# Patient Record
Sex: Male | Born: 1965 | Race: White | Hispanic: No | Smoking: Current every day smoker
Health system: Southern US, Community
[De-identification: ages and names within clinical notes are randomized; demographics above are authoritative.]

## PROBLEM LIST (undated history)

## (undated) DIAGNOSIS — I214 Non-ST elevation (NSTEMI) myocardial infarction: Secondary | ICD-10-CM

## (undated) DIAGNOSIS — Z9861 Coronary angioplasty status: Secondary | ICD-10-CM

## (undated) DIAGNOSIS — K219 Gastro-esophageal reflux disease without esophagitis: Secondary | ICD-10-CM

## (undated) DIAGNOSIS — F32A Depression, unspecified: Secondary | ICD-10-CM

## (undated) DIAGNOSIS — Z87442 Personal history of urinary calculi: Secondary | ICD-10-CM

## (undated) DIAGNOSIS — I739 Peripheral vascular disease, unspecified: Secondary | ICD-10-CM

## (undated) DIAGNOSIS — I2510 Atherosclerotic heart disease of native coronary artery without angina pectoris: Secondary | ICD-10-CM

## (undated) DIAGNOSIS — K649 Unspecified hemorrhoids: Secondary | ICD-10-CM

## (undated) DIAGNOSIS — E785 Hyperlipidemia, unspecified: Secondary | ICD-10-CM

## (undated) DIAGNOSIS — F329 Major depressive disorder, single episode, unspecified: Secondary | ICD-10-CM

## (undated) DIAGNOSIS — I10 Essential (primary) hypertension: Secondary | ICD-10-CM

## (undated) DIAGNOSIS — F419 Anxiety disorder, unspecified: Secondary | ICD-10-CM

## (undated) HISTORY — DX: Essential (primary) hypertension: I10

## (undated) HISTORY — PX: KIDNEY STONE SURGERY: SHX686

## (undated) HISTORY — DX: Hyperlipidemia, unspecified: E78.5

## (undated) HISTORY — DX: Anxiety disorder, unspecified: F41.9

## (undated) HISTORY — PX: ELBOW SURGERY: SHX618

## (undated) HISTORY — DX: Depression, unspecified: F32.A

## (undated) HISTORY — DX: Major depressive disorder, single episode, unspecified: F32.9

---

## 1998-08-04 ENCOUNTER — Encounter: Payer: Self-pay | Admitting: Emergency Medicine

## 1998-08-04 ENCOUNTER — Emergency Department (HOSPITAL_COMMUNITY): Admission: EM | Admit: 1998-08-04 | Discharge: 1998-08-04 | Payer: Self-pay | Admitting: Emergency Medicine

## 1998-09-30 ENCOUNTER — Ambulatory Visit (HOSPITAL_COMMUNITY): Admission: RE | Admit: 1998-09-30 | Discharge: 1998-09-30 | Payer: Self-pay | Admitting: Urology

## 1998-09-30 ENCOUNTER — Encounter: Payer: Self-pay | Admitting: Urology

## 2002-08-21 ENCOUNTER — Encounter: Payer: Self-pay | Admitting: Emergency Medicine

## 2002-08-21 ENCOUNTER — Encounter: Admission: RE | Admit: 2002-08-21 | Discharge: 2002-08-21 | Payer: Self-pay | Admitting: Emergency Medicine

## 2007-12-11 ENCOUNTER — Encounter: Admission: RE | Admit: 2007-12-11 | Discharge: 2007-12-11 | Payer: Self-pay | Admitting: Sports Medicine

## 2008-06-22 ENCOUNTER — Encounter: Admission: RE | Admit: 2008-06-22 | Discharge: 2008-06-22 | Payer: Self-pay | Admitting: Internal Medicine

## 2008-07-18 ENCOUNTER — Ambulatory Visit: Payer: Self-pay | Admitting: Diagnostic Radiology

## 2008-07-18 ENCOUNTER — Emergency Department (HOSPITAL_BASED_OUTPATIENT_CLINIC_OR_DEPARTMENT_OTHER): Admission: EM | Admit: 2008-07-18 | Discharge: 2008-07-18 | Payer: Self-pay | Admitting: Emergency Medicine

## 2008-07-19 ENCOUNTER — Ambulatory Visit: Payer: Self-pay | Admitting: Surgery

## 2008-07-20 ENCOUNTER — Ambulatory Visit (HOSPITAL_COMMUNITY): Admission: AD | Admit: 2008-07-20 | Discharge: 2008-07-20 | Payer: Self-pay | Admitting: Urology

## 2008-07-27 ENCOUNTER — Ambulatory Visit (HOSPITAL_BASED_OUTPATIENT_CLINIC_OR_DEPARTMENT_OTHER): Admission: RE | Admit: 2008-07-27 | Discharge: 2008-07-27 | Payer: Self-pay | Admitting: Urology

## 2008-09-24 ENCOUNTER — Ambulatory Visit (HOSPITAL_COMMUNITY): Payer: Self-pay | Admitting: Licensed Clinical Social Worker

## 2008-10-07 ENCOUNTER — Ambulatory Visit (HOSPITAL_COMMUNITY): Payer: Self-pay | Admitting: Licensed Clinical Social Worker

## 2008-10-12 ENCOUNTER — Ambulatory Visit (HOSPITAL_COMMUNITY): Payer: Self-pay | Admitting: Psychiatry

## 2008-11-17 ENCOUNTER — Ambulatory Visit (HOSPITAL_COMMUNITY): Payer: Self-pay | Admitting: Psychiatry

## 2009-01-12 ENCOUNTER — Ambulatory Visit (HOSPITAL_COMMUNITY): Payer: Self-pay | Admitting: Psychiatry

## 2009-06-08 ENCOUNTER — Ambulatory Visit (HOSPITAL_COMMUNITY): Payer: Self-pay | Admitting: Psychiatry

## 2009-09-30 ENCOUNTER — Ambulatory Visit (HOSPITAL_COMMUNITY): Payer: Self-pay | Admitting: Psychiatry

## 2009-12-21 ENCOUNTER — Emergency Department (HOSPITAL_COMMUNITY): Admission: EM | Admit: 2009-12-21 | Discharge: 2009-12-21 | Payer: Self-pay | Admitting: Emergency Medicine

## 2010-03-21 ENCOUNTER — Encounter (HOSPITAL_COMMUNITY): Payer: 59 | Admitting: Physician Assistant

## 2010-04-18 ENCOUNTER — Encounter (HOSPITAL_COMMUNITY): Payer: 59 | Admitting: Physician Assistant

## 2010-04-18 DIAGNOSIS — F329 Major depressive disorder, single episode, unspecified: Secondary | ICD-10-CM

## 2010-04-18 DIAGNOSIS — F411 Generalized anxiety disorder: Secondary | ICD-10-CM

## 2010-05-08 LAB — CBC
HCT: 42 % (ref 39.0–52.0)
Hemoglobin: 14.5 g/dL (ref 13.0–17.0)
MCHC: 34.5 g/dL (ref 30.0–36.0)
MCV: 88.3 fL (ref 78.0–100.0)
Platelets: 147 K/uL — ABNORMAL LOW (ref 150–400)
RBC: 4.75 MIL/uL (ref 4.22–5.81)
RDW: 12.9 % (ref 11.5–15.5)
WBC: 8 K/uL (ref 4.0–10.5)

## 2010-05-08 LAB — URINE CULTURE
Colony Count: NO GROWTH
Culture: NO GROWTH

## 2010-05-08 LAB — URINALYSIS, ROUTINE W REFLEX MICROSCOPIC
Bilirubin Urine: NEGATIVE
Glucose, UA: NEGATIVE mg/dL
Ketones, ur: 15 mg/dL — AB
Leukocytes, UA: NEGATIVE
Nitrite: NEGATIVE
Protein, ur: NEGATIVE mg/dL
Specific Gravity, Urine: 1.03 (ref 1.005–1.030)
Urobilinogen, UA: 1 mg/dL (ref 0.0–1.0)
pH: 5.5 (ref 5.0–8.0)

## 2010-05-08 LAB — URINE MICROSCOPIC-ADD ON

## 2010-06-13 NOTE — Op Note (Signed)
NAMEMAKEL, MCMANN                  ACCOUNT NO.:  [id number]   MEDICAL RECORD NO.:  [id number]          PATIENT TYPE:  AMB   LOCATION:  [facility]                         FACILITY:  [facility]   PHYSICIAN:  Excell Seltzer. Annabell Howells, M.D.    DATE OF BIRTH:  February 11, 1965   DATE OF PROCEDURE:  07/27/2008  DATE OF DISCHARGE:                               OPERATIVE REPORT   PROCEDURE:  Cystoscopy, removal of left double-J stent and left  ureteroscopic stone extraction.   PREOPERATIVE DIAGNOSIS:  Left distal ureteral stone.   POSTOPERATIVE DIAGNOSIS:  Left distal ureteral stone.   SURGEON:  Dr. Bjorn Pippin.   ANESTHESIA:  General.   SPECIMEN:  Stone.   COMPLICATIONS:  None.   INDICATIONS:  Tommy Malone is a 45 year old white male with a left distal  ureteral stone.  He underwent attempted ureteroscopy last week.  However, he was found to have a very tight distal stricture below the  stone that was difficult to bypass and eventually only a stent could be  placed.  He returns today for definitive procedure.   FINDINGS AND PROCEDURE:  He was taken to the operating room.  He was  given IV Cipro.  A general anesthetic was induced.  He was placed in  lithotomy position.  His perineum and genitalia were prepped with  Betadine solution.  He was prepped and draped in the usual sterile  fashion.  Cystoscopy was performed using the 22-French scope and 12  degrees lens.  Examination revealed a normal urethra.  The external  sphincter was intact.  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  Examination of bladder revealed stent  at the left ureteral orifice, a normal right ureteral orifice and an  otherwise unremarkable bladder.  The stent was grasped with grasping  forceps and pulled to the urethral meatus.  A Sensor wire was passed  through the stent to the kidney under fluoroscopic guidance and the  stent was removed.   The 6-French short ureteroscope was passed alongside the wire and  negotiated by the narrowed area that had hampered the procedure  previously, the stone was visualized.  It was grasped with a nitinol  basket and removed without difficulty.  It was not felt that replacement  stent was indicated.  The bladder was then drained.  The wire was  removed.  A B and O suppository was placed and he was given Toradol 30  mg IV.  He was taken down from the lithotomy position.  His anesthetic  was reversed.  He was removed to the recovery room in stable condition.  There were no complications.      Excell Seltzer. Annabell Howells, M.D.  Electronically Signed     JJW/MEDQ  D:  07/27/2008  T:  07/28/2008  Job:  [id number]

## 2010-06-13 NOTE — Assessment & Plan Note (Signed)
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   PRIMARY CARE PHYSICIAN:  Deirdre Peer. Polite, M.D.   REASON FOR VISIT:  Subclavian steal.   HISTORY:  This is a 45 year old gentleman I am seeing at the request of  Dr. 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  The patient was  found to have a carotid bruit and was sent for ultrasound in May.  Ultrasound revealed no evidence of carotid stenosis.  However, there was  partial reverse flow during systole in the left vertebral artery, which  could be indicative of a mild subclavian steal syndrome.  The patient  comes in today for further evaluation.  He denies having any symptoms.  Specifically, he denies left arm weakness with activity.  He denies  dizziness with activity of his left arm.  He has not had any TIA or  stroke-like symptoms.  He is fully active.  He does complain of some  pain in his right buttock and thigh at approximately 300 yards.  He does  have a history smoking but quit in July 2009.   PAST MEDICAL HISTORY:  1. Hypercholesterolemia.  2. Reflux disease.   PAST SURGICAL HISTORY:  1. Right elbow surgery.  2. Kidney stone removal.   FAMILY HISTORY:  Positive for cardiovascular disease at an early age in  his grandmother and in his father.   SOCIAL HISTORY:  He is married.  He does not smoke, quit July 2009.  Does not drink alcohol.   REVIEW OF SYSTEMS:  GENERAL:  Negative for fevers, chills, weight gain,  weight loss.  CARDIAC:  Negative.  PULMONARY:  Negative.  GI:  Negative.  GU:  Negative.  VASCULAR:  Positive for pain in his right leg with walking.  NEURO:  Negative.  ORTHO:  Negative.  PSYCH:  Negative.  ENT:  Negative.  HEME:  Negative.   MEDICATIONS:  Simvastatin, omeprazole, multivitamin and fish oil.   ALLERGIES:  None.   PHYSICAL EXAMINATION:   Vital signs:  Blood pressure in the left arm is  104/72, right arm 114/75, pulse is 91.  General:  Well-appearing, no  distress.  HEENT:  Normocephalic, atraumatic.  Pupils equal.  Sclerae  anicteric.  Neck:  Supple.  No JVD.  Bilateral carotid bruits.  Cardiovascular:  Regular rate and rhythm.  Pulmonary:  Lungs are clear  bilaterally.  Extremities:  I cannot feel a left brachial pulse.  He has  bounding right brachial pulse, he has palpable pedal pulses.  Neuro:  Cranial nerves II-XII are grossly intact.  Psych:  He is alert and  oriented x3.  Skin:  Without rash.   DIAGNOSTIC STUDIES:  The patient comes with duplex ultrasound which  reveals no evidence of carotid stenosis but possible reversal flow left  vertebral artery.   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   PLAN:  The patient clinically does not have any evidence of cerebral or  extremity ischemia and for that reason this is a problem that should be  managed expectantly.  Should the patient develop having weakness in his  arms with activity or should he have problems with balance or passing  out with left arm activity, these  would be the indications for  operation.  At this point in time, I would recommend continuing to  monitor this.  I have discussed all this with the patient.  Should he  need to have anything done or should he develop symptoms, he will  contact my office.   Jorge Ny, MD  Electronically Signed   VWB/MEDQ  D:  07/19/2008  T:  07/20/2008  Job:  Idelia Salm   cc:   Deirdre Peer. Polite, M.D.

## 2010-06-13 NOTE — Op Note (Signed)
Tommy Malone, Tommy Malone                  ACCOUNT NO.:  [id number]   MEDICAL RECORD NO.:  [id number]          PATIENT TYPE:  AMB   LOCATION:  DAY                          FACILITY:  [facility]   PHYSICIAN:  Martina Sinner, MD DATE OF BIRTH:  17-Apr-1965   DATE OF PROCEDURE:  07/20/2008  DATE OF DISCHARGE:  07/20/2008                               OPERATIVE REPORT   SURGEON:  Martina Sinner, MD   ASSISTANT:  Maretta Bees. Vonita Moss, M.D.   SURGERY:  1. Cystoscopy.  2. Left retrograde ureterogram.  3. Insertion of left ureteral stent.   DIAGNOSIS:  Left ureteral stone with colic.   Mr. Soulliere felt poorly this morning with a distal left ureteral stone.  His pain settled down, and he was having some chills, and Dr. Annabell Howells  thought it was best to put a stent in and hopefully do ureteroscopy and  leave him stent free.   The patient was prepped and draped in usual fashion.  A 21-French scope  was utilized.  Dr. Annabell Howells went over the risks, and I went over the  treatment plan.   Penile bulbar membranes and prostatic urethra were normal.  He had been  given preoperative antibiotics.  Inspection of the bladder was  completely normal.  There is no erythema.  There is no periureteral  edema.  He did have small ureteral orifices bilaterally.   I initially under fluoroscopic guidance tried to pass a central wire  gently past the distal left ureteral stone which is approximately 3.5  mm, 2 cm proximal to the left ureteral orifice.  The guide wire would  not pass.  I therefore tried to use a 6-French open-ended ureteral  catheter and do a gentle retrograde.  There was a sharp angle from  midline toward the patient's left, making it a little bit more difficult  to insert.  I did not want to cause edema.  I therefore switched to a  central wire.  At this point, Dr. Larey Dresser entered the room  planning potentially to do ureteroscopy.  For approximately 25 minutes,  Dr. Vonita Moss and I both tried to  insert a central wire past obstruction,  approximately 2 cm distal to the left ureteral orifice.  We were able to  engage using a central wire followed by the open-ended catheter, the  open-ended catheter in the distal ureter, and a retrograde was done that  showed basically no hydroureter and very minimal to no dilation of his  renal pelvis or calyces.   We were both very surprised that it was very difficult to pass a wire.  Dr. Vonita Moss then used 1 cc of lidocaine and instilled it into the  ureter.  It went very well.  We used a cone-tip catheter to do this.   Once again, I engaged the open-ended ureteral catheter into the distal  left ureter.  Finally, the central wire went up nicely past the stone.  We could not pass an open-ended ureteral catheter past it.  Fortunately,a 26 cm x 6 French double-J well-lubricated stent did easily  go past  and curled nicely in the left renal pelvis and into the bladder.  There was little to no volcano effect.   Both Dr. Vonita Moss and I thought that ureteroscopy, under the  circumstances with a tight ureter, was not appropriate.   The patient was sent to the recovery room.  He will be discharged home  to follow up with Dr. Annabell Howells in the next few days to have likely  ureteroscopy.  He does not tolerate stents well.  The patient will be  given analgesics, nausea medicine, and anticholinergics.  Will proceed  accordingly.  The bladder was emptied, and the patient was taken to the  recovery room.   RETROGRADE URETEROGRAM:  A retrograde ureterogram was done using  approximately 6 cc of contrast.  It was done with the above technique.  There is little to no hydroureter and dilatation upper pole calyx.  Fluoroscopy was also used to make sure that the stent was in good  position.  One hard copy was taken.           ______________________________  Martina Sinner, MD  Electronically Signed     SAM/MEDQ  D:  07/20/2008  T:  07/21/2008  Job:   [id number]

## 2011-02-08 ENCOUNTER — Other Ambulatory Visit: Payer: Self-pay | Admitting: Orthopedic Surgery

## 2011-02-08 DIAGNOSIS — M25552 Pain in left hip: Secondary | ICD-10-CM

## 2011-02-16 ENCOUNTER — Ambulatory Visit
Admission: RE | Admit: 2011-02-16 | Discharge: 2011-02-16 | Disposition: A | Discharge status: Home / Self Care | Payer: 59 | Source: Ambulatory Visit | Attending: Orthopedic Surgery | Admitting: Orthopedic Surgery

## 2011-02-16 DIAGNOSIS — M25552 Pain in left hip: Secondary | ICD-10-CM

## 2011-02-16 MED ORDER — IOHEXOL 180 MG/ML  SOLN
15.0000 mL | Freq: Once | INTRAMUSCULAR | Status: AC | PRN
  Administered 2011-02-16: 15 mL via INTRA_ARTICULAR

## 2011-04-28 ENCOUNTER — Emergency Department (HOSPITAL_COMMUNITY)
Admission: EM | Admit: 2011-04-28 | Discharge: 2011-04-28 | Disposition: A | Discharge status: Home / Self Care | Payer: 59 | Source: Home / Self Care

## 2011-04-28 ENCOUNTER — Encounter (HOSPITAL_COMMUNITY): Payer: Self-pay | Admitting: *Deleted

## 2011-04-28 DIAGNOSIS — K529 Noninfective gastroenteritis and colitis, unspecified: Secondary | ICD-10-CM

## 2011-04-28 DIAGNOSIS — K5289 Other specified noninfective gastroenteritis and colitis: Secondary | ICD-10-CM

## 2011-04-28 HISTORY — DX: Gastro-esophageal reflux disease without esophagitis: K21.9

## 2011-04-28 MED ORDER — ONDANSETRON HCL 4 MG PO TABS: 4.0000 mg | ORAL_TABLET | Freq: Four times a day (QID) | ORAL | Status: AC

## 2011-04-28 MED ORDER — ONDANSETRON 4 MG PO TBDP
4.0000 mg | ORAL_TABLET | Freq: Once | ORAL | Status: AC
  Administered 2011-04-28: 4 mg via ORAL

## 2011-04-28 MED ORDER — ONDANSETRON 4 MG PO TBDP
ORAL_TABLET | ORAL | Status: AC
  Filled 2011-04-28: qty 1

## 2011-04-28 NOTE — ED Provider Notes (Signed)
History     CSN: [id number]  Arrival date & time 04/28/11  4098   None     Chief Complaint  Patient presents with  . Nausea  . Emesis  . Diarrhea    (Consider location/radiation/quality/duration/timing/severity/associated sxs/prior treatment) HPI Comments: Pt with 2 episodes vomiting and 3 episodes diarrhea every day since 04/25/11.  Has been drinking coke and mountain dew.  This morning ate a biscuit.   Patient is a 46 y.o. male presenting with vomiting and diarrhea. The history is provided by the patient.  Emesis  This is a new problem. The current episode started more than 2 days ago. The problem occurs 2 to 4 times per day. The problem has not changed since onset.The emesis has an appearance of stomach contents. There has been no fever. Associated symptoms include diarrhea. Pertinent negatives include no abdominal pain, no chills and no fever.  Diarrhea The primary symptoms include nausea, vomiting and diarrhea. Primary symptoms do not include fever or abdominal pain. The illness began 3 to 5 days ago. The problem has not changed since onset. The illness does not include chills.    Past Medical History  Diagnosis Date  . Acid reflux   . Kidney stones     Past Surgical History  Procedure Date  . Elbow surgery   . Kidney stone surgery     History reviewed. No pertinent family history.  History  Substance Use Topics  . Smoking status: Current Everyday Smoker  . Smokeless tobacco: Not on file  . Alcohol Use: Yes      Review of Systems  Constitutional: Negative for fever and chills.  Gastrointestinal: Positive for nausea, vomiting and diarrhea. Negative for abdominal pain and blood in stool.    Allergies  Review of patient's allergies indicates no known allergies.  Home Medications   Current Outpatient Rx  Name Route Sig Dispense Refill  . OMEPRAZOLE 20 MG PO CPDR Oral Take 20 mg by mouth daily.    [name] ONDANSETRON HCL 4 MG PO TABS Oral Take 1 tablet (4 mg  total) by mouth every 6 (six) hours. 12 tablet 0    BP 123/81  Pulse 90  Temp(Src) 99.3 F (37.4 C) (Oral)  Resp 18  SpO2 100%  Physical Exam  Constitutional: He appears well-developed and well-nourished. No distress.  Cardiovascular: Normal rate and regular rhythm.   Pulmonary/Chest: Effort normal and breath sounds normal.  Abdominal: Soft. Bowel sounds are increased. There is no tenderness.  Skin: He is not diaphoretic.    ED Course  Procedures (including critical care time)  Labs Reviewed - No data to display No results found.   1. Gastroenteritis       MDM  Tolerated oral fluid trial.  Feels better after zofran.          Cathlyn Parsons, NP 04/28/11 2037

## 2011-04-28 NOTE — ED Provider Notes (Signed)
Medical screening examination/treatment/procedure(s) were performed by non-physician practitioner and as supervising physician I was immediately available for consultation/collaboration.  Alen Bleacher, MD 04/28/11 2052

## 2011-04-28 NOTE — Discharge Instructions (Signed)
Clear liquids only until 24 hours after the last time you vomited.  Start with small sips. After 24 hours, you can add bland foods, gradually adding foods back to your diet.  Use immodium for your diarrhea if needed as directed on the package.   Clear Liquid Diet The clear liquid dietconsists of foods that are liquid or will become liquid at room temperature.You should be able to see through the liquid and beverages. Examples of foods allowed on a clear liquid diet include fruit juice, broth or bouillon, gelatin, or frozen ice pops. The purpose of this diet is to provide necessary fluid, electrolytes such as sodium and potassium, and energy to keep the body functioning during times when you are not able to consume a regular diet.A clear liquid diet should not be continued for long periods of time as it is not nutritionally adequate.  REASONS FOR USING A CLEAR LIQUID DIET  In sudden onset (acute) conditions for a patient before or after surgery.   As the first step in oral feeding.   For fluid and electrolyte replacement in diarrheal diseases.   As a diet before certain medical tests are performed.  ADEQUACY The clear liquid diet is adequate only in ascorbic acid, according to the Recommended Dietary Allowances of the [employer]. CHOOSING FOODS Breads and Starches  Allowed:  None are allowed.   Avoid: All are avoided.  Vegetables  Allowed:  Strained tomato or vegetable juice.   Avoid: Any others.  Fruit  Allowed:  Strained fruit juices and fruit drinks. Include 1 serving of citrus or vitamin C-enriched fruit juice daily.   Avoid: Any others.  Meat and Meat Substitutes  Allowed:  None are allowed.   Avoid: All are avoided.  Milk  Allowed:  None are allowed.   Avoid: All are avoided.  Soups and Combination Foods  Allowed:  Clear bouillon, broth, or strained broth-based soups.   Avoid: Any others.  Desserts and Sweets  Allowed:  Sugar, honey. High  protein gelatin. Flavored gelatin, ices, or frozen ice pops that do not contain milk.   Avoid: Any others.  Fats and Oils  Allowed:  None are allowed.   Avoid: All are avoided.  Beverages  Allowed: Cereal beverages, coffee (regular or decaffeinated), tea, or soda at the discretion of your caregiver.   Avoid: Any others.  Condiments  Allowed:  Iodized salt.   Avoid: Any others, including pepper.  Supplements  Allowed:  Liquid nutrition beverages.   Avoid: Any others that contain lactose or fiber.  SAMPLE MEAL PLAN Breakfast  4 oz (120 mL) strained orange juice.    to 1 cup (125 to 250 mL) gelatin (plain or fortified).   1 cup (250 mL) beverage (coffee or tea).   Sugar, if desired.  Midmorning Snack   cup (125 mL) gelatin (plain or fortified).  Lunch  1 cup (250 mL) broth or consomm.   4 oz (120 mL) strained grapefruit juice.    cup (125 mL) gelatin (plain or fortified).   1 cup (250 mL) beverage (coffee or tea).   Sugar, if desired.  Midafternoon Snack   cup (125 mL) fruit ice.    cup (125 mL) strained fruit juice.  Dinner  1 cup (250 mL) broth or consomm.    cup (125 mL) cranberry juice.    cup (125 mL) flavored gelatin (plain or fortified).   1 cup (250 mL) beverage (coffee or tea).   Sugar, if desired.  Evening Snack  4 oz (120 mL) strained apple juice (vitamin C-fortified).    cup (125 mL) flavored gelatin (plain or fortified).  Document Released: 01/15/2005 Document Revised: 01/04/2011 Document Reviewed: 04/14/2010 [facility] Patient Information 2012 [place], [place].B.R.A.T. Diet Your doctor has recommended the B.R.A.T. diet for you or your child until the condition improves. This is often used to help control diarrhea and vomiting symptoms. If you or your child can tolerate clear liquids, you may have:  Bananas.   Rice.   Applesauce.   Toast (and other simple starches such as crackers, potatoes, noodles).  Be sure to avoid  dairy products, meats, and fatty foods until symptoms are better. Fruit juices such as apple, grape, and prune juice can make diarrhea worse. Avoid these. Continue this diet for 2 days or as instructed by your caregiver. Document Released: 01/15/2005 Document Revised: 01/04/2011 Document Reviewed: 07/04/2006 [facility] Patient Information 2012 [place], [place].Viral Gastroenteritis Viral gastroenteritis is also known as stomach flu. This condition affects the stomach and intestinal tract. It can cause sudden diarrhea and vomiting. The illness typically lasts 3 to 8 days. Most people develop an immune response that eventually gets rid of the virus. While this natural response develops, the virus can make you quite ill. CAUSES  Many different viruses can cause gastroenteritis, such as rotavirus or noroviruses. You can catch one of these viruses by consuming contaminated food or water. You may also catch a virus by sharing utensils or other personal items with an infected person or by touching a contaminated surface. SYMPTOMS  The most common symptoms are diarrhea and vomiting. These problems can cause a severe loss of body fluids (dehydration) and a body salt (electrolyte) imbalance. Other symptoms may include:  Fever.   Headache.   Fatigue.   Abdominal pain.  DIAGNOSIS  Your caregiver can usually diagnose viral gastroenteritis based on your symptoms and a physical exam. A stool sample may also be taken to test for the presence of viruses or other infections. TREATMENT  This illness typically goes away on its own. Treatments are aimed at rehydration. The most serious cases of viral gastroenteritis involve vomiting so severely that you are not able to keep fluids down. In these cases, fluids must be given through an intravenous line (IV). HOME CARE INSTRUCTIONS   Drink enough fluids to keep your urine clear or pale yellow. Drink small amounts of fluids frequently and increase the amounts as  tolerated.   Ask your caregiver for specific rehydration instructions.   Avoid:   Foods high in sugar.   Alcohol.   Carbonated drinks.   Tobacco.   Juice.   Caffeine drinks.   Extremely hot or cold fluids.   Fatty, greasy foods.   Too much intake of anything at one time.   Dairy products until 24 to 48 hours after diarrhea stops.   You may consume probiotics. Probiotics are active cultures of beneficial bacteria. They may lessen the amount and number of diarrheal stools in adults. Probiotics can be found in yogurt with active cultures and in supplements.   Wash your hands well to avoid spreading the virus.   Only take over-the-counter or prescription medicines for pain, discomfort, or fever as directed by your caregiver. Do not give aspirin to children. Antidiarrheal medicines are not recommended.   Ask your caregiver if you should continue to take your regular prescribed and over-the-counter medicines.   Keep all follow-up appointments as directed by your caregiver.  SEEK IMMEDIATE MEDICAL CARE IF:   You are unable to  keep fluids down.   You do not urinate at least once every 6 to 8 hours.   You develop shortness of breath.   You notice blood in your stool or vomit. This may look like coffee grounds.   You have abdominal pain that increases or is concentrated in one small area (localized).   You have persistent vomiting or diarrhea.   You have a fever.   The patient is a child younger than 3 months, and he or she has a fever.   The patient is a child older than 3 months, and he or she has a fever and persistent symptoms.   The patient is a child older than 3 months, and he or she has a fever and symptoms suddenly get worse.   The patient is a baby, and he or she has no tears when crying.  MAKE SURE YOU:   Understand these instructions.   Will watch your condition.   Will get help right away if you are not doing well or get worse.  Document Released:  01/15/2005 Document Revised: 01/04/2011 Document Reviewed: 11/01/2010 [facility] Patient Information 2012 [place], [place].

## 2011-04-28 NOTE — ED Notes (Signed)
Sipping on  Ice water

## 2011-04-28 NOTE — ED Notes (Signed)
Pt with onset of nausea vomiting and diarrhea Wednesday evening - last vomited approx 2 hours ago - diarrhea last episode approx 3 - 4 hours

## 2011-04-30 ENCOUNTER — Ambulatory Visit (INDEPENDENT_AMBULATORY_CARE_PROVIDER_SITE_OTHER): Payer: 59 | Admitting: Internal Medicine

## 2011-04-30 VITALS — BP 122/86 | HR 70 | Temp 97.8°F | Resp 18 | Ht 68.5 in | Wt 160.6 lb

## 2011-04-30 DIAGNOSIS — J40 Bronchitis, not specified as acute or chronic: Secondary | ICD-10-CM

## 2011-04-30 DIAGNOSIS — J329 Chronic sinusitis, unspecified: Secondary | ICD-10-CM

## 2011-04-30 DIAGNOSIS — F17200 Nicotine dependence, unspecified, uncomplicated: Secondary | ICD-10-CM

## 2011-04-30 MED ORDER — IPRATROPIUM BROMIDE 0.06 % NA SOLN: 2.0000 | Freq: Four times a day (QID) | NASAL | Status: DC

## 2011-04-30 MED ORDER — AZITHROMYCIN 250 MG PO TABS: ORAL_TABLET | ORAL | Status: AC

## 2011-04-30 NOTE — Patient Instructions (Signed)

## 2011-04-30 NOTE — Progress Notes (Signed)
  Subjective:    Patient ID: Tommy Malone, male    DOB: 1965-03-19, 46 y.o.   MRN: [id number]  Sinusitis This is a new problem. The current episode started yesterday. The problem has been gradually worsening since onset. There has been no fever. Associated symptoms include coughing, a hoarse voice and sinus pressure. Pertinent negatives include no chills, ear pain, sneezing, sore throat or swollen glands.  Tommy Malone is here with sinus congestion and cough that started 2 days ago.  He was seen last week for a viral stomach illness and given supportive care but no IV hydration or antibiotics.  He feels he has a sinus infection and has pressure in his sinuses with lots of drainage.  He smokes and quit once for 16 months.  Works for [employer].    Review of Systems  Constitutional: Negative for chills.  HENT: Positive for hoarse voice and sinus pressure. Negative for ear pain, sore throat and sneezing.   Respiratory: Positive for cough.   All other systems reviewed and are negative.       Objective:   Physical Exam  Vitals reviewed. Constitutional: He is oriented to person, place, and time. He appears well-developed and well-nourished.  HENT:  Head: Normocephalic.  Right Ear: External ear normal.  Left Ear: External ear normal.  Mouth/Throat: No oropharyngeal exudate.  Eyes: Conjunctivae are normal.  Neck: Neck supple.  Cardiovascular: Normal rate, regular rhythm and normal heart sounds.   Pulmonary/Chest: He has no wheezes. He has no rales. He exhibits no tenderness.       rhonchii in left lung fields.  Lymphadenopathy:    He has no cervical adenopathy.  Neurological: He is alert and oriented to person, place, and time.  Skin: Skin is warm and dry.  Psychiatric: He has a normal mood and affect. His behavior is normal.          Assessment & Plan:  Sinusitis/Bronchitis:   Zpack and Atrovent Nasal Spray.  Quit Smoking!  He promises to think about it.  AVS printed and given pt.

## 2012-04-03 ENCOUNTER — Ambulatory Visit (INDEPENDENT_AMBULATORY_CARE_PROVIDER_SITE_OTHER): Payer: 59 | Admitting: Physician Assistant

## 2012-04-03 VITALS — BP 110/80 | HR 68 | Temp 97.7°F | Resp 16 | Ht 69.0 in | Wt 162.8 lb

## 2012-04-03 DIAGNOSIS — J309 Allergic rhinitis, unspecified: Secondary | ICD-10-CM

## 2012-04-03 DIAGNOSIS — J329 Chronic sinusitis, unspecified: Secondary | ICD-10-CM

## 2012-04-03 MED ORDER — AMOXICILLIN-POT CLAVULANATE 875-125 MG PO TABS: 1.0000 | ORAL_TABLET | Freq: Two times a day (BID) | ORAL | Status: DC

## 2012-04-03 MED ORDER — FLUTICASONE PROPIONATE 50 MCG/ACT NA SUSP: 2.0000 | Freq: Every day | NASAL | Status: DC

## 2012-04-03 MED ORDER — IPRATROPIUM BROMIDE 0.03 % NA SOLN: 2.0000 | Freq: Two times a day (BID) | NASAL | Status: DC

## 2012-04-03 NOTE — Progress Notes (Signed)
  Subjective:    Patient ID: Tommy Malone, male    DOB: Apr 02, 1965, 47 y.o.   MRN: [id number]  HPI   Tommy Malone is a pleasant 47 yr old male here with concern for sinus infection.  States he has had symptoms for about 3-4 weeks.  Started as URI, but now with persistent sinus pressure, nasal congestion, and nasal drainage.  Pain is predominantly over left eye.  Feels lots of pressure there when he moves his head.  Some cough,but pt is a 1ppd smoker.  Denies GI symptoms.  Has fibromyalgia, so has body aches at baseline.  No fever or chills.  Has been using his neti pot consistently.  OTC cold preps are providing some relief, but not completely resolving symptoms.     Review of Systems  Constitutional: Negative for fever and chills.  HENT: Positive for congestion and sinus pressure.   Respiratory: Positive for cough. Negative for shortness of breath and wheezing.   Cardiovascular: Negative.   Gastrointestinal: Negative.   Musculoskeletal: Negative.   Skin: Negative.   Allergic/Immunologic: Positive for environmental allergies.  Neurological: Negative.        Objective:   Physical Exam  Vitals reviewed. Constitutional: He is oriented to person, place, and time. He appears well-developed and well-nourished.  HENT:  Head: Normocephalic and atraumatic.  Right Ear: Tympanic membrane and ear canal normal.  Left Ear: Tympanic membrane and ear canal normal.  Nose: Right sinus exhibits no maxillary sinus tenderness and no frontal sinus tenderness. Left sinus exhibits frontal sinus tenderness. Left sinus exhibits no maxillary sinus tenderness.  Mouth/Throat: Uvula is midline, oropharynx is clear and moist and mucous membranes are normal.  Eyes: Conjunctivae are normal. No scleral icterus.  Neck: Neck supple.  Cardiovascular: Normal rate, regular rhythm and normal heart sounds.  Exam reveals no gallop and no friction rub.   No murmur heard. Pulmonary/Chest: Effort normal and breath sounds normal. He  has no wheezes. He has no rales.  Lymphadenopathy:    He has no cervical adenopathy.  Neurological: He is alert and oriented to person, place, and time.  Skin: Skin is warm and dry.  Psychiatric: He has a normal mood and affect. His behavior is normal.     Filed Vitals:   04/03/12 1138  BP: 110/80  Pulse: 68  Temp: 97.7 F (36.5 C)  Resp: 16        Assessment & Plan:  Sinusitis - Plan: amoxicillin-clavulanate (AUGMENTIN) 875-125 MG per tablet, ipratropium (ATROVENT) 0.03 % nasal spray, fluticasone (FLONASE) 50 MCG/ACT nasal spray  Allergic rhinitis - Plan: fluticasone (FLONASE) 50 MCG/ACT nasal spray   Tommy Malone is a pleasant 47 yr old male with sinusitis.  Symptoms have been present for 3-4 weeks, primarily pain/pressure over left frontal sinus.  TTP over left frontal sinus.  Will treat with amox/clav x 10 days.  Atrovent for nasal congestion.  Will also start Flonase for allergy control.  Discussed RTC precautions with pt who understands.   Briefly discussed smoking cessation.  Pt has successfully quit using Chantix before.  Resume smoking after 16 months, now smoking 1ppd.  No interest in quitting at this time.

## 2012-04-03 NOTE — Patient Instructions (Addendum)
Begin taking the antibiotics as directed.  Be sure to finish the full course.  Take with food to reduce stomach upset.  Continue using the neti pot or nasal saline.  Also begin using the Flonase twice daily - this can be continued long term for allergy control if you are seeing good results.  Atrovent nasal spray twice daily for relief of congestion (can use with Flonase, just separate dosing by about 20 minutes to get the full effect of both).  Plenty of fluids and rest.  Let us know if you are worsening or not improving.   Sinusitis Sinusitis is redness, soreness, and swelling (inflammation) of the paranasal sinuses. Paranasal sinuses are air pockets within the bones of your face (beneath the eyes, the middle of the forehead, or above the eyes). In healthy paranasal sinuses, mucus is able to drain out, and air is able to circulate through them by way of your nose. However, when your paranasal sinuses are inflamed, mucus and air can become trapped. This can allow bacteria and other germs to grow and cause infection. Sinusitis can develop quickly and last only a short time (acute) or continue over a long period (chronic). Sinusitis that lasts for more than 12 weeks is considered chronic.  CAUSES  Causes of sinusitis include:  Allergies.  Structural abnormalities, such as displacement of the cartilage that separates your nostrils (deviated septum), which can decrease the air flow through your nose and sinuses and affect sinus drainage.  Functional abnormalities, such as when the small hairs (cilia) that line your sinuses and help remove mucus do not work properly or are not present. SYMPTOMS  Symptoms of acute and chronic sinusitis are the same. The primary symptoms are pain and pressure around the affected sinuses. Other symptoms include:  Upper toothache.  Earache.  Headache.  Bad breath.  Decreased sense of smell and taste.  A cough, which worsens when you are lying  flat.  Fatigue.  Fever.  Thick drainage from your nose, which often is green and may contain pus (purulent).  Swelling and warmth over the affected sinuses. DIAGNOSIS  Your caregiver will perform a physical exam. During the exam, your caregiver may:  Look in your nose for signs of abnormal growths in your nostrils (nasal polyps).  Tap over the affected sinus to check for signs of infection.  View the inside of your sinuses (endoscopy) with a special imaging device with a light attached (endoscope), which is inserted into your sinuses. If your caregiver suspects that you have chronic sinusitis, one or more of the following tests may be recommended:  Allergy tests.  Nasal culture A sample of mucus is taken from your nose and sent to a lab and screened for bacteria.  Nasal cytology A sample of mucus is taken from your nose and examined by your caregiver to determine if your sinusitis is related to an allergy. TREATMENT  Most cases of acute sinusitis are related to a viral infection and will resolve on their own within 10 days. Sometimes medicines are prescribed to help relieve symptoms (pain medicine, decongestants, nasal steroid sprays, or saline sprays).  However, for sinusitis related to a bacterial infection, your caregiver will prescribe antibiotic medicines. These are medicines that will help kill the bacteria causing the infection.  Rarely, sinusitis is caused by a fungal infection. In theses cases, your caregiver will prescribe antifungal medicine. For some cases of chronic sinusitis, surgery is needed. Generally, these are cases in which sinusitis recurs more than 3 times  per year, despite other treatments. HOME CARE INSTRUCTIONS   Drink plenty of water. Water helps thin the mucus so your sinuses can drain more easily.  Use a humidifier.  Inhale steam 3 to 4 times a day (for example, sit in the bathroom with the shower running).  Apply a warm, moist washcloth to your face 3  to 4 times a day, or as directed by your caregiver.  Use saline nasal sprays to help moisten and clean your sinuses.  Take over-the-counter or prescription medicines for pain, discomfort, or fever only as directed by your caregiver. SEEK IMMEDIATE MEDICAL CARE IF:  You have increasing pain or severe headaches.  You have nausea, vomiting, or drowsiness.  You have swelling around your face.  You have vision problems.  You have a stiff neck.  You have difficulty breathing. MAKE SURE YOU:   Understand these instructions.  Will watch your condition.  Will get help right away if you are not doing well or get worse. Document Released: 01/15/2005 Document Revised: 04/09/2011 Document Reviewed: 01/30/2011 [facility] Patient Information 2013 [place], [place].

## 2012-04-09 ENCOUNTER — Other Ambulatory Visit: Payer: Self-pay | Admitting: Pain Medicine

## 2012-04-09 DIAGNOSIS — M25559 Pain in unspecified hip: Secondary | ICD-10-CM

## 2012-04-09 DIAGNOSIS — M79605 Pain in left leg: Secondary | ICD-10-CM

## 2012-04-09 DIAGNOSIS — M545 Low back pain: Secondary | ICD-10-CM

## 2012-04-19 ENCOUNTER — Other Ambulatory Visit: Payer: 59

## 2012-04-20 ENCOUNTER — Telehealth: Payer: Self-pay

## 2012-04-20 NOTE — Telephone Encounter (Signed)
Is this ok?

## 2012-04-20 NOTE — Telephone Encounter (Signed)
Pt states that his job is requiring him to have a note to return back to work after he left work early this past Friday due to having a severe sinus headache. Pt states that he was seen two weeks ago but unfortunatly he could not afford the rx written for antibiotics so he had to buy it a week after his visit.    Note must: -state that he is eligible to come back to work tommorow   Best# [phone]

## 2012-04-20 NOTE — Telephone Encounter (Signed)
Ok to write letter

## 2012-04-20 NOTE — Telephone Encounter (Signed)
Done Tommy Malone will call to tell pt to pick up.

## 2012-04-25 ENCOUNTER — Other Ambulatory Visit: Payer: 59

## 2012-05-13 ENCOUNTER — Ambulatory Visit: Payer: 59 | Admitting: Cardiovascular Disease

## 2012-07-15 ENCOUNTER — Encounter: Payer: Self-pay | Admitting: Cardiovascular Disease

## 2012-09-04 ENCOUNTER — Encounter: Payer: Self-pay | Admitting: Cardiovascular Disease

## 2012-11-03 ENCOUNTER — Other Ambulatory Visit: Payer: Self-pay | Admitting: *Deleted

## 2012-11-03 DIAGNOSIS — I70219 Atherosclerosis of native arteries of extremities with intermittent claudication, unspecified extremity: Secondary | ICD-10-CM

## 2012-11-21 ENCOUNTER — Encounter: Payer: 59 | Admitting: Vascular Surgery

## 2012-11-21 ENCOUNTER — Other Ambulatory Visit (HOSPITAL_COMMUNITY): Payer: 59

## 2012-12-04 ENCOUNTER — Encounter: Payer: Self-pay | Admitting: Vascular Surgery

## 2012-12-05 ENCOUNTER — Encounter: Payer: Self-pay | Admitting: Vascular Surgery

## 2012-12-05 ENCOUNTER — Ambulatory Visit (HOSPITAL_COMMUNITY)
Admission: RE | Admit: 2012-12-05 | Discharge: 2012-12-05 | Disposition: A | Discharge status: Home / Self Care | Payer: PRIVATE HEALTH INSURANCE | Source: Ambulatory Visit | Attending: Vascular Surgery | Admitting: Vascular Surgery

## 2012-12-05 ENCOUNTER — Encounter (INDEPENDENT_AMBULATORY_CARE_PROVIDER_SITE_OTHER): Payer: Self-pay

## 2012-12-05 ENCOUNTER — Encounter (HOSPITAL_COMMUNITY): Payer: Self-pay | Admitting: Pharmacy Technician

## 2012-12-05 ENCOUNTER — Ambulatory Visit (INDEPENDENT_AMBULATORY_CARE_PROVIDER_SITE_OTHER): Payer: PRIVATE HEALTH INSURANCE | Admitting: Vascular Surgery

## 2012-12-05 ENCOUNTER — Other Ambulatory Visit: Payer: Self-pay

## 2012-12-05 VITALS — BP 133/83 | HR 51 | Resp 14 | Ht 69.0 in | Wt 160.0 lb

## 2012-12-05 DIAGNOSIS — M79609 Pain in unspecified limb: Secondary | ICD-10-CM

## 2012-12-05 DIAGNOSIS — I70219 Atherosclerosis of native arteries of extremities with intermittent claudication, unspecified extremity: Secondary | ICD-10-CM

## 2012-12-05 NOTE — Progress Notes (Signed)
[facility] [place]  Referred by:  Ronald D Polite, MD 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 (11/12/1965) male who presents with chief complaint: Bilateral leg pain.  Onset of symptom occurred 7 years ago.  Pain is described as aching in thighs then calf with ambulation, severity 3-6/10, and associated with ambulation.  Patient has attempted to treat this pain with multiple other interventions.  The patient has no rest pain symptoms also and no leg wounds/ulcers.  Atherosclerotic risk factors include: heavy smoking, family history of accelerated atherosclerosis.  Pt has also been diagnosed with L subclavian steal and erectile dysfunction.  Past Medical History  Diagnosis Date  . Acid reflux   . Kidney stones   . Depression   . Anxiety    L subclavian steal   Erectile dysfunction.  Past Surgical History  Procedure Laterality Date  . Elbow surgery    . Kidney stone surgery      History   Social History  . Marital Status: Legally Separated    Spouse Name: N/A    Number of Children: N/A  . Years of Education: N/A   Occupational History  . Not on file.   Social History Main Topics  . Smoking status: Current Every Day Smoker -- 1.00 packs/day for 30 years    Types: Cigarettes  . Smokeless tobacco: Never Used  . Alcohol Use: No     Comment: occasional  . Drug Use: No  . Sexual Activity: Not on file   Other Topics Concern  . Not on file   Social History Narrative  . No narrative on file    Family History  Problem Relation Age of Onset  . Heart disease Mother   . Heart attack Mother   . Hyperlipidemia Mother   . Heart disease Maternal Grandmother   . Cancer Paternal Grandfather   . Heart disease Father   . Heart attack Father   . Hyperlipidemia Father     Current Outpatient Prescriptions on File Prior to Visit  Medication Sig Dispense  Refill  . clonazePAM (KLONOPIN) 0.5 MG tablet Take 0.5 mg by mouth 2 (two) times daily as needed for anxiety.      . omeprazole (PRILOSEC) 20 MG capsule Take 20 mg by mouth daily.      . PRESCRIPTION MEDICATION Ambilify 2 mg 1 tablet every morning      . sertraline (ZOLOFT) 100 MG tablet Take 100 mg by mouth daily.      . amoxicillin-clavulanate (AUGMENTIN) 875-125 MG per tablet Take 1 tablet by mouth 2 (two) times daily.  20 tablet  0  . baclofen (LIORESAL) 10 MG tablet Take 10 mg by mouth 3 (three) times daily.      . DULoxetine (CYMBALTA) 60 MG capsule Take 60 mg by mouth daily.      . fluticasone (FLONASE) 50 MCG/ACT nasal spray Place 2 sprays into the nose daily.  16 g  3  . ipratropium (ATROVENT) 0.03 % nasal spray Place 2 sprays into the nose 2 (two) times daily.  30 mL  1  . pregabalin (LYRICA) 50 MG capsule Take 50 mg by mouth 3 (three) times daily.       No current facility-administered medications on file prior to visit.    No Known Allergies  REVIEW OF SYSTEMS:  (Positives checked otherwise negative)  CARDIOVASCULAR:  [ ]   chest pain, [ ] chest pressure, [ ] palpitations, [ ] shortness of breath when laying flat, [ ] shortness of breath with exertion,   [x] pain in feet when walking, [ ] pain in feet when laying flat, [ ] history of blood clot in veins (DVT), [ ] history of phlebitis, [ ] swelling in legs, [ ] varicose veins  PULMONARY:  [ ] productive cough, [ ] asthma, [ ] wheezing  NEUROLOGIC:  [ ] weakness in arms or legs, [ ] numbness in arms or legs, [ ] difficulty speaking or slurred speech, [ ] temporary loss of vision in one eye, [ ] dizziness  HEMATOLOGIC:  [ ] bleeding problems, [ ] problems with blood clotting too easily  MUSCULOSKEL:  [ ] joint pain, [ ] joint swelling  GASTROINTEST:  [ ]  Vomiting blood, [ ]  Blood in stool     GENITOURINARY:  [ ]  Burning with urination, [ ]  Blood in urine  PSYCHIATRIC:  [x] history of major depression  INTEGUMENTARY:  [  ] rashes, [ ] ulcers  CONSTITUTIONAL:  [ ] fever, [ ] chills  For VQI Use Only  PRE-ADM LIVING: Home  AMB STATUS: Ambulatory  CAD Sx: None  PRIOR CHF: None  STRESS TEST: [x] No, [ ] Normal, [ ] + ischemia, [ ] + MI, [ ] Both  Physical Examination Filed Vitals:   12/05/12 1259  BP: 133/83  Pulse: 51  Resp: 14  Height: 5' 9" (1.753 m)  Weight: 160 lb (72.576 kg)  SpO2: 99%   Body mass index is 23.62 kg/(m^2).  General: A&O x 3, WDWN  Head: [place]/AT  Ear/Nose/Throat: Hearing grossly intact, nares w/o erythema or drainage, oropharynx w/o Erythema/Exudate  Eyes: PERRLA, EOMI  Neck: Supple, no nuchal rigidity, no palpable LAD  Pulmonary: Sym exp, good air movt, CTAB, no rales, rhonchi, & wheezing  Cardiac: RRR, Nl S1, S2, no Murmurs, rubs or gallops  Vascular: Vessel Right Left  Radial Palpable Palpable  Brachial  Palpable Palpable  Carotid Palpable, without bruit Palpable, without bruit  Aorta Not palpable N/A  Femoral Not Palpable Weakly Palpable  Popliteal Not palpable Not palpable  PT Not Palpable Not Palpable  DP Not Palpable Not Palpable   Gastrointestinal: soft, NTND, -G/R, - HSM, - masses, - CVAT B  Musculoskeletal: M/S 5/5 throughout , Extremities without ischemic changes , no ulcers or gangrene  Neurologic: CN 2-12 intact , Pain and light touch intact in extremities , Motor exam as listed above  Psychiatric: Judgment intact, Mood & affect appropriatefor pt's clinical situation  Dermatologic: See M/S exam for extremity exam, no rashes otherwise noted  Lymph : No Cervical, Axillary, or Inguinal lymphadenopathy   Non-Invasive Vascular Imaging  Outside ABI (Date: 10/27/12)  R: 0.65  L: 0.79  BLE arterial duplex (12/05/2012)  Mild diffuse disease bilaterals with patent arteries  Monophasic inflow c/w iliac disease  Medical Decision Making  Tommy Malone is a 47 y.o. 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  The duplex of R leg is concerning already for near occlusion on that side.    The L subclavian steal also points to likely atherosclerotic involvement of multiple vascular beds.  Given the severity of the sx and findings, I would proceed with Aortogram, bilateral leg runoff, and possible intervention,   scheduled for 20 NOV 14.  I discussed with the patient the natural history of intermittent claudication: 75% of patients have stable or improved symptoms in a year an only 2% require amputation. Eventually 20% may require intervention in a year.  I discussed in depth with the patient the nature of atherosclerosis, and emphasized the importance of maximal medical management including strict control of blood pressure, blood glucose, and lipid levels, antiplatelet agent, obtaining regular exercise, and cessation of smoking.    The patient is aware that without maximal medical management the underlying atherosclerotic disease process will progress, limiting the benefit of any interventions.  I discussed in depth with the patient a walking plan and how to execute such. The patient is currently not on a statin.  I suspect his lipid panel to be consistent with hyperlipidemia.  I will start the patient on Lipitor after his procedure. The patient is currently not on an anti-platelet.  The patient will be started on Plavix 75 mg PO daily after his procedure.  Thank you for allowing us to participate in this patient's care.  Brenner Visconti, MD [facility] Office: [phone] Pager: [phone]  12/05/2012, 1:22 PM    

## 2012-12-12 ENCOUNTER — Encounter: Payer: Self-pay | Admitting: Internal Medicine

## 2012-12-18 ENCOUNTER — Encounter (HOSPITAL_COMMUNITY)
Admission: RE | Disposition: A | Discharge status: Home / Self Care | Payer: Self-pay | Source: Ambulatory Visit | Attending: Vascular Surgery

## 2012-12-18 ENCOUNTER — Other Ambulatory Visit: Payer: Self-pay | Admitting: *Deleted

## 2012-12-18 ENCOUNTER — Ambulatory Visit (HOSPITAL_COMMUNITY)
Admission: RE | Admit: 2012-12-18 | Discharge: 2012-12-18 | Disposition: A | Discharge status: Home / Self Care | Payer: PRIVATE HEALTH INSURANCE | Source: Ambulatory Visit | Attending: Vascular Surgery | Admitting: Vascular Surgery

## 2012-12-18 DIAGNOSIS — F17200 Nicotine dependence, unspecified, uncomplicated: Secondary | ICD-10-CM | POA: Insufficient documentation

## 2012-12-18 DIAGNOSIS — I70219 Atherosclerosis of native arteries of extremities with intermittent claudication, unspecified extremity: Secondary | ICD-10-CM

## 2012-12-18 DIAGNOSIS — I739 Peripheral vascular disease, unspecified: Secondary | ICD-10-CM

## 2012-12-18 DIAGNOSIS — I708 Atherosclerosis of other arteries: Secondary | ICD-10-CM | POA: Insufficient documentation

## 2012-12-18 DIAGNOSIS — N529 Male erectile dysfunction, unspecified: Secondary | ICD-10-CM | POA: Insufficient documentation

## 2012-12-18 HISTORY — PX: ABDOMINAL AORTAGRAM: SHX5454

## 2012-12-18 LAB — POCT I-STAT, CHEM 8
BUN: 15 mg/dL (ref 6–23)
Chloride: 104 mEq/L (ref 96–112)
Creatinine, Ser: 1.3 mg/dL (ref 0.50–1.35)
Glucose, Bld: 84 mg/dL (ref 70–99)
HCT: 50 % (ref 39.0–52.0)
Hemoglobin: 17 g/dL (ref 13.0–17.0)
Potassium: 3.8 mEq/L (ref 3.5–5.1)

## 2012-12-18 SURGERY — ABDOMINAL AORTAGRAM
Anesthesia: LOCAL

## 2012-12-18 MED ORDER — ATORVASTATIN CALCIUM 10 MG PO TABS: 10.0000 mg | ORAL_TABLET | Freq: Every day | ORAL | Status: DC

## 2012-12-18 MED ORDER — ONDANSETRON HCL 4 MG/2ML IJ SOLN: 4.0000 mg | Freq: Four times a day (QID) | INTRAMUSCULAR | Status: DC | PRN

## 2012-12-18 MED ORDER — LIDOCAINE HCL (PF) 1 % IJ SOLN
INTRAMUSCULAR | Status: AC
  Filled 2012-12-18: qty 30

## 2012-12-18 MED ORDER — SODIUM CHLORIDE 0.9 % IV SOLN: 1.0000 mL/kg/h | INTRAVENOUS | Status: DC

## 2012-12-18 MED ORDER — MIDAZOLAM HCL 2 MG/2ML IJ SOLN
INTRAMUSCULAR | Status: AC
  Filled 2012-12-18: qty 2

## 2012-12-18 MED ORDER — HEPARIN (PORCINE) IN NACL 2-0.9 UNIT/ML-% IJ SOLN
INTRAMUSCULAR | Status: AC
  Filled 2012-12-18: qty 1000

## 2012-12-18 MED ORDER — SODIUM CHLORIDE 0.9 % IV SOLN: INTRAVENOUS | Status: DC

## 2012-12-18 MED ORDER — ACETAMINOPHEN 325 MG PO TABS: 650.0000 mg | ORAL_TABLET | ORAL | Status: DC | PRN

## 2012-12-18 MED ORDER — FENTANYL CITRATE 0.05 MG/ML IJ SOLN
INTRAMUSCULAR | Status: AC
  Filled 2012-12-18: qty 2

## 2012-12-18 NOTE — H&P (View-Only) (Signed)
[facility] [place]  Referred by:  Katy Apo, MD 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 (09-Mar-1965) male who presents with chief complaint: Bilateral leg pain.  Onset of symptom occurred 7 years ago.  Pain is described as aching in thighs then calf with ambulation, severity 3-6/10, and associated with ambulation.  Patient has attempted to treat this pain with multiple other interventions.  The patient has no rest pain symptoms also and no leg wounds/ulcers.  Atherosclerotic risk factors include: heavy smoking, family history of accelerated atherosclerosis.  Pt has also been diagnosed with L subclavian steal and erectile dysfunction.  Past Medical History  Diagnosis Date  . Acid reflux   . Kidney stones   . Depression   . Anxiety    L subclavian steal   Erectile dysfunction.  Past Surgical History  Procedure Laterality Date  . Elbow surgery    . Kidney stone surgery      History   Social History  . Marital Status: Legally Separated    Spouse Name: N/A    Number of Children: N/A  . Years of Education: N/A   Occupational History  . Not on file.   Social History Main Topics  . Smoking status: Current Every Day Smoker -- 1.00 packs/day for 30 years    Types: Cigarettes  . Smokeless tobacco: Never Used  . Alcohol Use: No     Comment: occasional  . Drug Use: No  . Sexual Activity: Not on file   Other Topics Concern  . Not on file   Social History Narrative  . No narrative on file    Family History  Problem Relation Age of Onset  . Heart disease Mother   . Heart attack Mother   . Hyperlipidemia Mother   . Heart disease Maternal Grandmother   . Cancer Paternal Grandfather   . Heart disease Father   . Heart attack Father   . Hyperlipidemia Father     Current Outpatient Prescriptions on File Prior to Visit  Medication Sig Dispense  Refill  . clonazePAM (KLONOPIN) 0.5 MG tablet Take 0.5 mg by mouth 2 (two) times daily as needed for anxiety.      [name] omeprazole (PRILOSEC) 20 MG capsule Take 20 mg by mouth daily.      [name] PRESCRIPTION MEDICATION Ambilify 2 mg 1 tablet every morning      . sertraline (ZOLOFT) 100 MG tablet Take 100 mg by mouth daily.      [name] amoxicillin-clavulanate (AUGMENTIN) 875-125 MG per tablet Take 1 tablet by mouth 2 (two) times daily.  20 tablet  0  . baclofen (LIORESAL) 10 MG tablet Take 10 mg by mouth 3 (three) times daily.      . DULoxetine (CYMBALTA) 60 MG capsule Take 60 mg by mouth daily.      . fluticasone (FLONASE) 50 MCG/ACT nasal spray Place 2 sprays into the nose daily.  16 g  3  . ipratropium (ATROVENT) 0.03 % nasal spray Place 2 sprays into the nose 2 (two) times daily.  30 mL  1  . pregabalin (LYRICA) 50 MG capsule Take 50 mg by mouth 3 (three) times daily.       No current facility-administered medications on file prior to visit.    No Known Allergies  REVIEW OF SYSTEMS:  (Positives checked otherwise negative)  CARDIOVASCULAR:  [ ]   chest pain, [ ]  chest pressure, [ ]  palpitations, [ ]  shortness of breath when laying flat, [ ]  shortness of breath with exertion,   [x]  pain in feet when walking, [ ]  pain in feet when laying flat, [ ]  history of blood clot in veins (DVT), [ ]  history of phlebitis, [ ]  swelling in legs, [ ]  varicose veins  PULMONARY:  [ ]  productive cough, [ ]  asthma, [ ]  wheezing  NEUROLOGIC:  [ ]  weakness in arms or legs, [ ]  numbness in arms or legs, [ ]  difficulty speaking or slurred speech, [ ]  temporary loss of vision in one eye, [ ]  dizziness  HEMATOLOGIC:  [ ]  bleeding problems, [ ]  problems with blood clotting too easily  MUSCULOSKEL:  [ ]  joint pain, [ ]  joint swelling  GASTROINTEST:  [ ]   Vomiting blood, [ ]   Blood in stool     GENITOURINARY:  [ ]   Burning with urination, [ ]   Blood in urine  PSYCHIATRIC:  [x]  history of major depression  INTEGUMENTARY:  [  ] rashes, [ ]  ulcers  CONSTITUTIONAL:  [ ]  fever, [ ]  chills  For VQI Use Only  PRE-ADM LIVING: Home  AMB STATUS: Ambulatory  CAD Sx: None  PRIOR CHF: None  STRESS TEST: [x]  No, [ ]  Normal, [ ]  + ischemia, [ ]  + MI, [ ]  Both  Physical Examination Filed Vitals:   12/05/12 1259  BP: 133/83  Pulse: 51  Resp: 14  Height: 5\' 9"  (1.753 m)  Weight: 160 lb (72.576 kg)  SpO2: 99%   Body mass index is 23.62 kg/(m^2).  General: A&O x 3, WDWN  Head: [place]/AT  Ear/Nose/Throat: Hearing grossly intact, nares w/o erythema or drainage, oropharynx w/o Erythema/Exudate  Eyes: PERRLA, EOMI  Neck: Supple, no nuchal rigidity, no palpable LAD  Pulmonary: Sym exp, good air movt, CTAB, no rales, rhonchi, & wheezing  Cardiac: RRR, Nl S1, S2, no Murmurs, rubs or gallops  Vascular: Vessel Right Left  Radial Palpable Palpable  Brachial  Palpable Palpable  Carotid Palpable, without bruit Palpable, without bruit  Aorta Not palpable N/A  Femoral Not Palpable Weakly Palpable  Popliteal Not palpable Not palpable  PT Not Palpable Not Palpable  DP Not Palpable Not Palpable   Gastrointestinal: soft, NTND, -G/R, - HSM, - masses, - CVAT B  Musculoskeletal: M/S 5/5 throughout , Extremities without ischemic changes , no ulcers or gangrene  Neurologic: CN 2-12 intact , Pain and light touch intact in extremities , Motor exam as listed above  Psychiatric: Judgment intact, Mood & affect appropriatefor pt's clinical situation  Dermatologic: See M/S exam for extremity exam, no rashes otherwise noted  Lymph : No Cervical, Axillary, or Inguinal lymphadenopathy   Non-Invasive Vascular Imaging  Outside ABI (Date: 10/27/12)  R: 0.65  L: 0.79  BLE arterial duplex (12/05/2012)  Mild diffuse disease bilaterals with patent arteries  Monophasic inflow c/w iliac disease  Medical Decision Making  Tommy Malone is a 47 y.o. 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  The duplex of R leg is concerning already for near occlusion on that side.    The L subclavian steal also points to likely atherosclerotic involvement of multiple vascular beds.  Given the severity of the sx and findings, I would proceed with Aortogram, bilateral leg runoff, and possible intervention,  scheduled for 20 NOV 14.  I discussed with the patient the natural history of intermittent claudication: 75% of patients have stable or improved symptoms in a year an only 2% require amputation. Eventually 20% may require intervention in a year.  I discussed in depth with the patient the nature of atherosclerosis, and emphasized the importance of maximal medical management including strict control of blood pressure, blood glucose, and lipid levels, antiplatelet agent, obtaining regular exercise, and cessation of smoking.    The patient is aware that without maximal medical management the underlying atherosclerotic disease process will progress, limiting the benefit of any interventions.  I discussed in depth with the patient a walking plan and how to execute such. The patient is currently not on a statin.  I suspect his lipid panel to be consistent with hyperlipidemia.  I will start the patient on Lipitor after his procedure. The patient is currently not on an anti-platelet.  The patient will be started on Plavix 75 mg PO daily after his procedure.  Thank you for allowing [place] to participate in this patient's care.  Leonides Sake, MD [facility] Office: [phone] Pager: [phone]  12/05/2012, 1:22 PM

## 2012-12-18 NOTE — Op Note (Signed)
OPERATIVE NOTE   PROCEDURE: 1.  Left common femoral artery cannulation under ultrasound guidance 2.  Placement of catheter in aorta 3.  Aortogram 4.  Bilateral runoff  PRE-OPERATIVE DIAGNOSIS: bilateral short distance claudication, buttock claudication  POST-OPERATIVE DIAGNOSIS: same as above   SURGEON: Leonides Sake, MD  ANESTHESIA: conscious sedation  ESTIMATED BLOOD LOSS: 50 cc  CONTRAST: 115 cc  FINDING(S):  Aorta: Patent proximally, distal aorta partially occluded  Superior mesenteric artery: Patent Celiac artery: Patent   Right Left  RA Patent Patent  CIA Occluded Severe long tapered stenosis, collateral vessel fill faster than CIA  EIA Proximally occluded, distally patent, reconstitutes via lumbar filled collaterals Proximal severe stenosis with distal segment patent, reconstitutes via lumbar filled collaterals  IIA Occluded Occluded  CFA Patent Patent  SFA Patent Patent  PFA Patent Patent  Pop Patent Patent  Trif Patent Patent  AT Patent Patent  Pero Patent Patent  PT Patent Patent   SPECIMEN(S):  none  INDICATIONS:   Tommy Malone is a 47 y.o. male who presents with short distance claudication and bilateral buttock claudication.  The patient presents for: aortogram, bilateral leg runoff, and possible itnervention.  I discussed with the patient the nature of angiographic procedures, especially the limited patencies of any endovascular intervention.  The patient is aware of that the risks of an angiographic procedure include but are not limited to: bleeding, infection, access site complications, renal failure, embolization, rupture of vessel, dissection, possible need for emergent surgical intervention, possible need for surgical procedures to treat the patient's pathology, and stroke and death.  The patient is aware of the risks and agrees to proceed.  DESCRIPTION: After full informed consent was obtained from the patient, the patient was brought back to the  angiography suite.  The patient was placed supine upon the angiography table and connected to monitoring equipment.  The patient was then given conscious sedation, the amounts of which are documented in the patient's chart.  The patient was prepped and drape in the standard fashion for an angiographic procedure.  At this point, attention was turned to the left groin.  Under ultrasound guidance, the left common femoral artery was cannulated with a micropuncture needle.  The microwire was advanced into the iliac arterial system.  The needle was exchanged for a microsheath, which was loaded into the common femoral artery over the wire.  The microwire was exchanged for a [facility] wire which was advanced into the aorta.  The microsheath was then exchanged for a 5-Fr sheath which was loaded into the common femoral artery. The Omniflush catheter was then loaded over the wire up to the level of L1.  The catheter was connected to the power injector circuit.  After de-airring and de-clotting the circuit, a power injector aortogram was completed.  The findings are listed above.  The appeared the distal lumbar arteries were the primary collateral pathway for filling this patient's legs, so I pulled the catheter down to just proximal to this set of arteries.  An automated bilateral leg runoff was completed.  The findings are listed above.  I replaced the wire into the catheter, straightening out the crook in the catheter.  Both were removed from the sheath together.  The sheath was aspirated.  No clots were present and the sheath was reloaded with heparinized saline.    COMPLICATIONS: none  CONDITION: stable  Leonides Sake, MD [facility] [place] Office: [phone] Pager: [phone]  12/18/2012, 10:17 AM

## 2012-12-18 NOTE — Interval H&P Note (Signed)
[facility] [place]  History and Physical Update  The patient was interviewed and re-examined.  The patient's previous History and Physical has been reviewed and is unchanged.  There is no change in the plan of care: Aortogram, bilateral leg runoff, and possible intervention.  Leonides Sake, MD [facility] [place] Office: [phone] Pager: [phone]  12/18/2012, 7:19 AM

## 2012-12-18 NOTE — Progress Notes (Signed)
Discharge instruction given per MD order.  Pt to car via wheelchair. 

## 2012-12-19 ENCOUNTER — Telehealth: Payer: Self-pay | Admitting: Vascular Surgery

## 2012-12-19 NOTE — Telephone Encounter (Addendum)
Message copied by Fredrich Birks on Fri Dec 19, 2012  9:56 AM ------      Message from: Melene Plan      Created: Thu Dec 18, 2012  2:29 PM                   ----- Message -----         From: Fransisco Hertz, MD         Sent: 12/18/2012  10:49 AM           To: Reuel Derby, Melene Plan, RN            MACKAY HANAUER      [id number]      [id number]            Procedure:       1.  Left common femoral artery cannulation under ultrasound guidance      2.  Placement of catheter in aorta      3.  Aortogram      4.  Bilateral runoff            Follow-up: after cardiology work-up completed            Orders(s) for follow-up:       1.  [facility] Card[facility] preop evaluation for aortobifemoral bypass       ------  12/19/12: left detailed message with cardiology appt and f/u chen appt on pts home phone, dpm

## 2012-12-23 ENCOUNTER — Ambulatory Visit: Payer: PRIVATE HEALTH INSURANCE | Admitting: Cardiology

## 2013-01-01 ENCOUNTER — Encounter: Payer: Self-pay | Admitting: Vascular Surgery

## 2013-01-02 ENCOUNTER — Other Ambulatory Visit: Payer: Self-pay | Admitting: *Deleted

## 2013-01-02 ENCOUNTER — Encounter: Payer: Self-pay | Admitting: Vascular Surgery

## 2013-01-02 ENCOUNTER — Ambulatory Visit (INDEPENDENT_AMBULATORY_CARE_PROVIDER_SITE_OTHER): Payer: PRIVATE HEALTH INSURANCE | Admitting: Internal Medicine

## 2013-01-02 ENCOUNTER — Encounter: Payer: Self-pay | Admitting: Internal Medicine

## 2013-01-02 ENCOUNTER — Encounter: Payer: Self-pay | Admitting: *Deleted

## 2013-01-02 ENCOUNTER — Ambulatory Visit (INDEPENDENT_AMBULATORY_CARE_PROVIDER_SITE_OTHER): Payer: PRIVATE HEALTH INSURANCE | Admitting: Vascular Surgery

## 2013-01-02 VITALS — BP 118/82 | HR 82 | Ht 69.0 in | Wt 165.0 lb

## 2013-01-02 VITALS — BP 107/68 | HR 77 | Ht 69.0 in | Wt 164.6 lb

## 2013-01-02 DIAGNOSIS — I2510 Atherosclerotic heart disease of native coronary artery without angina pectoris: Secondary | ICD-10-CM

## 2013-01-02 DIAGNOSIS — Z01818 Encounter for other preprocedural examination: Secondary | ICD-10-CM

## 2013-01-02 DIAGNOSIS — I70219 Atherosclerosis of native arteries of extremities with intermittent claudication, unspecified extremity: Secondary | ICD-10-CM

## 2013-01-02 MED ORDER — ATORVASTATIN CALCIUM 10 MG PO TABS: 10.0000 mg | ORAL_TABLET | Freq: Every day | ORAL | Status: DC

## 2013-01-02 NOTE — Patient Instructions (Signed)
Your physician wants you to follow-up in: 9 months with Dr Tenny Craw. You will receive a reminder letter in the mail two months in advance. If you don't receive a letter, please call our office to schedule the follow-up appointment.  Your physician has requested that you have a carotid duplex. This test is an ultrasound of the carotid arteries in your neck. It looks at blood flow through these arteries that supply the brain with blood. Allow one hour for this exam. There are no restrictions or special instructions.  Your physician has requested that you have a lexiscan myoview. For further information please visit [email or web address] Please follow instruction sheet, as given.  START LIPITOR 10 MG DAILY

## 2013-01-02 NOTE — Progress Notes (Signed)
HPI Patient is a 47 yo who is referred for preop risk evaluation The patient has no known CAD  He does have a history of CV disease (USN 2010)  He also has severe LE vasc disease  To undergo revascularization He deneis CP  Breathing does get a little short  Activity is limited by claudication.   Allergies  Allergen Reactions  . Atorvastatin     Stomach aches    Current Outpatient Prescriptions  Medication Sig Dispense Refill  . CIALIS 20 MG tablet Take 20 mg by mouth daily as needed.       . clonazePAM (KLONOPIN) 0.5 MG tablet Take 0.5 mg by mouth 2 (two) times daily as needed for anxiety.      [name] ibuprofen (ADVIL,MOTRIN) 200 MG tablet Take 600 mg by mouth every 6 (six) hours as needed for headache or moderate pain.      [name] omeprazole (PRILOSEC) 20 MG capsule Take 20 mg by mouth daily.      . sertraline (ZOLOFT) 100 MG tablet Take 100 mg by mouth daily.       No current facility-administered medications for this visit.    Past Medical History  Diagnosis Date  . Acid reflux   . Kidney stones   . Depression   . Anxiety     Past Surgical History  Procedure Laterality Date  . Elbow surgery    . Kidney stone surgery      Family History  Problem Relation Age of Onset  . Heart disease Mother   . Heart attack Mother   . Hyperlipidemia Mother   . Heart disease Maternal Grandmother   . Cancer Paternal Grandfather   . Heart disease Father   . Heart attack Father   . Hyperlipidemia Father     History   Social History  . Marital Status: Legally Separated    Spouse Name: N/A    Number of Children: N/A  . Years of Education: N/A   Occupational History  . Not on file.   Social History Main Topics  . Smoking status: Current Every Day Smoker -- 1.00 packs/day for 30 years    Types: Cigarettes  . Smokeless tobacco: Never Used  . Alcohol Use: No     Comment: occasional  . Drug Use: No  . Sexual Activity: Not on file   Other Topics Concern  . Not on file   Social  History Narrative  . No narrative on file    Review of Systems:  All systems reviewed.  They are negative to the above problem except as previously stated.  Vital Signs: BP 118/82  Pulse 82  Ht 5\' 9"  (1.753 m)  Wt 165 lb (74.844 kg)  BMI 24.36 kg/m2  Physical Exam Patient is in NAD HEENT:  Normocephalic, atraumatic. EOMI, PERRLA.  Neck: JVP is normal.  No bruits.  Lungs: clear to auscultation. No rales no wheezes.  Heart: Regular rate and rhythm. Normal S1, S2. No S3.   No significant murmurs. PMI not displaced.  Abdomen:  Supple, nontender. Normal bowel sounds. No masses. No hepatomegaly.  Extremities:    Tr PT. No lower extremity edema.  Musculoskeletal :moving all extremities.  Neuro:   alert and oriented x3.  CN II-XII grossly intact.  EKG  SB 49 bpm  (12/18/12)  Assessment and Plan:  1.  PReop evaluation:  Patient's activity is limited  I would sched for a lexiscan myoview  2.  CVdisease  Repeat USN of carotids  3.  HL  Patient needs to be on a statin  Did not tolerate simvistatin  Will put on lipitor 10 mg  WIll need f/u lipid and AST in 8 wks  Patient followed by Dr Kemper Durie  4.  PVOD  Plan for revasc

## 2013-01-02 NOTE — Progress Notes (Signed)
[facility] [place]  Established Intermittent Claudication  History of Present Illness  Tommy Malone is a 47 y.o. (05/10/1965) male who presents with chief complaint: short distance claudication.  The patient's symptoms have progressed.  The patient's symptoms are: very short distance claudication.  His recently diagnostic aortogram with bilateral runoff demonstrated essentially distal aortic occlusion with collateral pelvic filling via distal lumbars.  The patient's treatment regimen currently included: maximal medical management.  The patient is scheduled to see a Cardiologist today for preoperative risk stratification and optimization for a possible aortobifemoral graft.  Past Medical History   Diagnosis  Date   .  Acid reflux    .  Kidney stones    .  Depression    .  Anxiety    L subclavian steal  Erectile dysfunction. Past Surgical History   Procedure  Laterality  Date   .  Elbow surgery     .  Kidney stone surgery      History    Social History   .  Marital Status:  Legally Separated     Spouse Name:  N/A     Number of Children:  N/A   .  Years of Education:  N/A    Occupational History   .  Not on file.    Social History Main Topics   .  Smoking status:  Current Every Day Smoker -- 1.00 packs/day for 30 years     Types:  Cigarettes   .  Smokeless tobacco:  Never Used   .  Alcohol Use:  No      Comment: occasional   .  Drug Use:  No   .  Sexual Activity:  Not on file    Other Topics  Concern   .  Not on file    Social History Narrative   .  No narrative on file    Family History   Problem  Relation  Age of Onset   .  Heart disease  Mother    .  Heart attack  Mother    .  Hyperlipidemia  Mother    .  Heart disease  Maternal Grandmother    .  Cancer  Paternal Grandfather    .  Heart disease  Father    .  Heart attack  Father    .  Hyperlipidemia  Father     Current Outpatient Prescriptions on File Prior to Visit   Medication  Sig   Dispense  Refill   .  clonazePAM (KLONOPIN) 0.5 MG tablet  Take 0.5 mg by mouth 2 (two) times daily as needed for anxiety.     .  omeprazole (PRILOSEC) 20 MG capsule  Take 20 mg by mouth daily.     .  PRESCRIPTION MEDICATION  Ambilify 2 mg 1 tablet every morning     .  sertraline (ZOLOFT) 100 MG tablet  Take 100 mg by mouth daily.     .  amoxicillin-clavulanate (AUGMENTIN) 875-125 MG per tablet  Take 1 tablet by mouth 2 (two) times daily.  20 tablet  0   .  baclofen (LIORESAL) 10 MG tablet  Take 10 mg by mouth 3 (three) times daily.     .  DULoxetine (CYMBALTA) 60 MG capsule  Take 60 mg by mouth daily.     .  fluticasone (FLONASE) 50 MCG/ACT nasal spray  Place 2 sprays into the nose daily.  16 g  3   .    ipratropium (ATROVENT) 0.03 % nasal spray  Place 2 sprays into the nose 2 (two) times daily.  30 mL  1   .  pregabalin (LYRICA) 50 MG capsule  Take 50 mg by mouth 3 (three) times daily.      No current facility-administered medications on file prior to visit.    No Known Allergies   REVIEW OF SYSTEMS: (Positives checked otherwise negative)  CARDIOVASCULAR: [ ] chest pain, [ ] chest pressure, [ ] palpitations, [ ] shortness of breath when laying flat, [ ] shortness of breath with exertion, [x] pain in feet when walking, [ ] pain in feet when laying flat, [ ] history of blood clot in veins (DVT), [ ] history of phlebitis, [ ] swelling in legs, [ ] varicose veins  PULMONARY: [ ] productive cough, [ ] asthma, [ ] wheezing  NEUROLOGIC: [ ] weakness in arms or legs, [ ] numbness in arms or legs, [ ] difficulty speaking or slurred speech, [ ] temporary loss of vision in one eye, [ ] dizziness  HEMATOLOGIC: [ ] bleeding problems, [ ] problems with blood clotting too easily  MUSCULOSKEL: [ ] joint pain, [ ] joint swelling  GASTROINTEST: [ ] Vomiting blood, [ ] Blood in stool  GENITOURINARY: [ ] Burning with urination, [ ] Blood in urine  PSYCHIATRIC: [x] history of major depression  INTEGUMENTARY: [ ]  rashes, [ ] ulcers  CONSTITUTIONAL: [ ] fever, [ ] chills   For VQI Use Only  PRE-ADM LIVING: Home  AMB STATUS: Ambulatory  CAD Sx: None  PRIOR CHF: None  STRESS TEST: [x] No, [ ] Normal, [ ] + ischemia, [ ] + MI, [ ] Both   Physical Examination  Filed Vitals:   01/02/13 0834  BP: 107/68  Pulse: 77  Height: 5' 9" (1.753 m)  Weight: 164 lb 9.6 oz (74.662 kg)  SpO2: 100%   Body mass index is 24.3 kg/(m^2).  General: A&O x 3, WDWN   Eyes: PERRLA, EOMI   Neck: Supple, no nuchal rigidity, no palpable LAD   Pulmonary: Sym exp, good air movt, CTAB, no rales, rhonchi, & wheezing   Cardiac: RRR, Nl S1, S2, no Murmurs, rubs or gallops   Vascular:  Vessel  Right  Left   Radial  Palpable  Palpable   Brachial  Palpable  Palpable   Carotid  Palpable, without bruit  Palpable, without bruit   Aorta  Not palpable  N/A   Femoral  Not Palpable  Weakly Palpable   Popliteal  Not palpable  Not palpable   PT  Not Palpable  Not Palpable   DP  Not Palpable  Not Palpable    Gastrointestinal: soft, NTND, -G/R, - HSM, - masses, - CVAT B   Musculoskeletal: M/S 5/5 throughout , Extremities without ischemic changes , no ulcers or gangrene   Neurologic: CN 2-12 intact , Pain and light touch intact in extremities , Motor exam as listed above   Medical Decision Making  Tommy Malone is a 47 y.o. male who presents with:  bilateral leg lifestyle limiting intermittent claudication without evidence of critical limb ischemia currently   The patient is likely to present in the future with acute leg ischemia given the tenuous blood supply to his legs at this point.    Based on the patient's vascular studies and examination, I have offered the patient: aortobifemoral bypass grafting.  Without any chance of retrograde blood flow into the iliac arteries, I   am planning an end to side proximal anastomosis to avoid terminating the pelvic blood flow.  I discussed in depth with the patient the nature of  atherosclerosis, and emphasized the importance of maximal medical management including strict control of blood pressure, blood glucose, and lipid levels, antiplatelet agents, obtaining regular exercise, and cessation of smoking.    The patient is aware that without maximal medical management the underlying atherosclerotic disease process will progress, limiting the benefit of any interventions. The patient is currently on a statin: Lipitor.   The patient is currently not on an anti-platelet as it will potentiate bleeding for an operation with significant expected blood loss. Tenatively, the patient is scheduled for the 6th of January 2015, pending any additional delay due to his cardiology work-up.  Thank you for allowing us to participate in this patient's care.  Brian Chen, MD [facility] [place] Office: [phone] Pager: [phone]  01/02/2013, 4:34 PM   

## 2013-01-05 ENCOUNTER — Encounter (HOSPITAL_COMMUNITY): Payer: Self-pay | Admitting: Respiratory Therapy

## 2013-01-05 ENCOUNTER — Other Ambulatory Visit: Payer: Self-pay

## 2013-01-05 ENCOUNTER — Telehealth: Payer: Self-pay | Admitting: Vascular Surgery

## 2013-01-05 NOTE — Telephone Encounter (Signed)
notified patient of appointment at [facility], [address] [facility] on 01-06-13 at 1:45, needs to arrive at 1:20, NPO 2 hrs prior, no caffiene after midnight the night before, wear comfy clothes, and aftershave or cologne.

## 2013-01-06 ENCOUNTER — Ambulatory Visit (HOSPITAL_COMMUNITY)
Admission: RE | Admit: 2013-01-06 | Discharge: 2013-01-06 | Disposition: A | Discharge status: Home / Self Care | Payer: PRIVATE HEALTH INSURANCE | Source: Ambulatory Visit | Attending: Internal Medicine | Admitting: Internal Medicine

## 2013-01-06 ENCOUNTER — Encounter (HOSPITAL_COMMUNITY)
Admission: RE | Admit: 2013-01-06 | Discharge: 2013-01-06 | Disposition: A | Discharge status: Home / Self Care | Payer: PRIVATE HEALTH INSURANCE | Source: Ambulatory Visit | Attending: Vascular Surgery | Admitting: Vascular Surgery

## 2013-01-06 ENCOUNTER — Ambulatory Visit (HOSPITAL_COMMUNITY)
Admission: RE | Admit: 2013-01-06 | Discharge: 2013-01-06 | Disposition: A | Discharge status: Home / Self Care | Payer: PRIVATE HEALTH INSURANCE | Source: Ambulatory Visit | Attending: Anesthesiology | Admitting: Anesthesiology

## 2013-01-06 ENCOUNTER — Encounter (HOSPITAL_COMMUNITY): Payer: Self-pay | Admitting: Certified Registered Nurse Anesthetist

## 2013-01-06 ENCOUNTER — Encounter (HOSPITAL_COMMUNITY): Payer: Self-pay

## 2013-01-06 DIAGNOSIS — F17200 Nicotine dependence, unspecified, uncomplicated: Secondary | ICD-10-CM | POA: Insufficient documentation

## 2013-01-06 DIAGNOSIS — Z01810 Encounter for preprocedural cardiovascular examination: Secondary | ICD-10-CM

## 2013-01-06 DIAGNOSIS — Z8249 Family history of ischemic heart disease and other diseases of the circulatory system: Secondary | ICD-10-CM | POA: Insufficient documentation

## 2013-01-06 DIAGNOSIS — I779 Disorder of arteries and arterioles, unspecified: Secondary | ICD-10-CM | POA: Insufficient documentation

## 2013-01-06 DIAGNOSIS — Z01812 Encounter for preprocedural laboratory examination: Secondary | ICD-10-CM | POA: Insufficient documentation

## 2013-01-06 DIAGNOSIS — Z01818 Encounter for other preprocedural examination: Secondary | ICD-10-CM | POA: Insufficient documentation

## 2013-01-06 DIAGNOSIS — R0602 Shortness of breath: Secondary | ICD-10-CM | POA: Insufficient documentation

## 2013-01-06 DIAGNOSIS — I739 Peripheral vascular disease, unspecified: Secondary | ICD-10-CM | POA: Insufficient documentation

## 2013-01-06 HISTORY — DX: Peripheral vascular disease, unspecified: I73.9

## 2013-01-06 HISTORY — DX: Unspecified hemorrhoids: K64.9

## 2013-01-06 LAB — TYPE AND SCREEN
ABO/RH(D): A POS
Antibody Screen: NEGATIVE

## 2013-01-06 LAB — BLOOD GAS, ARTERIAL
Acid-base deficit: 0.7 mmol/L (ref 0.0–2.0)
Bicarbonate: 23.1 mEq/L (ref 20.0–24.0)
FIO2: 0.21 %
O2 Saturation: 98.8 %
Patient temperature: 98.6
pO2, Arterial: 109 mmHg — ABNORMAL HIGH (ref 80.0–100.0)

## 2013-01-06 LAB — URINALYSIS, ROUTINE W REFLEX MICROSCOPIC
Glucose, UA: NEGATIVE mg/dL
Hgb urine dipstick: NEGATIVE
Ketones, ur: 15 mg/dL — AB
Nitrite: NEGATIVE
Protein, ur: NEGATIVE mg/dL
Specific Gravity, Urine: 1.034 — ABNORMAL HIGH (ref 1.005–1.030)
Urobilinogen, UA: 1 mg/dL (ref 0.0–1.0)

## 2013-01-06 LAB — CBC
Hemoglobin: 17.4 g/dL — ABNORMAL HIGH (ref 13.0–17.0)
MCH: 32 pg (ref 26.0–34.0)
MCHC: 35.5 g/dL (ref 30.0–36.0)
MCV: 90.2 fL (ref 78.0–100.0)
RBC: 5.43 MIL/uL (ref 4.22–5.81)

## 2013-01-06 LAB — ABO/RH: ABO/RH(D): A POS

## 2013-01-06 LAB — COMPREHENSIVE METABOLIC PANEL
Albumin: 3.7 g/dL (ref 3.5–5.2)
Alkaline Phosphatase: 98 U/L (ref 39–117)
BUN: 13 mg/dL (ref 6–23)
CO2: 21 mEq/L (ref 19–32)
Calcium: 9.4 mg/dL (ref 8.4–10.5)
GFR calc non Af Amer: >90 mL/min (ref >90)
Glucose, Bld: 87 mg/dL (ref 70–99)
Potassium: 4.4 mEq/L (ref 3.5–5.1)
Total Protein: 6.9 g/dL (ref 6.0–8.3)

## 2013-01-06 LAB — APTT: aPTT: 27 seconds (ref 24–37)

## 2013-01-06 LAB — SURGICAL PCR SCREEN: MRSA, PCR: NEGATIVE

## 2013-01-06 LAB — PROTIME-INR
INR: 0.86 (ref 0.00–1.49)
Prothrombin Time: 11.6 seconds (ref 11.6–15.2)

## 2013-01-06 MED ORDER — TECHNETIUM TC 99M SESTAMIBI GENERIC - CARDIOLITE
30.8000 | Freq: Once | INTRAVENOUS | Status: AC | PRN
  Administered 2013-01-06: 30.8 via INTRAVENOUS

## 2013-01-06 MED ORDER — DEXTROSE 5 % IV SOLN
1.5000 g | INTRAVENOUS | Status: DC
  Filled 2013-01-06: qty 1.5

## 2013-01-06 MED ORDER — REGADENOSON 0.4 MG/5ML IV SOLN
0.4000 mg | Freq: Once | INTRAVENOUS | Status: AC
  Administered 2013-01-06: 0.4 mg via INTRAVENOUS

## 2013-01-06 MED ORDER — TECHNETIUM TC 99M SESTAMIBI GENERIC - CARDIOLITE
9.9000 | Freq: Once | INTRAVENOUS | Status: AC | PRN
  Administered 2013-01-06: 10 via INTRAVENOUS

## 2013-01-06 NOTE — Procedures (Addendum)
[facility] [address] [place] [address] [phone]  Cardiology Nuclear Med Study  Tommy Malone is Malone 47 y.o. male     MRN : [id number]     DOB: November 11, 1965  Procedure Date: 01/06/2013  Nuclear Med Background Indication for Stress Test:  Surgical Clearance and Abnormal EKG History:  No prior history reported by patient Cardiac Risk Factors: Carotid Disease, Family History - CAD, Lipids, PVD and Smoker  Symptoms:  SOB   Nuclear Pre-Procedure Caffeine/Decaff Intake:  1:00am NPO After: 11am   IV Site: R Forearm  IV 0.9% NS with Angio Cath:  22g  Chest Size (in):  42"  IV Started by: Tommy Pomfret, RN  Height: 5\' 9"  (1.753 m)  Cup Size: n/Malone  BMI:  Body mass index is 24.36 kg/(m^2). Weight:  165 lb (74.844 kg)   Tech Comments:  n/Malone    Nuclear Med Study 1 or 2 day study: 1 day  Stress Test Type:  Lexiscan  Order Authorizing Provider:  Dietrich Pates, MD   Resting Radionuclide: Technetium 92m Sestamibi  Resting Radionuclide Dose: 9.9 mCi   Stress Radionuclide:  Technetium 36m Sestamibi  Stress Radionuclide Dose: 30.8 mCi           Stress Protocol Rest HR: 75 Stress HR: 111  Rest BP: 108/82 Stress BP: 127/79  Exercise Time (min): n/Malone METS: n/Malone   Predicted Max HR: 174 bpm % Max HR: 67.82 bpm Rate Pressure Product: 25366  Dose of Adenosine (mg):  n/Malone Dose of Lexiscan: 0.4 mg  Dose of Atropine (mg): n/Malone Dose of Dobutamine: n/Malone mcg/kg/min (at max HR)  Stress Test Technologist: Tommy Malone, CCT Nuclear Technologist: Tommy Malone, CNMT   Rest Procedure:  Myocardial perfusion imaging was performed at rest 45 minutes following the intravenous administration of Technetium 75m Sestamibi. Stress Procedure:  The patient received IV Lexiscan 0.4 mg over 15-seconds.  Technetium 33m Sestamibi injected at 30-seconds.  There were no significant changes with Lexiscan.  Quantitative spect images were obtained after Malone 45 minute  delay.  Transient Ischemic Dilatation (Normal <1.22):  1.03 Lung/Heart Ratio (Normal <0.45):  0.34 QGS EDV:  74 ml QGS ESV:  31 ml LV Ejection Fraction: 57%  Signed by      Rest ECG: NSR - Normal EKG  Stress ECG: No significant change from baseline ECG  QPS Raw Data Images:  Normal; no motion artifact; normal heart/lung ratio. Stress Images:  Normal homogeneous uptake in all areas of the myocardium. Rest Images:  Normal homogeneous uptake in all areas of the myocardium. Subtraction (SDS):  Normal  Impression Exercise Capacity:  Lexiscan with no exercise. BP Response:  Normal blood pressure response. Clinical Symptoms:  Mild shortness of breath ECG Impression:  No significant ST segment change suggestive of ischemia. Comparison with Prior Nuclear Study: No images to compare  Overall Impression:  Normal stress nuclear study.  LV Wall Motion:  NL LV Function, EF 57%; NL Wall Motion   Tommy Kram A, MD  01/06/2013 6:07 PM

## 2013-01-06 NOTE — Progress Notes (Signed)
01/06/13 1158  OBSTRUCTIVE SLEEP APNEA  Have you ever been diagnosed with sleep apnea through a sleep study? No  Do you snore loudly (loud enough to be heard through closed doors)?  1  Do you often feel tired, fatigued, or sleepy during the daytime? 1  Has anyone observed you stop breathing during your sleep? 1  Do you have, or are you being treated for high blood pressure? 0  BMI more than 35 kg/m2? 0  Age over 46 years old? 0  Neck circumference greater than 40 cm/18 inches? (15)  Gender: 1  Obstructive Sleep Apnea Score 4  Score 4 or greater  Results sent to PCP

## 2013-01-06 NOTE — Pre-Procedure Instructions (Signed)
Tommy Malone  01/06/2013   Your procedure is scheduled on:  Wednesday, December 10th  Report to  at 6:30 AM [facility], Main Entrance / Entrance "A"  Call this number if you have problems the morning of surgery: [phone]   Remember:   Do not eat food or drink liquids after midnight.   Take these medicines the morning of surgery with A SIP OF WATER: Omeprazole, Zoloft. May take Klonopin if needed.   Do not wear jewelry, make-up or nail polish.  Do not wear lotions, powders, or perfumes. You may wear deodorant.  Do not shave 48 hours prior to surgery. Men may shave face and neck.  Do not bring valuables to the hospital.  [facility] is not responsible for any belongings or valuables.               Contacts, dentures or bridgework may not be worn into surgery.  Leave suitcase in the car. After surgery it may be brought to your room.  For patients admitted to the hospital, discharge time is determined by your treatment team.                 Special Instructions: Shower with CHG wash (Bactoshield) tonight and again in the am prior to arriving to hospital.   Please read over the following fact sheets that you were given: Pain Booklet, Coughing and Deep Breathing, Blood Transfusion Information and Surgical Site Infection Prevention

## 2013-01-07 ENCOUNTER — Encounter (HOSPITAL_COMMUNITY)
Admission: RE | Disposition: A | Discharge status: Home / Self Care | Payer: PRIVATE HEALTH INSURANCE | Source: Ambulatory Visit | Attending: Vascular Surgery

## 2013-01-07 ENCOUNTER — Inpatient Hospital Stay (HOSPITAL_COMMUNITY): Payer: PRIVATE HEALTH INSURANCE

## 2013-01-07 ENCOUNTER — Encounter (HOSPITAL_COMMUNITY): Payer: Self-pay | Admitting: *Deleted

## 2013-01-07 ENCOUNTER — Encounter (HOSPITAL_COMMUNITY): Payer: PRIVATE HEALTH INSURANCE | Admitting: Certified Registered Nurse Anesthetist

## 2013-01-07 ENCOUNTER — Inpatient Hospital Stay (HOSPITAL_COMMUNITY): Payer: PRIVATE HEALTH INSURANCE | Admitting: Certified Registered Nurse Anesthetist

## 2013-01-07 ENCOUNTER — Inpatient Hospital Stay (HOSPITAL_COMMUNITY)
Admission: RE | Admit: 2013-01-07 | Discharge: 2013-01-14 | DRG: 238 | Disposition: A | Discharge status: Home / Self Care | Payer: PRIVATE HEALTH INSURANCE | Source: Ambulatory Visit | Attending: Vascular Surgery | Admitting: Vascular Surgery

## 2013-01-07 ENCOUNTER — Encounter (HOSPITAL_COMMUNITY): Payer: PRIVATE HEALTH INSURANCE

## 2013-01-07 DIAGNOSIS — Z79899 Other long term (current) drug therapy: Secondary | ICD-10-CM

## 2013-01-07 DIAGNOSIS — M79609 Pain in unspecified limb: Secondary | ICD-10-CM

## 2013-01-07 DIAGNOSIS — I70229 Atherosclerosis of native arteries of extremities with rest pain, unspecified extremity: Secondary | ICD-10-CM

## 2013-01-07 DIAGNOSIS — I708 Atherosclerosis of other arteries: Principal | ICD-10-CM | POA: Diagnosis present

## 2013-01-07 DIAGNOSIS — F411 Generalized anxiety disorder: Secondary | ICD-10-CM | POA: Diagnosis present

## 2013-01-07 DIAGNOSIS — F17200 Nicotine dependence, unspecified, uncomplicated: Secondary | ICD-10-CM | POA: Diagnosis present

## 2013-01-07 DIAGNOSIS — F3289 Other specified depressive episodes: Secondary | ICD-10-CM | POA: Diagnosis present

## 2013-01-07 DIAGNOSIS — Z8249 Family history of ischemic heart disease and other diseases of the circulatory system: Secondary | ICD-10-CM

## 2013-01-07 DIAGNOSIS — F329 Major depressive disorder, single episode, unspecified: Secondary | ICD-10-CM | POA: Diagnosis present

## 2013-01-07 DIAGNOSIS — I7409 Other arterial embolism and thrombosis of abdominal aorta: Secondary | ICD-10-CM | POA: Diagnosis present

## 2013-01-07 DIAGNOSIS — I70219 Atherosclerosis of native arteries of extremities with intermittent claudication, unspecified extremity: Secondary | ICD-10-CM

## 2013-01-07 DIAGNOSIS — R0989 Other specified symptoms and signs involving the circulatory and respiratory systems: Secondary | ICD-10-CM

## 2013-01-07 DIAGNOSIS — K219 Gastro-esophageal reflux disease without esophagitis: Secondary | ICD-10-CM | POA: Diagnosis present

## 2013-01-07 DIAGNOSIS — K5900 Constipation, unspecified: Secondary | ICD-10-CM | POA: Diagnosis not present

## 2013-01-07 HISTORY — PX: AORTA - BILATERAL FEMORAL ARTERY BYPASS GRAFT: SHX1175

## 2013-01-07 LAB — POCT I-STAT 3, ART BLOOD GAS (G3+)
Bicarbonate: 25.4 mEq/L — ABNORMAL HIGH (ref 20.0–24.0)
O2 Saturation: 99 %
Patient temperature: 37.1
pCO2 arterial: 54.1 mmHg — ABNORMAL HIGH (ref 35.0–45.0)
pH, Arterial: 7.281 — ABNORMAL LOW (ref 7.350–7.450)

## 2013-01-07 LAB — CBC
HCT: 44.1 % (ref 39.0–52.0)
MCV: 88.7 fL (ref 78.0–100.0)
Platelets: 131 10*3/uL — ABNORMAL LOW (ref 150–400)
RDW: 13.2 % (ref 11.5–15.5)
WBC: 13.3 10*3/uL — ABNORMAL HIGH (ref 4.0–10.5)

## 2013-01-07 LAB — PROTIME-INR
INR: 0.98 (ref 0.00–1.49)
Prothrombin Time: 12.8 seconds (ref 11.6–15.2)

## 2013-01-07 LAB — BASIC METABOLIC PANEL
BUN: 12 mg/dL (ref 6–23)
Chloride: 101 mEq/L (ref 96–112)
Creatinine, Ser: 0.86 mg/dL (ref 0.50–1.35)
GFR calc Af Amer: >90 mL/min (ref >90)
GFR calc non Af Amer: >90 mL/min (ref >90)
Potassium: 4.2 mEq/L (ref 3.5–5.1)
Sodium: 136 mEq/L (ref 135–145)

## 2013-01-07 LAB — MAGNESIUM: Magnesium: 1.3 mg/dL — ABNORMAL LOW (ref 1.5–2.5)

## 2013-01-07 LAB — APTT: aPTT: 28 seconds (ref 24–37)

## 2013-01-07 SURGERY — CREATION, BYPASS, ARTERIAL, AORTA TO FEMORAL, BILATERAL, USING GRAFT
Anesthesia: General

## 2013-01-07 MED ORDER — LORAZEPAM 2 MG/ML IJ SOLN
0.5000 mg | Freq: Four times a day (QID) | INTRAMUSCULAR | Status: DC | PRN
  Administered 2013-01-07 – 2013-01-08 (×3): 0.5 mg via INTRAVENOUS
  Filled 2013-01-07 (×3): qty 1

## 2013-01-07 MED ORDER — LACTATED RINGERS IV SOLN
INTRAVENOUS | Status: DC | PRN
  Administered 2013-01-07: 08:00:00 via INTRAVENOUS

## 2013-01-07 MED ORDER — HYDROMORPHONE HCL PF 1 MG/ML IJ SOLN
INTRAMUSCULAR | Status: AC
  Administered 2013-01-07: 0.5 mg via INTRAVENOUS
  Filled 2013-01-07: qty 1

## 2013-01-07 MED ORDER — MAGNESIUM SULFATE 40 MG/ML IJ SOLN: 2.0000 g | Freq: Once | INTRAMUSCULAR | Status: AC | PRN

## 2013-01-07 MED ORDER — DOCUSATE SODIUM 100 MG PO CAPS
100.0000 mg | ORAL_CAPSULE | Freq: Every day | ORAL | Status: DC
  Administered 2013-01-09 – 2013-01-14 (×6): 100 mg via ORAL
  Filled 2013-01-07 (×6): qty 1

## 2013-01-07 MED ORDER — ACETAMINOPHEN 325 MG PO TABS
325.0000 mg | ORAL_TABLET | ORAL | Status: DC | PRN
  Administered 2013-01-11 (×2): 650 mg via ORAL
  Filled 2013-01-07 (×2): qty 2

## 2013-01-07 MED ORDER — SENNOSIDES-DOCUSATE SODIUM 8.6-50 MG PO TABS
1.0000 | ORAL_TABLET | Freq: Every evening | ORAL | Status: DC | PRN
  Filled 2013-01-07: qty 1

## 2013-01-07 MED ORDER — IBUPROFEN 600 MG PO TABS
600.0000 mg | ORAL_TABLET | Freq: Four times a day (QID) | ORAL | Status: DC | PRN
  Filled 2013-01-07: qty 1

## 2013-01-07 MED ORDER — MANNITOL 25 % IV SOLN
INTRAVENOUS | Status: DC | PRN
  Administered 2013-01-07: 7 g via INTRAVENOUS
  Administered 2013-01-07: 8 g via INTRAVENOUS

## 2013-01-07 MED ORDER — HYDRALAZINE HCL 20 MG/ML IJ SOLN: 10.0000 mg | INTRAMUSCULAR | Status: DC | PRN

## 2013-01-07 MED ORDER — FLEET ENEMA 7-19 GM/118ML RE ENEM: 1.0000 | ENEMA | Freq: Once | RECTAL | Status: AC | PRN

## 2013-01-07 MED ORDER — ASPIRIN EC 81 MG PO TBEC
81.0000 mg | DELAYED_RELEASE_TABLET | Freq: Every day | ORAL | Status: DC
  Administered 2013-01-09 – 2013-01-14 (×6): 81 mg via ORAL
  Filled 2013-01-07 (×7): qty 1

## 2013-01-07 MED ORDER — MIDAZOLAM HCL 2 MG/2ML IJ SOLN: 1.0000 mg | INTRAMUSCULAR | Status: DC | PRN

## 2013-01-07 MED ORDER — CLONAZEPAM 0.5 MG PO TABS
0.5000 mg | ORAL_TABLET | Freq: Two times a day (BID) | ORAL | Status: DC | PRN
  Administered 2013-01-07 – 2013-01-14 (×8): 0.5 mg via ORAL
  Filled 2013-01-07 (×8): qty 1

## 2013-01-07 MED ORDER — SERTRALINE HCL 100 MG PO TABS
100.0000 mg | ORAL_TABLET | Freq: Every day | ORAL | Status: DC
  Administered 2013-01-09 – 2013-01-14 (×6): 100 mg via ORAL
  Filled 2013-01-07 (×7): qty 1

## 2013-01-07 MED ORDER — ALBUMIN HUMAN 5 % IV SOLN
INTRAVENOUS | Status: DC | PRN
  Administered 2013-01-07 (×2): via INTRAVENOUS

## 2013-01-07 MED ORDER — FENTANYL CITRATE 0.05 MG/ML IJ SOLN: 50.0000 ug | Freq: Once | INTRAMUSCULAR | Status: DC

## 2013-01-07 MED ORDER — 0.9 % SODIUM CHLORIDE (POUR BTL) OPTIME
TOPICAL | Status: DC | PRN
  Administered 2013-01-07: 2000 mL
  Administered 2013-01-07: 1000 mL

## 2013-01-07 MED ORDER — GLYCOPYRROLATE 0.2 MG/ML IJ SOLN
INTRAMUSCULAR | Status: DC | PRN
  Administered 2013-01-07: 0.2 mg via INTRAVENOUS
  Administered 2013-01-07: .8 mg via INTRAVENOUS

## 2013-01-07 MED ORDER — PROMETHAZINE HCL 25 MG/ML IJ SOLN: 6.2500 mg | INTRAMUSCULAR | Status: DC | PRN

## 2013-01-07 MED ORDER — ONDANSETRON HCL 4 MG/2ML IJ SOLN: 4.0000 mg | Freq: Four times a day (QID) | INTRAMUSCULAR | Status: DC | PRN

## 2013-01-07 MED ORDER — SODIUM CHLORIDE 0.9 % IV SOLN
INTRAVENOUS | Status: DC
  Administered 2013-01-07 (×2): via INTRAVENOUS

## 2013-01-07 MED ORDER — FENTANYL CITRATE 0.05 MG/ML IJ SOLN
INTRAMUSCULAR | Status: DC | PRN
  Administered 2013-01-07: 50 ug via INTRAVENOUS
  Administered 2013-01-07: 100 ug via INTRAVENOUS
  Administered 2013-01-07 (×7): 50 ug via INTRAVENOUS
  Administered 2013-01-07: 100 ug via INTRAVENOUS
  Administered 2013-01-07 (×2): 50 ug via INTRAVENOUS

## 2013-01-07 MED ORDER — HYDROMORPHONE HCL PF 1 MG/ML IJ SOLN
INTRAMUSCULAR | Status: AC
  Filled 2013-01-07: qty 1

## 2013-01-07 MED ORDER — ACETAMINOPHEN 650 MG RE SUPP: 325.0000 mg | RECTAL | Status: DC | PRN

## 2013-01-07 MED ORDER — DEXTROSE 5 % IV SOLN
1.5000 g | INTRAVENOUS | Status: DC | PRN
  Administered 2013-01-07: 1.5 g via INTRAVENOUS

## 2013-01-07 MED ORDER — HEPARIN SODIUM (PORCINE) 1000 UNIT/ML IJ SOLN
INTRAMUSCULAR | Status: DC | PRN
  Administered 2013-01-07: 7000 [IU] via INTRAVENOUS

## 2013-01-07 MED ORDER — ALUM & MAG HYDROXIDE-SIMETH 200-200-20 MG/5ML PO SUSP: 15.0000 mL | ORAL | Status: DC | PRN

## 2013-01-07 MED ORDER — OXYCODONE HCL 5 MG PO TABS: 5.0000 mg | ORAL_TABLET | Freq: Once | ORAL | Status: DC | PRN

## 2013-01-07 MED ORDER — MIDAZOLAM HCL 5 MG/5ML IJ SOLN
INTRAMUSCULAR | Status: DC | PRN
  Administered 2013-01-07 (×2): 1 mg via INTRAVENOUS

## 2013-01-07 MED ORDER — NEOSTIGMINE METHYLSULFATE 1 MG/ML IJ SOLN
INTRAMUSCULAR | Status: DC | PRN
  Administered 2013-01-07: 4 mg via INTRAVENOUS

## 2013-01-07 MED ORDER — THROMBIN 20000 UNITS EX SOLR
CUTANEOUS | Status: AC
  Filled 2013-01-07: qty 20000

## 2013-01-07 MED ORDER — ROCURONIUM BROMIDE 100 MG/10ML IV SOLN
INTRAVENOUS | Status: DC | PRN
  Administered 2013-01-07: 50 mg via INTRAVENOUS

## 2013-01-07 MED ORDER — LIDOCAINE HCL (CARDIAC) 20 MG/ML IV SOLN
INTRAVENOUS | Status: DC | PRN
  Administered 2013-01-07: 100 mg via INTRAVENOUS

## 2013-01-07 MED ORDER — DOPAMINE-DEXTROSE 3.2-5 MG/ML-% IV SOLN: 3.0000 ug/kg/min | INTRAVENOUS | Status: DC

## 2013-01-07 MED ORDER — OXYCODONE HCL 5 MG/5ML PO SOLN
5.0000 mg | Freq: Once | ORAL | Status: DC | PRN
Start: 2013-01-07 — End: 2013-01-07

## 2013-01-07 MED ORDER — SODIUM CHLORIDE 0.9 % IV SOLN: INTRAVENOUS | Status: DC

## 2013-01-07 MED ORDER — HYDROMORPHONE HCL PF 1 MG/ML IJ SOLN
0.2500 mg | INTRAMUSCULAR | Status: DC | PRN
  Administered 2013-01-07 (×4): 0.5 mg via INTRAVENOUS

## 2013-01-07 MED ORDER — EPHEDRINE SULFATE 50 MG/ML IJ SOLN
INTRAMUSCULAR | Status: DC | PRN
  Administered 2013-01-07: 5 mg via INTRAVENOUS

## 2013-01-07 MED ORDER — PHENYLEPHRINE HCL 10 MG/ML IJ SOLN
10.0000 mg | INTRAVENOUS | Status: DC | PRN
  Administered 2013-01-07: 25 ug/min via INTRAVENOUS

## 2013-01-07 MED ORDER — MORPHINE SULFATE 2 MG/ML IJ SOLN
2.0000 mg | INTRAMUSCULAR | Status: DC | PRN
  Administered 2013-01-07 – 2013-01-08 (×8): 4 mg via INTRAVENOUS
  Filled 2013-01-07 (×8): qty 2

## 2013-01-07 MED ORDER — SODIUM CHLORIDE 0.9 % IR SOLN
Status: DC | PRN
  Administered 2013-01-07: 09:00:00

## 2013-01-07 MED ORDER — DEXTROSE 5 % IV SOLN
1.5000 g | Freq: Two times a day (BID) | INTRAVENOUS | Status: AC
  Administered 2013-01-07 – 2013-01-08 (×2): 1.5 g via INTRAVENOUS
  Filled 2013-01-07 (×2): qty 1.5

## 2013-01-07 MED ORDER — METOPROLOL TARTRATE 1 MG/ML IV SOLN
2.0000 mg | INTRAVENOUS | Status: AC | PRN
  Administered 2013-01-07 – 2013-01-08 (×2): 5 mg via INTRAVENOUS
  Filled 2013-01-07 (×2): qty 5

## 2013-01-07 MED ORDER — HYDROMORPHONE HCL PF 1 MG/ML IJ SOLN
INTRAMUSCULAR | Status: AC
  Administered 2013-01-07: 0.5 mg
  Filled 2013-01-07: qty 1

## 2013-01-07 MED ORDER — PROTAMINE SULFATE 10 MG/ML IV SOLN
INTRAVENOUS | Status: DC | PRN
  Administered 2013-01-07: 50 mg via INTRAVENOUS

## 2013-01-07 MED ORDER — PANTOPRAZOLE SODIUM 40 MG PO TBEC
40.0000 mg | DELAYED_RELEASE_TABLET | Freq: Every day | ORAL | Status: DC
  Administered 2013-01-09 – 2013-01-14 (×6): 40 mg via ORAL
  Filled 2013-01-07 (×6): qty 1

## 2013-01-07 MED ORDER — PHENOL 1.4 % MT LIQD: 1.0000 | OROMUCOSAL | Status: DC | PRN

## 2013-01-07 MED ORDER — POTASSIUM CHLORIDE CRYS ER 20 MEQ PO TBCR: 20.0000 meq | EXTENDED_RELEASE_TABLET | Freq: Once | ORAL | Status: AC | PRN

## 2013-01-07 MED ORDER — BISACODYL 10 MG RE SUPP: 10.0000 mg | Freq: Every day | RECTAL | Status: DC | PRN

## 2013-01-07 MED ORDER — PROPOFOL 10 MG/ML IV BOLUS
INTRAVENOUS | Status: DC | PRN
  Administered 2013-01-07: 200 mg via INTRAVENOUS

## 2013-01-07 MED ORDER — VECURONIUM BROMIDE 10 MG IV SOLR
INTRAVENOUS | Status: DC | PRN
  Administered 2013-01-07 (×2): 2 mg via INTRAVENOUS
  Administered 2013-01-07: 4 mg via INTRAVENOUS
  Administered 2013-01-07: 2 mg via INTRAVENOUS
  Administered 2013-01-07: 1 mg via INTRAVENOUS

## 2013-01-07 MED ORDER — LABETALOL HCL 5 MG/ML IV SOLN
10.0000 mg | INTRAVENOUS | Status: DC | PRN
  Administered 2013-01-07 (×2): 10 mg via INTRAVENOUS
  Filled 2013-01-07 (×2): qty 4

## 2013-01-07 MED ORDER — LACTATED RINGERS IV SOLN
INTRAVENOUS | Status: DC | PRN
  Administered 2013-01-07 (×2): via INTRAVENOUS

## 2013-01-07 MED ORDER — ENOXAPARIN SODIUM 30 MG/0.3ML ~~LOC~~ SOLN
30.0000 mg | SUBCUTANEOUS | Status: DC
  Administered 2013-01-08: 30 mg via SUBCUTANEOUS
  Filled 2013-01-07 (×2): qty 0.3

## 2013-01-07 MED ORDER — ATORVASTATIN CALCIUM 10 MG PO TABS
10.0000 mg | ORAL_TABLET | Freq: Every day | ORAL | Status: DC
  Administered 2013-01-09 – 2013-01-14 (×6): 10 mg via ORAL
  Filled 2013-01-07 (×7): qty 1

## 2013-01-07 MED ORDER — MORPHINE SULFATE 4 MG/ML IJ SOLN
INTRAMUSCULAR | Status: AC
  Administered 2013-01-07: 4 mg
  Filled 2013-01-07: qty 1

## 2013-01-07 MED ORDER — LACTATED RINGERS IV SOLN
INTRAVENOUS | Status: DC | PRN
  Administered 2013-01-07 (×2): via INTRAVENOUS

## 2013-01-07 MED ORDER — SODIUM CHLORIDE 0.9 % IV SOLN: 500.0000 mL | Freq: Once | INTRAVENOUS | Status: AC | PRN

## 2013-01-07 MED ORDER — OXYCODONE-ACETAMINOPHEN 5-325 MG PO TABS
1.0000 | ORAL_TABLET | ORAL | Status: DC | PRN
  Administered 2013-01-07 (×2): 1 via ORAL
  Administered 2013-01-08 – 2013-01-09 (×2): 2 via ORAL
  Filled 2013-01-07 (×2): qty 2
  Filled 2013-01-07 (×2): qty 1

## 2013-01-07 MED ORDER — ONDANSETRON HCL 4 MG/2ML IJ SOLN
INTRAMUSCULAR | Status: DC | PRN
  Administered 2013-01-07: 4 mg via INTRAVENOUS

## 2013-01-07 MED ORDER — GUAIFENESIN-DM 100-10 MG/5ML PO SYRP: 15.0000 mL | ORAL_SOLUTION | ORAL | Status: DC | PRN

## 2013-01-07 SURGICAL SUPPLY — 67 items
BLADE SURG ROTATE 9660 (MISCELLANEOUS) ×2 IMPLANT
CANISTER SUCTION 2500CC (MISCELLANEOUS) ×2 IMPLANT
CLIP TI MEDIUM 24 (CLIP) ×2 IMPLANT
CLIP TI WIDE RED SMALL 24 (CLIP) ×2 IMPLANT
COVER PROBE W GEL 5X96 (DRAPES) ×2 IMPLANT
COVER SURGICAL LIGHT HANDLE (MISCELLANEOUS) ×2 IMPLANT
DERMABOND ADHESIVE PROPEN (GAUZE/BANDAGES/DRESSINGS) ×1
DERMABOND ADVANCED (GAUZE/BANDAGES/DRESSINGS) ×1
DERMABOND ADVANCED .7 DNX12 (GAUZE/BANDAGES/DRESSINGS) ×1 IMPLANT
DERMABOND ADVANCED .7 DNX6 (GAUZE/BANDAGES/DRESSINGS) ×1 IMPLANT
DRAPE WARM FLUID 44X44 (DRAPE) ×2 IMPLANT
DRSG COVADERM 4X8 (GAUZE/BANDAGES/DRESSINGS) ×4 IMPLANT
ELECT BLADE 4.0 EZ CLEAN MEGAD (MISCELLANEOUS)
ELECT BLADE 6.5 EXT (BLADE) ×2 IMPLANT
ELECT REM PT RETURN 9FT ADLT (ELECTROSURGICAL) ×2
ELECTRODE BLDE 4.0 EZ CLN MEGD (MISCELLANEOUS) IMPLANT
ELECTRODE REM PT RTRN 9FT ADLT (ELECTROSURGICAL) ×1 IMPLANT
GEL ULTRASOUND 20GR AQUASONIC (MISCELLANEOUS) ×2 IMPLANT
GLOVE BIO SURGEON STRL SZ7 (GLOVE) ×4 IMPLANT
GLOVE BIOGEL PI IND STRL 6.5 (GLOVE) ×1 IMPLANT
GLOVE BIOGEL PI IND STRL 7.5 (GLOVE) ×3 IMPLANT
GLOVE BIOGEL PI INDICATOR 6.5 (GLOVE) ×1
GLOVE BIOGEL PI INDICATOR 7.5 (GLOVE) ×3
GLOVE SS BIOGEL STRL SZ 7 (GLOVE) ×3 IMPLANT
GLOVE SUPERSENSE BIOGEL SZ 7 (GLOVE) ×3
GOWN STRL NON-REIN LRG LVL3 (GOWN DISPOSABLE) ×8 IMPLANT
GOWN STRL REIN XL XLG (GOWN DISPOSABLE) ×4 IMPLANT
GRAFT HEMASHIELD 14X8MM (Vascular Products) ×2 IMPLANT
INSERT FOGARTY 61MM (MISCELLANEOUS) ×2 IMPLANT
INSERT FOGARTY SM (MISCELLANEOUS) ×4 IMPLANT
KIT BASIN OR (CUSTOM PROCEDURE TRAY) ×2 IMPLANT
KIT ROOM TURNOVER OR (KITS) ×2 IMPLANT
LOOP VESSEL MINI RED (MISCELLANEOUS) ×4 IMPLANT
NS IRRIG 1000ML POUR BTL (IV SOLUTION) ×4 IMPLANT
PACK AORTA (CUSTOM PROCEDURE TRAY) ×2 IMPLANT
PAD ARMBOARD 7.5X6 YLW CONV (MISCELLANEOUS) ×4 IMPLANT
PENCIL BUTTON HOLSTER BLD 10FT (ELECTRODE) ×2 IMPLANT
RETAINER VISCERA MED (MISCELLANEOUS) ×2 IMPLANT
SPONGE SURGIFOAM ABS GEL 100 (HEMOSTASIS) IMPLANT
STAPLER VISISTAT 35W (STAPLE) ×2 IMPLANT
SUT ETHIBOND 5 LR DA (SUTURE) IMPLANT
SUT MNCRL AB 4-0 PS2 18 (SUTURE) ×4 IMPLANT
SUT PDS AB 1 TP1 96 (SUTURE) ×4 IMPLANT
SUT PROLENE 2 0 MH 48 (SUTURE) IMPLANT
SUT PROLENE 3 0 SH 48 (SUTURE) IMPLANT
SUT PROLENE 3 0 SH1 36 (SUTURE) ×6 IMPLANT
SUT PROLENE 4 0 RB 1 (SUTURE) ×1
SUT PROLENE 4-0 RB1 18X2 ARM (SUTURE) ×1 IMPLANT
SUT PROLENE 5 0 C 1 24 (SUTURE) ×4 IMPLANT
SUT PROLENE 5 0 C 1 36 (SUTURE) IMPLANT
SUT SILK 2 0 (SUTURE) ×1
SUT SILK 2 0 SH CR/8 (SUTURE) ×2 IMPLANT
SUT SILK 2 0 TIES 17X18 (SUTURE) ×1
SUT SILK 2-0 18XBRD TIE 12 (SUTURE) ×1 IMPLANT
SUT SILK 2-0 18XBRD TIE BLK (SUTURE) ×1 IMPLANT
SUT SILK 3 0 (SUTURE) ×1
SUT SILK 3 0 TIES 17X18 (SUTURE) ×1
SUT SILK 3-0 18XBRD TIE 12 (SUTURE) ×1 IMPLANT
SUT SILK 3-0 18XBRD TIE BLK (SUTURE) ×1 IMPLANT
SUT VIC AB 2-0 CT1 36 (SUTURE) ×6 IMPLANT
SUT VIC AB 3-0 SH 27 (SUTURE) ×1
SUT VIC AB 3-0 SH 27X BRD (SUTURE) ×1 IMPLANT
TOWEL BLUE STERILE X RAY DET (MISCELLANEOUS) ×4 IMPLANT
TOWEL OR 17X24 6PK STRL BLUE (TOWEL DISPOSABLE) ×4 IMPLANT
TOWEL OR 17X26 10 PK STRL BLUE (TOWEL DISPOSABLE) ×4 IMPLANT
TRAY FOLEY CATH 16FRSI W/METER (SET/KITS/TRAYS/PACK) ×2 IMPLANT
WATER STERILE IRR 1000ML POUR (IV SOLUTION) ×4 IMPLANT

## 2013-01-07 NOTE — Interval H&P Note (Signed)
[facility] [place]  History and Physical Update  The patient was interviewed and re-examined.  The patient's previous History and Physical has been reviewed and is unchanged except for: worsening of rest pain sx over the weekend.  At this point, the timeframe for the aortobifemoral bypass has been moved up to 10 DEC 14.  The patient is aware the risks of aortic surgery include but are not limited to: infection, bleeding, need for transfusion, infection, death, stroke, paralysis, wound complications, bowel injuries, impotence, bowel ischemia, extended ventilation and future ventral hernias.  Overall, I cited a mortality rate of 5-10% and morbidity rate of 30%.  The patient is aware his preoperative cardiac work-up is incomplete and that he has a definite risk of coronary artery disease and subsequent sequalae including myocardial infarction.  This patient has minimal perfusion of his legs via lumbar branches from his distal aorta.  His increasing sx reflect the worsening of collateral flow, so he is at considerable risk for bilateral leg acute ischemia and subsequent limb loss.  The patient is willing accept the risk involved with this case to try to salvage his legs.  Leonides Sake, MD [facility] [place] Office: [phone] Pager: [phone]  01/07/2013, 7:00 AM

## 2013-01-07 NOTE — Anesthesia Postprocedure Evaluation (Signed)
  Anesthesia Post-op Note  Patient: Tommy Malone  Procedure(s) Performed: Procedure(s): AORTA BIFEMORAL BYPASS GRAFT (N/A)  Patient Location: PACU  Anesthesia Type:General  Level of Consciousness: awake and alert   Airway and Oxygen Therapy: Patient Spontanous Breathing  Post-op Pain: mild  Post-op Assessment: Post-op Vital signs reviewed, Patient's Cardiovascular Status Stable, Respiratory Function Stable, Patent Airway, No signs of Nausea or vomiting and Pain level controlled  Post-op Vital Signs: Reviewed and stable  Complications: No apparent anesthesia complications

## 2013-01-07 NOTE — Anesthesia Preprocedure Evaluation (Addendum)
Anesthesia Evaluation  Patient identified by MRN, date of birth, ID band Patient awake    Reviewed: Allergy & Precautions, H&P , NPO status , Patient's Chart, lab work & pertinent test results  History of Anesthesia Complications Negative for: history of anesthetic complications  Airway Mallampati: I TM Distance: >3 FB Neck ROM: Full    Dental  (+) Teeth Intact   Pulmonary COPDCurrent Smoker,  + rhonchi         Cardiovascular + Peripheral Vascular Disease Rhythm:Regular Rate:Normal     Neuro/Psych PSYCHIATRIC DISORDERS Anxiety Depression    GI/Hepatic GERD-  Medicated and Controlled,  Endo/Other    Renal/GU      Musculoskeletal   Abdominal Normal abdominal exam  (+)   Peds  Hematology negative hematology ROS (+)   Anesthesia Other Findings   Reproductive/Obstetrics                          Anesthesia Physical Anesthesia Plan  ASA: III  Anesthesia Plan: General   Post-op Pain Management:    Induction: Intravenous  Airway Management Planned: Oral ETT  Additional Equipment: Arterial line and CVP  Intra-op Plan:   Post-operative Plan: Extubation in OR  Informed Consent: I have reviewed the patients History and Physical, chart, labs and discussed the procedure including the risks, benefits and alternatives for the proposed anesthesia with the patient or authorized representative who has indicated his/her understanding and acceptance.   Dental advisory given  Plan Discussed with: CRNA and Surgeon  Anesthesia Plan Comments:        Anesthesia Quick Evaluation

## 2013-01-07 NOTE — Progress Notes (Signed)
md notified of ABG results  Ph: 7.281 PCO2: 54.1 Po2: 156 Bicarb: 25.4 Sat 99%  Instructed to work on IS, and if patients o2 sats drop will most likely need bipap  Will cont. To monitor and assess patient

## 2013-01-07 NOTE — Transfer of Care (Signed)
Immediate Anesthesia Transfer of Care Note  Patient: Tommy Malone  Procedure(s) Performed: Procedure(s): AORTA BIFEMORAL BYPASS GRAFT (N/A)  Patient Location: PACU  Anesthesia Type:General  Level of Consciousness: awake, alert  and oriented  Airway & Oxygen Therapy: Patient Spontanous Breathing  Post-op Assessment: Report given to PACU RN  Post vital signs: Reviewed and stable  Complications: No apparent anesthesia complications

## 2013-01-07 NOTE — Preoperative (Signed)
Beta Blockers   Reason not to administer Beta Blockers:Not Applicable 

## 2013-01-07 NOTE — H&P (View-Only) (Signed)
[facility] [place]  Established Intermittent Claudication  History of Present Illness  Tommy Malone is a 47 y.o. (Nov 06, 1965) male who presents with chief complaint: short distance claudication.  The patient's symptoms have progressed.  The patient's symptoms are: very short distance claudication.  His recently diagnostic aortogram with bilateral runoff demonstrated essentially distal aortic occlusion with collateral pelvic filling via distal lumbars.  The patient's treatment regimen currently included: maximal medical management.  The patient is scheduled to see a Cardiologist today for preoperative risk stratification and optimization for a possible aortobifemoral graft.  Past Medical History   Diagnosis  Date   .  Acid reflux    .  Kidney stones    .  Depression    .  Anxiety    L subclavian steal  Erectile dysfunction. Past Surgical History   Procedure  Laterality  Date   .  Elbow surgery     .  Kidney stone surgery      History    Social History   .  Marital Status:  Legally Separated     Spouse Name:  N/A     Number of Children:  N/A   .  Years of Education:  N/A    Occupational History   .  Not on file.    Social History Main Topics   .  Smoking status:  Current Every Day Smoker -- 1.00 packs/day for 30 years     Types:  Cigarettes   .  Smokeless tobacco:  Never Used   .  Alcohol Use:  No      Comment: occasional   .  Drug Use:  No   .  Sexual Activity:  Not on file    Other Topics  Concern   .  Not on file    Social History Narrative   .  No narrative on file    Family History   Problem  Relation  Age of Onset   .  Heart disease  Mother    .  Heart attack  Mother    .  Hyperlipidemia  Mother    .  Heart disease  Maternal Grandmother    .  Cancer  Paternal Grandfather    .  Heart disease  Father    .  Heart attack  Father    .  Hyperlipidemia  Father     Current Outpatient Prescriptions on File Prior to Visit   Medication  Sig   Dispense  Refill   .  clonazePAM (KLONOPIN) 0.5 MG tablet  Take 0.5 mg by mouth 2 (two) times daily as needed for anxiety.     [name]  omeprazole (PRILOSEC) 20 MG capsule  Take 20 mg by mouth daily.     [name]  PRESCRIPTION MEDICATION  Ambilify 2 mg 1 tablet every morning     .  sertraline (ZOLOFT) 100 MG tablet  Take 100 mg by mouth daily.     [name]  amoxicillin-clavulanate (AUGMENTIN) 875-125 MG per tablet  Take 1 tablet by mouth 2 (two) times daily.  20 tablet  0   .  baclofen (LIORESAL) 10 MG tablet  Take 10 mg by mouth 3 (three) times daily.     .  DULoxetine (CYMBALTA) 60 MG capsule  Take 60 mg by mouth daily.     .  fluticasone (FLONASE) 50 MCG/ACT nasal spray  Place 2 sprays into the nose daily.  16 g  3   .  ipratropium (ATROVENT) 0.03 % nasal spray  Place 2 sprays into the nose 2 (two) times daily.  30 mL  1   .  pregabalin (LYRICA) 50 MG capsule  Take 50 mg by mouth 3 (three) times daily.      No current facility-administered medications on file prior to visit.    No Known Allergies   REVIEW OF SYSTEMS: (Positives checked otherwise negative)  CARDIOVASCULAR: [ ]  chest pain, [ ]  chest pressure, [ ]  palpitations, [ ]  shortness of breath when laying flat, [ ]  shortness of breath with exertion, [x]  pain in feet when walking, [ ]  pain in feet when laying flat, [ ]  history of blood clot in veins (DVT), [ ]  history of phlebitis, [ ]  swelling in legs, [ ]  varicose veins  PULMONARY: [ ]  productive cough, [ ]  asthma, [ ]  wheezing  NEUROLOGIC: [ ]  weakness in arms or legs, [ ]  numbness in arms or legs, [ ]  difficulty speaking or slurred speech, [ ]  temporary loss of vision in one eye, [ ]  dizziness  HEMATOLOGIC: [ ]  bleeding problems, [ ]  problems with blood clotting too easily  MUSCULOSKEL: [ ]  joint pain, [ ]  joint swelling  GASTROINTEST: [ ]  Vomiting blood, [ ]  Blood in stool  GENITOURINARY: [ ]  Burning with urination, [ ]  Blood in urine  PSYCHIATRIC: [x]  history of major depression  INTEGUMENTARY: [ ]   rashes, [ ]  ulcers  CONSTITUTIONAL: [ ]  fever, [ ]  chills   For VQI Use Only  PRE-ADM LIVING: Home  AMB STATUS: Ambulatory  CAD Sx: None  PRIOR CHF: None  STRESS TEST: [x]  No, [ ]  Normal, [ ]  + ischemia, [ ]  + MI, [ ]  Both   Physical Examination  Filed Vitals:   01/02/13 0834  BP: 107/68  Pulse: 77  Height: 5\' 9"  (1.753 m)  Weight: 164 lb 9.6 oz (74.662 kg)  SpO2: 100%   Body mass index is 24.3 kg/(m^2).  General: A&O x 3, WDWN   Eyes: PERRLA, EOMI   Neck: Supple, no nuchal rigidity, no palpable LAD   Pulmonary: Sym exp, good air movt, CTAB, no rales, rhonchi, & wheezing   Cardiac: RRR, Nl S1, S2, no Murmurs, rubs or gallops   Vascular:  Vessel  Right  Left   Radial  Palpable  Palpable   Brachial  Palpable  Palpable   Carotid  Palpable, without bruit  Palpable, without bruit   Aorta  Not palpable  N/A   Femoral  Not Palpable  Weakly Palpable   Popliteal  Not palpable  Not palpable   PT  Not Palpable  Not Palpable   DP  Not Palpable  Not Palpable    Gastrointestinal: soft, NTND, -G/R, - HSM, - masses, - CVAT B   Musculoskeletal: M/S 5/5 throughout , Extremities without ischemic changes , no ulcers or gangrene   Neurologic: CN 2-12 intact , Pain and light touch intact in extremities , Motor exam as listed above   Medical Decision Making  Tommy Malone is a 47 y.o. male who presents with:  bilateral leg lifestyle limiting intermittent claudication without evidence of critical limb ischemia currently   The patient is likely to present in the future with acute leg ischemia given the tenuous blood supply to his legs at this point.    Based on the patient's vascular studies and examination, I have offered the patient: aortobifemoral bypass grafting.  Without any chance of retrograde blood flow into the iliac arteries, I  am planning an end to side proximal anastomosis to avoid terminating the pelvic blood flow.  I discussed in depth with the patient the nature of  atherosclerosis, and emphasized the importance of maximal medical management including strict control of blood pressure, blood glucose, and lipid levels, antiplatelet agents, obtaining regular exercise, and cessation of smoking.    The patient is aware that without maximal medical management the underlying atherosclerotic disease process will progress, limiting the benefit of any interventions. The patient is currently on a statin: Lipitor.   The patient is currently not on an anti-platelet as it will potentiate bleeding for an operation with significant expected blood loss. Tenatively, the patient is scheduled for the 6th of January 2015, pending any additional delay due to his cardiology work-up.  Thank you for allowing [place] to participate in this patient's care.  Leonides Sake, MD [facility] [place] Office: [phone] Pager: [phone]  01/02/2013, 4:34 PM

## 2013-01-07 NOTE — Op Note (Signed)
OPERATIVE NOTE    PROCEDURE: 1. Aortobifemoral bypass  PRE-OPERATIVE DIAGNOSIS: Bilateral leg rest pain, aortoiliac occlusive disease  POST-OPERATIVE DIAGNOSIS: same as above   SURGEON: Leonides Sake, MD  ASSISTANT(S): Dr. Josephina Gip, Lianne Cure, [facility]   ANESTHESIA: general  ESTIMATED BLOOD LOSS: 400 cc  UOP: 410 cc  FINDING(S): 1. Thrombus in aorta extending from perirenal segment to infrarenal segment 2. No obvious thrombus at orifice of renal arteries 3. Dopplerable pedal arteries throughout  SPECIMEN(S):  none  INDICATIONS:   Tommy Malone is a 47 y.o. male who presents with seven year history of intermittent claudication.  He recently underwent an aortogram which demonstrated occlusion of right common iliac artery and near occlusion of left common iliac artery.  Over the next one to two weeks after his aortogram, he begaan developing frank rest pain in both legs.  Given his very tenuous perfusion to both legs, I felt he was at high risk of acute arterial occlusion in both legs, so I recommended we proceed with aortobifemoral bypass.  The patient is aware the risks of aortic surgery include but are not limited to: bleeding, need for transfusion, infection, death, stroke, paralysis, wound complications, bowel injuries, impotence, bowel ischemia, extended ventilation and future ventral hernias.  Overall, I cited a mortality rate of 5-10% and morbidity rate of 30%.  DESCRIPTION: After obtaining full informed written consent, the patient was brought back to the operating room and placed supine upon the operating table.  The patient received IV antibiotics prior to induction.  After obtaining adequate anesthesia, the patient was prepped and draped in the standard fashion for: an open aortic procedure.  Using a Sonosite, I identified the location for both common femoral arteries and marked them on the skin.  I made a longitudinal incision in the right groin and then dissected out  the common femoral artery with electrocautery.  Circumflex branches were dissected out and controlled with vessel loops.  I had the proximal common femoral artery dissected out to the inguinal ligament.  Distally, I had enough length for an anastomosis to the common femoral artery.  I turned my attention to the left groin.  In a similar fashion, an incision was made and the common femoral artery was dissected out with circumflex branches.  I bluntly dissected from both groin immediately on top of each external iliac artery.  Each external iliac artery was several attenuated and calcified.    I then turned my attention to the abdomen.  I made an incision from the xiphoid to suprapubic regimen with a 10 blade and then dissected out the fascia along the entire incision.  In the superior segment, I opened the fascia with electrocautery and then opened a window in the preperitoneal fat blunt with electrocautery.  This allowed me to visualize the peritoneum, which I sharply opened.  I completely opened the peritoneal cavity from the xiphoid to the suprapubic region, taking care to remain in the midline supraumbilically.  I placed a Balfour retractor with wet lap pads to obtain exposure of the abdomen.  I eviscerated the transverse colon into a wet towel.  I then eviscerated the small bowel into a wet towel to the patient's right.  There were some adhesions of the duodenum to retroperitoneum which I took down sharply, taking care to avoid the duodenum.  I then place the Omni retractor and placed retractor to gain further exposure of the aorta.  I dissected out the intrarenal aorta with electrocautery and blunt  dissection.  In this process, I identified the left renal vein, which I retracted out of the way.  I was able to identify both renal arteries.  I dissected out ~5 cm of the aorta immediately distal to the renal arteries.  I passed an umbilical tape behind the aorta at this segment.  At this point, I continued my  dissection distally along the anterior wall of the aorta, until I could identify the aortic bifurcation,  I briefly dissected out the proximal common iliac artery bilaterally.  I then developed a tunnel bilaterally, immediately on top the external iliac artery.  On the right side, I was able to create a tunnel bluntly from the right groin and right pelvic approach, immediately on top the external iliac artery.  I passed dressing clamp from the right groin up to the right pelvis and pulled an umbilical tape through the bluntly dissected tunnel.  In a similar fashion, I developed a tunnel immediately on top the left external iliac artery and passed the dressing clamp.  I pulled an umbilical tape through tunnel.    At this point, I gave the patient 15 g of Mannitol and then gave the patient 7000 units of Heparin intravenously, which was a therapeutic bolus.  After waiting three minutes, I clamped the aorta immediately distal to the renal arteries with a Harken clamp and 5 cm distal to the proximal clamp with a Debakey clamp.  I transected the aorta 2 cm distal to the aortic clamp.  Immediately some thrombus was evident in the lumen of the aorta.  I transected the aorta also proximal to the distal clamp, leaving a cuff of aortic tissue.  I transected the intervening residual aorta to make space for the aortic graft.  I oversewed the distal aorta with a double layer of 3-0 Prolene.  At this point, I had concerns given the unexpected thrombus present in the aorta.  I placed a Debakey clamp suprarenal and clamped briefly the aorta.  I removed the Harken graft and there was some thrombus present with atherosclerotic plaque present in the perirenal segment.  I removed the thrombus and loose plaque.  Neither renal orifice appeared involved.  I flushed out the aorta by releasing the clamp.  Some thrombus was flushed with this maneuver.  I repeated this once and no further thrombus was obtained.  I flushed out the proximal  aorta with heparinized saline and no further thrombus or loose atherosclerotic plaque was present.  Neither renal orifice was involved.  I replaced the Harken clamp to the infrarenal position.  The suprarenal aorta was clamped with a total of 5 minutes.  I then used aortic spacers to measure the aorta, and it appeared to 1.4 cm in diameter so I selected a 14 mm x 8 mm aortobifemoral Dacron graft.  I shorted the body of the graft and sewed the graft to the infrarenal aorta with a 3-0 Prolene in an end-to-end fashion.  Prior to completing this anastomosis, I pulled up on the suture line with a nerve hook.    I placed a Fogarty clamp distal to the anastomosis, and released the clamp.  There was minimal bleeding from the suture line.  At this point, I unclamped the body of the graft to decompress the graft.  No thrombus was identified in either aortobifemoral limb.  I placed straight Fogarty clamps on both limbs.  Each limb was tied to an umbilical tape and delivered through the appropriate iliac tunnel, taking care to  maintain orientation of each aortobifemoral limb.  Both limb was pulled to appropriate tension in the tunnel.    Dr. Hart Rochester turned his attention to the right common femoral artery.  All circumflex branches were controlled with vessel loops traction.  The right external iliac artery and distal common femoral artery were clamped and an arteriotomy made with a 11 blade.  The arteriotomy was extended proximally and distally.  The right graft limb was pulled to appropriate tension and sharply spatulated, adjusting the length in the process.  The graft was sewn to the common femoral artery in an end-to-side configuration with a running stitch of 5-0 Prolene.  Prior to completing this anastomosis, the common femoral artery was backbled and some bleeding without any thrombus was present.  The graft was bled also and no thrombus was present.  The anastomosis was completed in the usual fashion and all clamps and  vessel loops removed.  In a similar fashion, I completed the left femoral anastomosis.  The left common femoral artery was opened: no thrombus was present but some limited anterior plaquing was noted.  There also appeared to be limited posterior plaque present also.  The left graft limb was sewn to the left common femoral artery in an end-to-side confiiguration with a running stitch of 5-0 Prolene.  Prior completing this anastomosis, the common femoral was backbled and bleeding from both end was present despite the thrombus found in the aorta.  I also bled the graft limb and no thrombus was present.  I completed this anastomosis in the usual fashion.  All clamps and vessel loops were removed.  Both common femoral artery were interrogated with a dopplerable and appropriate signals were present.   I gave the patient 50 mg of Protamine to reverse anticoagulation.  After packing both groins and the abdomen and waiting 5 minutes, no active bleeding was present.  Both groins were repaired with a double layer of 2-0 Vicryl, then a double layer of 3-0 Vicryl, and finally a running subcuticular of 4-0 Monocryl.  The skin was cleaned, dried, and reinforced with Dermabond on both sides.  The retroperitoneum was repaired with a running stitch of 2-0 Vicryl in order to avoid bowel herniation into the retroperitoneum.  I then reapproximated the fascia with two running stitched of double looped 1 PDS, tying in the middle.  The skin was reapproximated with staples.  At the end of this case, the patient had faintly palpable dorsalis pedis pulses with dopplerable dorsalis pedis and posterior tibial artery signals.  COMPLICATIONS: none  CONDITION: stable  Leonides Sake, MD [facility] [place] Office: [phone] Pager: [phone]  01/07/2013, 1:14 PM

## 2013-01-08 ENCOUNTER — Encounter (HOSPITAL_COMMUNITY): Payer: Self-pay | Admitting: Vascular Surgery

## 2013-01-08 ENCOUNTER — Inpatient Hospital Stay (HOSPITAL_COMMUNITY): Payer: PRIVATE HEALTH INSURANCE

## 2013-01-08 LAB — LIPID PANEL
Cholesterol: 158 mg/dL (ref 0–200)
HDL: 32 mg/dL — ABNORMAL LOW (ref >39)
Triglycerides: 76 mg/dL (ref <150)
VLDL: 15 mg/dL (ref 0–40)

## 2013-01-08 LAB — AMYLASE: Amylase: 32 U/L (ref 0–105)

## 2013-01-08 LAB — POCT I-STAT 3, ART BLOOD GAS (G3+)
Bicarbonate: 24.6 mEq/L — ABNORMAL HIGH (ref 20.0–24.0)
TCO2: 26 mmol/L (ref 0–100)
pCO2 arterial: 43.8 mmHg (ref 35.0–45.0)
pH, Arterial: 7.359 (ref 7.350–7.450)
pO2, Arterial: 73 mmHg — ABNORMAL LOW (ref 80.0–100.0)

## 2013-01-08 LAB — COMPREHENSIVE METABOLIC PANEL
ALT: 18 U/L (ref 0–53)
Albumin: 3.2 g/dL — ABNORMAL LOW (ref 3.5–5.2)
BUN: 9 mg/dL (ref 6–23)
CO2: 22 mEq/L (ref 19–32)
Chloride: 99 mEq/L (ref 96–112)
GFR calc Af Amer: >90 mL/min (ref >90)
GFR calc non Af Amer: >90 mL/min (ref >90)
Potassium: 4.3 mEq/L (ref 3.5–5.1)

## 2013-01-08 LAB — CBC
HCT: 46 % (ref 39.0–52.0)
MCHC: 34.8 g/dL (ref 30.0–36.0)
Platelets: 121 10*3/uL — ABNORMAL LOW (ref 150–400)
RBC: 5.17 MIL/uL (ref 4.22–5.81)
RDW: 13.3 % (ref 11.5–15.5)
WBC: 13.3 10*3/uL — ABNORMAL HIGH (ref 4.0–10.5)

## 2013-01-08 LAB — MAGNESIUM: Magnesium: 1.3 mg/dL — ABNORMAL LOW (ref 1.5–2.5)

## 2013-01-08 MED ORDER — ONDANSETRON HCL 4 MG/2ML IJ SOLN: 4.0000 mg | Freq: Four times a day (QID) | INTRAMUSCULAR | Status: DC | PRN

## 2013-01-08 MED ORDER — KETOROLAC TROMETHAMINE 30 MG/ML IJ SOLN
30.0000 mg | Freq: Four times a day (QID) | INTRAMUSCULAR | Status: AC | PRN
  Administered 2013-01-08 – 2013-01-09 (×2): 30 mg via INTRAVENOUS
  Filled 2013-01-08 (×2): qty 1

## 2013-01-08 MED ORDER — MAGNESIUM SULFATE 40 MG/ML IJ SOLN
2.0000 g | Freq: Once | INTRAMUSCULAR | Status: AC
  Administered 2013-01-08: 2 g via INTRAVENOUS
  Filled 2013-01-08: qty 50

## 2013-01-08 MED ORDER — ENOXAPARIN SODIUM 40 MG/0.4ML ~~LOC~~ SOLN
40.0000 mg | SUBCUTANEOUS | Status: DC
  Administered 2013-01-09 – 2013-01-14 (×6): 40 mg via SUBCUTANEOUS
  Filled 2013-01-08 (×6): qty 0.4

## 2013-01-08 MED ORDER — SODIUM CHLORIDE 0.9 % IJ SOLN: 9.0000 mL | INTRAMUSCULAR | Status: DC | PRN

## 2013-01-08 MED ORDER — DIPHENHYDRAMINE HCL 50 MG/ML IJ SOLN: 12.5000 mg | Freq: Four times a day (QID) | INTRAMUSCULAR | Status: DC | PRN

## 2013-01-08 MED ORDER — MORPHINE SULFATE (PF) 1 MG/ML IV SOLN
INTRAVENOUS | Status: DC
  Administered 2013-01-08: 3 mg via INTRAVENOUS
  Administered 2013-01-08: 10.5 mg via INTRAVENOUS
  Administered 2013-01-08: 12 mg via INTRAVENOUS
  Administered 2013-01-08: 9 mg via INTRAVENOUS
  Administered 2013-01-08 – 2013-01-09 (×3): via INTRAVENOUS
  Administered 2013-01-09: 24.9 mg via INTRAVENOUS
  Administered 2013-01-09: 10.35 mg via INTRAVENOUS
  Administered 2013-01-09: 15 mg via INTRAVENOUS
  Administered 2013-01-09: 03:00:00 via INTRAVENOUS
  Administered 2013-01-09: 9 mg via INTRAVENOUS
  Administered 2013-01-09: 6 mg via INTRAVENOUS
  Administered 2013-01-09: 7.5 mg via INTRAVENOUS
  Administered 2013-01-10: 15.96 mg via INTRAVENOUS
  Administered 2013-01-10: 06:00:00 via INTRAVENOUS
  Administered 2013-01-10: 15 mg via INTRAVENOUS
  Administered 2013-01-10: 20:00:00 via INTRAVENOUS
  Administered 2013-01-10: 7.5 mg via INTRAVENOUS
  Administered 2013-01-10: 13.6 mg via INTRAVENOUS
  Administered 2013-01-10: 11:00:00 via INTRAVENOUS
  Administered 2013-01-10: 9 mg via INTRAVENOUS
  Administered 2013-01-11: 15 mg via INTRAVENOUS
  Administered 2013-01-11: 9 mg via INTRAVENOUS
  Filled 2013-01-08 (×9): qty 25

## 2013-01-08 MED ORDER — NALOXONE HCL 0.4 MG/ML IJ SOLN: 0.4000 mg | INTRAMUSCULAR | Status: DC | PRN

## 2013-01-08 MED ORDER — NICOTINE 21 MG/24HR TD PT24
21.0000 mg | MEDICATED_PATCH | Freq: Every day | TRANSDERMAL | Status: DC
  Administered 2013-01-08 – 2013-01-14 (×7): 21 mg via TRANSDERMAL
  Filled 2013-01-08 (×7): qty 1

## 2013-01-08 MED ORDER — DIPHENHYDRAMINE HCL 12.5 MG/5ML PO ELIX
12.5000 mg | ORAL_SOLUTION | Freq: Four times a day (QID) | ORAL | Status: DC | PRN
  Filled 2013-01-08: qty 5

## 2013-01-08 MED ORDER — ENOXAPARIN SODIUM 40 MG/0.4ML ~~LOC~~ SOLN
40.0000 mg | SUBCUTANEOUS | Status: DC
  Filled 2013-01-08: qty 0.4

## 2013-01-08 MED FILL — Electrolyte-R (PH 7.4) Solution: INTRAVENOUS | Qty: 1000 | Status: AC

## 2013-01-08 MED FILL — Sodium Chloride IV Soln 0.9%: INTRAVENOUS | Qty: 2000 | Status: AC

## 2013-01-08 NOTE — Progress Notes (Signed)
UR Completed.  Tommy Malone Jane [phone] 01/08/2013  

## 2013-01-08 NOTE — Progress Notes (Addendum)
[facility] [place]  Subjective  - Pain is an issue.  Otherwise just sore throat and dry mouth.  Patient states his feet and legs feel better  Objective 130/80 89 99.9 F (37.7 C) (Core (Comment)) 20 96%  Intake/Output Summary (Last 24 hours) at 01/08/13 0735 Last data filed at 01/08/13 0600  Gross per 24 hour  Intake   6200 ml  Output   3410 ml  Net   2790 ml    Palpable DP pulses Dressings clean and dry.  Assessment/Planning: POD #Aorta by-fem by-pass  D/C foley, NG tube (maintain NPO), swan Start PCA and Toradol times 3 doses. Nicotine patch q 24 hours  Clinton Gallant [facility] 01/08/2013 7:35 AM --  Laboratory Lab Results:  Recent Labs  01/07/13 1316 01/08/13 0500  WBC 13.3* 13.3*  HGB 15.2 16.0  HCT 44.1 46.0  PLT 131* 121*   BMET  Recent Labs  01/07/13 1316 01/08/13 0500  NA 136 133*  K 4.2 4.3  CL 101 99  CO2 25 22  GLUCOSE 116* 119*  BUN 12 9  CREATININE 0.86 0.82  CALCIUM 8.2* 8.2*    COAG Lab Results  Component Value Date   INR 0.98 01/07/2013   INR 0.86 01/06/2013   No results found for this basename: PTT   Hepatic Function Panel     Component Value Date/Time   PROT 5.9* 01/08/2013 0500   ALBUMIN 3.2* 01/08/2013 0500   AST 36 01/08/2013 0500   ALT 18 01/08/2013 0500   ALKPHOS 76 01/08/2013 0500   BILITOT 0.7 01/08/2013 0500   Lipid Panel     Component Value Date/Time   CHOL 158 01/08/2013 0500   TRIG 76 01/08/2013 0500   HDL 32* 01/08/2013 0500   CHOLHDL 4.9 01/08/2013 0500   VLDL 15 01/08/2013 0500   LDLCALC 111* 01/08/2013 0500    Addendum  I have independently interviewed and examined the patient, and I agree with the physician assistant's findings.  In acute phase of recover from ABF.  No signs of renal impairment despite perirenal aortic thrombus and brief suprarenal clamping.  Tachycardia is physiologic with appropriate BP at this point.  Ok to D/C [place], Foley, and A-line.  Ok with PCA.   Replete Magnesium.  Keep NPO for now.   Leonides Sake, MD [facility] [place] Office: [phone] Pager: [phone]  01/08/2013, 7:42 AM

## 2013-01-08 NOTE — Evaluation (Signed)
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  Patient will benefit from skilled PT to increase their independence and safety with mobility to allow discharge to the venue listed below. Anticipate pt will progress quickly and should not need further therapy or DME on d/c.      PT Assessment  Patient needs continued PT services    Follow Up Recommendations  No PT follow up    Does the patient have the potential to tolerate intense rehabilitation      Barriers to Discharge        Equipment Recommendations  None recommended by PT    Recommendations for Other Services     Frequency Min 4X/week    Precautions / Restrictions     Pertinent Vitals/Pain 2-3/10 abd at rest; 5-6/10 with activity; patient repositioned for comfort; pt was premedicated BP 115/73 supine       103/68 walking with dizziness        113/73 sitting/reclined      Mobility  Bed Mobility Bed Mobility: Rolling Left;Left Sidelying to Sit;Sitting - Scoot to [place] of Bed Rolling Left: 4: Min assist Left Sidelying to Sit: 4: Min assist;HOB elevated Sitting - Scoot to [place] of Bed: 4: Min guard Details for Bed Mobility Assistance: vc for technique to decr strain on incisions Transfers Transfers: Sit to Stand;Stand to Sit Sit to Stand: 4: Min assist Stand to Sit: 4: Min assist Details for Transfer Assistance: steadying assist; vc for technique to sit as pt with incr pain Ambulation/Gait Ambulation/Gait Assistance: 4: Min assist Ambulation Distance (Feet): 50 Feet Assistive device: 1 person hand held  assist Ambulation/Gait Assistance Details: steady assist due to dizziness Gait Pattern: Step-through pattern;Decreased stride length Gait velocity: significantly decr    Exercises General Exercises - Lower Extremity Ankle Circles/Pumps: AROM;20 reps;Supine   PT Diagnosis: Difficulty walking;Acute pain  PT Problem List: Decreased range of motion;Decreased activity tolerance;Decreased balance;Decreased mobility;Decreased knowledge of use of DME;Cardiopulmonary status limiting activity;Pain PT Treatment Interventions: DME instruction;Gait training;Stair training;Functional mobility training;Therapeutic activities;Therapeutic exercise;Patient/family education     PT Goals(Current goals can be found in the care plan section) Acute Rehab PT Goals Patient Stated Goal: ultimately return to work PT Goal Formulation: With patient Time For Goal Achievement: 01/15/13 Potential to Achieve Goals: Good  Visit Information  Last PT Received On: 01/08/13 Assistance Needed: +1 (2 for lines) PT/OT/SLP Co-Evaluation/Treatment: Yes PT goals addressed during session: Mobility/safety with mobility OT goals addressed during session: ADL's and self-care History of Present Illness: Adm with bilateral LE claudication. 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  Pt was performing all BADLs mod I and ambulating with SPC due to pain.  Communication Communication: No difficulties    Cognition  Cognition Arousal/Alertness:  Awake/alert Behavior During Therapy: WFL for tasks assessed/performed Overall Cognitive Status: Within Functional Limits for tasks assessed    Extremity/Trunk Assessment Upper Extremity Assessment Upper Extremity Assessment: Defer to OT evaluation Lower Extremity Assessment Lower Extremity Assessment: RLE deficits/detail;LLE deficits/detail RLE Deficits / Details: limited hip flexion due to pain; impacting sit to/from stand RLE: Unable to fully assess due to pain RLE Sensation: decreased light touch (reports improved) LLE Deficits / Details: limited hip flexion due to pain; impacting sit to/from stand LLE: Unable to fully assess due to pain LLE Sensation: decreased light touch Cervical / Trunk Assessment Cervical / Trunk Assessment: Other exceptions Cervical / Trunk Exceptions: slightly flexed due to pain   Balance Balance Balance Assessed: Yes Static Standing Balance Static Standing - Balance Support: Right upper extremity supported Static Standing - Level of Assistance: 5: Stand by assistance  End of Session PT - End of Session Activity Tolerance: Treatment limited secondary to medical complications (Comment) (dizziness and decr'g BP) Patient left: in chair;with call bell/phone within reach Nurse Communication: Mobility status;Other (comment) (SaO2 during ambulation)  GP     Blakeley Scheier 01/08/2013, 9:35 AM Pager [phone]

## 2013-01-08 NOTE — Evaluation (Signed)
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  He presents to OT with the below listed deficits and will benefit from continued OT to maximize safety and independence with BADLs.  Currently, he requires mod - max A for BADLs due to pain, but anticipate good progress and return to independence as pain decreases.  No follow up OT needed upon discharge.     OT Assessment  Patient needs continued OT Services    Follow Up Recommendations  No OT follow up;Supervision/Assistance - 24 hour    Barriers to Discharge      Equipment Recommendations  3 in 1 bedside comode    Recommendations for Other Services    Frequency  Min 2X/week    Precautions / Restrictions     Pertinent Vitals/Pain     ADL  Eating/Feeding: Independent Where Assessed - Eating/Feeding: Chair Grooming: Wash/dry hands;Wash/dry face;Teeth care;Set up Where Assessed - Grooming: Supported sitting Upper Body Bathing: Set up Where Assessed - Upper Body Bathing: Supported sitting Lower Body Bathing: Maximal assistance Where Assessed - Lower Body Bathing: Supported sit to stand Upper Body Dressing: Moderate assistance Where Assessed - Upper Body Dressing: Unsupported sitting Lower Body Dressing: Maximal assistance Where Assessed - Lower Body Dressing: Supported sit to stand Toilet Transfer: Minimal assistance Toilet Transfer Method: Sit to stand;Stand pivot Toilet Transfer Equipment: Comfort height toilet;Bedside commode Toileting - Clothing Manipulation and Hygiene: Moderate assistance Where Assessed - Toileting Clothing Manipulation and Hygiene: Standing Equipment Used: Rolling  walker Transfers/Ambulation Related to ADLs: min A ADL Comments: ADLs limited by pain, but anticipate pt will progress quickly, but may need AE    OT Diagnosis: Generalized weakness;Acute pain  OT Problem List: Decreased strength;Decreased activity tolerance;Impaired balance (sitting and/or standing);Decreased knowledge of use of DME or AE;Pain OT Treatment Interventions: Self-care/ADL training;DME and/or AE instruction;Therapeutic activities;Patient/family education   OT Goals(Current goals can be found in the care plan section) Acute Rehab OT Goals Patient Stated Goal: ultimately return to work OT Goal Formulation: With patient Time For Goal Achievement: 01/15/13 Potential to Achieve Goals: Good ADL Goals Pt Will Perform Grooming: with modified independence;standing Pt Will Perform Lower Body Bathing: with modified independence;with adaptive equipment;sit to/from stand Pt Will Perform Upper Body Dressing: with modified independence;sitting Pt Will Perform Lower Body Dressing: with modified independence;with adaptive equipment;sit to/from stand Pt Will Transfer to Toilet: with modified independence;ambulating;regular height toilet;bedside commode Pt Will Perform Toileting - Clothing Manipulation and hygiene: with modified independence;sit to/from stand Pt Will Perform Tub/Shower Transfer: Tub transfer;with min guard assist;rolling walker;ambulating  Visit Information  Last OT Received On: 01/08/13 Assistance Needed: +1 (2 for lines) PT/OT/SLP Co-Evaluation/Treatment: Yes PT goals addressed during session: Mobility/safety with mobility OT goals addressed during session: ADL's and self-care History of Present Illness: 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  Pt was performing all BADLs mod I and ambulating with SPC due to pain.  Communication Communication: No difficulties         Vision/Perception     Cognition  Cognition Arousal/Alertness: Awake/alert Behavior During Therapy: WFL for tasks assessed/performed Overall Cognitive Status: Within Functional Limits for tasks assessed    Extremity/Trunk Assessment Upper Extremity Assessment Upper Extremity Assessment: Defer to OT evaluation Lower Extremity Assessment Lower Extremity Assessment: RLE deficits/detail;LLE deficits/detail RLE Deficits / Details: limited hip flexion due to pain; impacting sit to/from stand RLE: Unable to fully assess due to pain RLE Sensation: decreased light touch (reports improved) LLE Deficits / Details: limited hip flexion due to pain; impacting sit to/from stand LLE: Unable to fully assess due to pain LLE Sensation: decreased light touch Cervical / Trunk Assessment Cervical / Trunk Assessment: Other exceptions Cervical / Trunk Exceptions: slightly flexed due to pain     Mobility Bed Mobility Bed Mobility: Rolling Left;Left Sidelying to Sit;Sitting - Scoot to [place] of Bed Rolling Left: 4: Min assist Left Sidelying to Sit: 4: Min assist;HOB elevated Sitting - Scoot to [place] of Bed: 4: Min guard Details for Bed Mobility Assistance: vc for technique to decr strain on incisions Transfers Sit to Stand: 4: Min assist Stand to Sit: 4: Min assist Details for Transfer Assistance: steadying assist; vc for technique to sit as pt with incr pain     Exercise General Exercises - Lower Extremity Ankle Circles/Pumps: AROM;20 reps;Supine   Balance  Balance Balance Assessed: Yes Static Standing Balance Static Standing - Balance Support: Right upper extremity supported Static Standing - Level of Assistance: 5: Stand by assistance   End of Session OT - End of Session Equipment Utilized During Treatment: Rolling walker Activity Tolerance: Patient limited by pain Patient left: in chair;with call bell/phone within reach Nurse Communication: Mobility status  GO     Tahjae Clausing M 01/08/2013, 11:27 AM

## 2013-01-09 LAB — CBC
HCT: 43.8 % (ref 39.0–52.0)
MCV: 91.1 fL (ref 78.0–100.0)
RBC: 4.81 MIL/uL (ref 4.22–5.81)
RDW: 13.4 % (ref 11.5–15.5)
WBC: 11.3 10*3/uL — ABNORMAL HIGH (ref 4.0–10.5)

## 2013-01-09 LAB — BASIC METABOLIC PANEL
CO2: 26 mEq/L (ref 19–32)
Chloride: 97 mEq/L (ref 96–112)
GFR calc Af Amer: >90 mL/min (ref >90)
Potassium: 4.2 mEq/L (ref 3.5–5.1)
Sodium: 132 mEq/L — ABNORMAL LOW (ref 135–145)

## 2013-01-09 LAB — MAGNESIUM: Magnesium: 1.8 mg/dL (ref 1.5–2.5)

## 2013-01-09 MED ORDER — LORATADINE 10 MG PO TABS
10.0000 mg | ORAL_TABLET | Freq: Every day | ORAL | Status: DC
  Administered 2013-01-09 – 2013-01-14 (×6): 10 mg via ORAL
  Filled 2013-01-09 (×6): qty 1

## 2013-01-09 MED ORDER — SALINE SPRAY 0.65 % NA SOLN
1.0000 | NASAL | Status: DC | PRN
  Administered 2013-01-09: 1 via NASAL
  Filled 2013-01-09: qty 44

## 2013-01-09 NOTE — Progress Notes (Signed)
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  Pt with complaint of dizziness and assisted to sitting position.  HR slowly decreased to high 130s.  BP 131/95.  RN present.  Pt moving well, but limited today due to tachycardia.    Follow Up Recommendations  No OT follow up;Supervision/Assistance - 24 hour    Barriers to Discharge       Equipment Recommendations  3 in 1 bedside comode    Recommendations for Other Services    Frequency Min 2X/week   Progress towards OT Goals Progress towards OT goals: Not progressing toward goals - comment (increased HR)  Plan Discharge plan remains appropriate    Precautions / Restrictions     Pertinent Vitals/Pain     ADL  Lower Body Dressing: Maximal assistance Where Assessed - Lower Body Dressing: Supported sit to stand Toilet Transfer: Hydrographic surveyor Method: Sit to stand;Stand Wellsite geologist: Bedside commode Transfers/Ambulation Related to ADLs: min guard ADL Comments: Upon standing HR increased.  Pt ambulated to door with HR continuing to increase to 158.  Pt with complaint of dizziness.  RN present and pt moved to sitting position.  HR slowly decreased to high 130s and BP 131/95.  Activity discontinued at that time due to sustained tachycardia    OT Diagnosis:    OT Problem List:   OT Treatment Interventions:     OT Goals(current goals can now be found in the care plan section) ADL Goals Pt Will Perform Grooming: with modified independence;standing Pt Will Perform Lower Body Bathing: with modified independence;with adaptive equipment;sit to/from stand Pt Will Perform  Upper Body Dressing: with modified independence;sitting Pt Will Perform Lower Body Dressing: with modified independence;with adaptive equipment;sit to/from stand Pt Will Transfer to Toilet: with modified independence;ambulating;regular height toilet;bedside commode Pt Will Perform Toileting - Clothing Manipulation and hygiene: with modified independence;sit to/from stand Pt Will Perform Tub/Shower Transfer: Tub transfer;with min guard assist;rolling walker;ambulating  Visit Information  Last OT Received On: 01/09/13 Assistance Needed: +1 History of Present Illness: 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  Transfers Transfers: Sit to Stand;Stand to Sit Sit to Stand: 4: Min guard;With upper extremity assist;From bed Stand to Sit: 4: Min guard;With upper extremity assist;To chair/3-in-1    Exercises      Balance     End of Session OT - End of Session Activity Tolerance: Patient limited by pain Patient left: in chair;with call bell/phone within reach Nurse Communication: Other (comment) (increased HR)  GO     Tylek Boney M 01/09/2013, 2:59 PM

## 2013-01-10 NOTE — Progress Notes (Signed)
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  Discussed at length logrolling for bed mobility for pain management and using a folded blanket to apply pressure to abdomen when coughing/sneezing to assist with pain management as well. PT to con't to follow.   Follow Up Recommendations  No PT follow up     Does the patient have the potential to tolerate intense rehabilitation     Barriers to Discharge        Equipment Recommendations  Rolling walker with 5" wheels    Recommendations for Other Services    Frequency Min 4X/week   Progress towards PT Goals Progress towards PT goals: Progressing toward goals  Plan Current plan remains appropriate    Precautions / Restrictions Precautions Precautions: None Restrictions Weight Bearing Restrictions: No   Pertinent Vitals/Pain 7/10 abdominal surgical pain with mobility    Mobility  Bed Mobility Bed Mobility: Rolling Right;Right Sidelying to Sit;Sit to Sidelying Right Rolling Right: 4: Min assist Right Sidelying to Sit: 4: Min assist Sitting - Scoot to Edge of Bed: 5: Supervision Sit to Sidelying Right: 4: Min assist;HOB flat;With rail Details for Bed Mobility Assistance: verbal directional cues Transfers Transfers: Sit to Stand;Stand to Sit Sit to Stand: 4: Min guard;With upper extremity assist;From bed Stand to Sit: 4: Min guard;With upper extremity assist;To chair/3-in-1 Details for Transfer Assistance: steadying assist; vc for technique to sit as pt with incr pain Ambulation/Gait Ambulation/Gait  Assistance: 4: Min guard Ambulation Distance (Feet): 100 Feet Assistive device: Rolling walker Ambulation/Gait Assistance Details: slow, guarded/cautious. 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

## 2013-01-10 NOTE — Progress Notes (Signed)
   Daily Progress Note  Assessment/Planning: POD #3 s/p ABF   Ok to tsfr to floor  D/C cordis and MIVF  OOB & IS for pulm  Amb with assistance  Wean PCA tomorrow  Advance diet   Subjective  - 3 Days Post-Op  Pain better, tolerated clears  Objective Filed Vitals:   01/10/13 0400 01/10/13 0500 01/10/13 0600 01/10/13 0700  BP: 131/79 140/67 126/85   Pulse: 93 84 102 102  Temp: 98.6 F (37 C)     TempSrc: Oral     Resp: 17 15 14 12   Height:      Weight:    164 lb 3.9 oz (74.5 kg)  SpO2: 99% 99% 99% 99%    Intake/Output Summary (Last 24 hours) at 01/10/13 0742 Last data filed at 01/10/13 0700  Gross per 24 hour  Intake 2560.9 ml  Output   1300 ml  Net 1260.9 ml    PULM  CTAB CV  RRR GI  soft, ND, appropriate TTP, inc c/d/i VASC  B groin inc c/d/i, palpable DP B  Laboratory CBC    Component Value Date/Time   WBC 11.3* 01/09/2013 0445   HGB 15.1 01/09/2013 0445   HCT 43.8 01/09/2013 0445   PLT 93* 01/09/2013 0445    BMET    Component Value Date/Time   NA 132* 01/09/2013 0445   K 4.2 01/09/2013 0445   CL 97 01/09/2013 0445   CO2 26 01/09/2013 0445   GLUCOSE 98 01/09/2013 0445   BUN 11 01/09/2013 0445   CREATININE 0.83 01/09/2013 0445   CALCIUM 8.3* 01/09/2013 0445   GFRNONAA >90 01/09/2013 0445   GFRAA >90 01/09/2013 0445    Leonides Sake, MD [facility] Office: [phone] Pager: [phone]  01/10/2013, 7:42 AM

## 2013-01-11 LAB — CBC
HCT: 38.9 % — ABNORMAL LOW (ref 39.0–52.0)
Hemoglobin: 13.4 g/dL (ref 13.0–17.0)
MCH: 31.1 pg (ref 26.0–34.0)
MCHC: 34.4 g/dL (ref 30.0–36.0)
MCV: 90.3 fL (ref 78.0–100.0)
RBC: 4.31 MIL/uL (ref 4.22–5.81)
WBC: 9.8 10*3/uL (ref 4.0–10.5)

## 2013-01-11 LAB — BASIC METABOLIC PANEL
BUN: 15 mg/dL (ref 6–23)
Calcium: 8 mg/dL — ABNORMAL LOW (ref 8.4–10.5)
Chloride: 98 mEq/L (ref 96–112)
Creatinine, Ser: 0.76 mg/dL (ref 0.50–1.35)
GFR calc Af Amer: >90 mL/min (ref >90)
GFR calc non Af Amer: >90 mL/min (ref >90)
Glucose, Bld: 109 mg/dL — ABNORMAL HIGH (ref 70–99)
Potassium: 3.8 mEq/L (ref 3.5–5.1)
Sodium: 135 mEq/L (ref 135–145)

## 2013-01-11 MED ORDER — OXYCODONE-ACETAMINOPHEN 5-325 MG PO TABS
1.0000 | ORAL_TABLET | ORAL | Status: DC | PRN
  Administered 2013-01-11 – 2013-01-14 (×17): 2 via ORAL
  Filled 2013-01-11 (×17): qty 2

## 2013-01-11 MED ORDER — HYDROMORPHONE HCL PF 1 MG/ML IJ SOLN
1.0000 mg | INTRAMUSCULAR | Status: DC | PRN
  Administered 2013-01-11 – 2013-01-12 (×4): 1 mg via INTRAVENOUS
  Filled 2013-01-11 (×4): qty 1

## 2013-01-11 NOTE — Progress Notes (Addendum)
Patient ID: Tommy Malone, male   DOB: 15-Dec-1965, 47 y.o.   MRN: [id number]   Daily Progress Note  Assessment/Planning: POD #4 s/p ABF   Continue ambulation, regular diet, analgesic measures.  Pain is 3/10 now, left arm IV access has infiltrated, po analgesics prescribed in addition to IV dilaudid for breakthrough pain.  PCA was scheduled to be discontinued today.   Subjective  - 4 Days Post-Op  First day solid food, is passing gas, no BM yet. Is ambulating daily, wearing pneumatic compression device on legs at night. He denies any claudication pain in his legs with walking, He states that he has stopped smoking.  Objective Filed Vitals:   01/10/13 1959 01/11/13 0102 01/11/13 0424 01/11/13 0447  BP: 145/90   120/78  Pulse: 98   58  Temp: 98.7 F (37.1 C)   98 F (36.7 C)  TempSrc: Oral   Oral  Resp: 20 13 14 16   Height:      Weight:      SpO2: 98% 95% 95% 100%    Intake/Output Summary (Last 24 hours) at 01/11/13 0811 Last data filed at 01/11/13 0424  Gross per 24 hour  Intake    440 ml  Output    850 ml  Net   -410 ml   PULM  CTAB CV  RRR GI  soft, mild tenderness to palpation at and adjacent to incision, + bowel sounds. VASC  Mid abdominal incision with staples, incision edges well proximated, no drainage, no redness, no swelling. Palpable left PT and DP pulses, palpable right PT pulse. Bilateral femoral pulses palpable. Bilateral femoral incisions with dermabond, incision edges well proximated, no swelling or redness, no drainage.  Laboratory CBC    Component Value Date/Time   WBC 9.8 01/11/2013 0430   HGB 13.4 01/11/2013 0430   HCT 38.9* 01/11/2013 0430   PLT 141* 01/11/2013 0430    BMET    Component Value Date/Time   NA 135 01/11/2013 0430   K 3.8 01/11/2013 0430   CL 98 01/11/2013 0430   CO2 27 01/11/2013 0430   GLUCOSE 109* 01/11/2013 0430   BUN 15 01/11/2013 0430   CREATININE 0.76 01/11/2013 0430   CALCIUM 8.0* 01/11/2013 0430   GFRNONAA >90  01/11/2013 0430   GFRAA >90 01/11/2013 0430    Rosalita Chessman Nickel, RN, MSN, FNP-C [facility] Office: [phone]   01/11/2013, 8:11 AM  Addendum  I have independently interviewed and examined the patient, and I agree with the physician assistant's findings.    Leonides Sake, MD [facility] [facility] Office: [phone] Pager: [phone]  01/11/13 [phone]

## 2013-01-12 NOTE — Progress Notes (Signed)
Occupational Therapy Treatment Patient Details Name: Tommy Malone MRN: [id number] DOB: 12-Feb-1965 Today's Date: 01/12/2013 Time: 4098-1191 OT Time Calculation (min): 15 min  OT Assessment / Plan / Recommendation  History of present illness Adm with bilateral LE claudication. Underwent aortobiffemoral bypass 12/10   OT comments  Pt showing nice gains in functional mobiilty and ADL's this am. Pt is overall Mod I w/ SPC for transfers and functional mobility. Assess LB dressing next visit.  Follow Up Recommendations  No OT follow up;Supervision/Assistance - 24 hour;Supervision - Intermittent    Barriers to Discharge       Equipment Recommendations  3 in 1 bedside comode    Recommendations for Other Services    Frequency Min 2X/week   Progress towards OT Goals Progress towards OT goals: Progressing toward goals  Plan Discharge plan remains appropriate    Precautions / Restrictions Precautions Precautions: None Restrictions Weight Bearing Restrictions: No   Pertinent Vitals/Pain None reported    ADL  Grooming: Simulated;Modified independent (Pt stood at sink after toilet transfer today, "I already washed up") Where Assessed - Grooming: Supported standing;Unsupported standing Upper Body Bathing: Simulated;Set up Where Assessed - Upper Body Bathing: Unsupported sitting;Supported standing Toilet Transfer: Performed;Modified independent (Ambulated to bathroom w/ SPC & 3:1 over toilet) Toilet Transfer Method: Sit to stand Toilet Transfer Equipment: Raised toilet seat with arms (or 3-in-1 over toilet) Toileting - Clothing Manipulation and Hygiene: Simulated;Modified independent Where Assessed - Toileting Clothing Manipulation and Hygiene: Standing Transfers/Ambulation Related to ADLs: Pt is currently Mod I functional mobility and transfers using SPC.  ADL Comments: Pt seen for ADL retraining session today, pt overall Mod I functional mobility and transfers using SPC in room. Bed  mobility mod I as well. Discussed d/c planning w/ pt & his mother, they do not report any further acute OT concerns at this time, however he would benefit from LB dressing next visit to determine need for a/e or not. Pt/mother did voice concerns re:pt being OOW and possible need for social work consult to discuss possible options for bill pay, child support, disability etc... as pt is OOW. Discussed this pt's RN as well.    OT Diagnosis:    OT Problem List:   OT Treatment Interventions:     OT Goals(current goals can now be found in the care plan section) Acute Rehab OT Goals Patient Stated Goal: ultimately return to work OT Goal Formulation: With patient Time For Goal Achievement: 01/15/13 Potential to Achieve Goals: Good  Visit Information  Last OT Received On: 01/12/13 Assistance Needed: +1 History of Present Illness: Adm with bilateral LE claudication. Underwent aortobiffemoral bypass 12/10    Subjective Data      Prior Functioning       Cognition  Cognition Arousal/Alertness: Awake/alert Behavior During Therapy: WFL for tasks assessed/performed Overall Cognitive Status: Within Functional Limits for tasks assessed    Mobility  Bed Mobility Bed Mobility: Supine to Sit;Sitting - Scoot to [place] of Bed;Sit to Supine;Scooting to [facility] Rolling Right: 6: Modified independent (Device/Increase time);With rail Right Sidelying to Sit: 6: Modified independent (Device/Increase time);With rails;HOB elevated Supine to Sit: HOB elevated;7: Independent Sitting - Scoot to Edge of Bed: 7: Independent Sit to Supine: 7: Independent;HOB elevated Scooting to HOB: 6: Modified independent (Device/Increase time) Details for Bed Mobility Assistance: Pt Mod I overall for bed mobility today.  Transfers Transfers: Sit to Stand;Stand to Sit Sit to Stand: 6: Modified independent (Device/Increase time);With upper extremity assist;From bed;With armrests;From chair/3-in-1 Stand to Sit: 6: Modified  independent (Device/Increase time);With upper extremity assist;With armrests;To chair/3-in-1         Balance Balance Balance Assessed: No Static Standing Balance Static Standing - Balance Support: No upper extremity supported Static Standing - Level of Assistance: 6: Modified independent (Device/Increase time)   End of Session OT - End of Session Equipment Utilized During Treatment: Other (comment) (SPC, 3:1 over toilet in bathroom) Activity Tolerance: Patient tolerated treatment well Patient left: in bed;with call bell/phone within reach;with family/visitor present Nurse Communication: Mobility status;Other (comment) (Pt/family request Social Work Consult)  GO     Tommy Malone [employer] 01/12/2013, 11:47 AM

## 2013-01-12 NOTE — Progress Notes (Signed)
Physical Therapy Treatment Patient Details Name: Tommy Malone MRN: [id number] DOB: [birth year] Today's Date: 01/12/2013 Time: 3086-5784 PT Time Calculation (min): 14 min  PT Assessment / Plan / Recommendation  History of Present Illness Adm with bilateral LE claudication. Underwent aortobiffemoral bypass 12/10   PT Comments   Pt doing very well with mobility.  Progressing pt to a cane.    Follow Up Recommendations  No PT follow up     Does the patient have the potential to tolerate intense rehabilitation     Barriers to Discharge        Equipment Recommendations  Cane    Recommendations for Other Services    Frequency Min 4X/week   Progress towards PT Goals Progress towards PT goals: Goals met and updated - see care plan;Progressing toward goals  Plan Discharge plan needs to be updated    Precautions / Restrictions Precautions Precautions: None   Pertinent Vitals/Pain Incisional soreness.    Mobility  Bed Mobility Rolling Right: 6: Modified independent (Device/Increase time);With rail Right Sidelying to Sit: 6: Modified independent (Device/Increase time);With rails;HOB elevated Transfers Sit to Stand: 6: Modified independent (Device/Increase time);With upper extremity assist;From bed;With armrests;From chair/3-in-1 Stand to Sit: 6: Modified independent (Device/Increase time);With upper extremity assist;With armrests;To chair/3-in-1 Ambulation/Gait Ambulation/Gait Assistance: 6: Modified independent (Device/Increase time);5: Supervision Ambulation Distance (Feet): 250 Feet Assistive device: Rolling walker;Straight cane Ambulation/Gait Assistance Details: Mod I with rolling walker and supervision with cane.  HR only to 112 with amb. Gait Pattern: Step-through pattern;Decreased stride length Gait velocity: decr    Exercises     PT Diagnosis:    PT Problem List:   PT Treatment Interventions:     PT Goals (current goals can now be found in the care plan section)     Visit Information  Last PT Received On: 01/12/13 Assistance Needed: +1 History of Present Illness: Adm with bilateral LE claudication. Underwent aortobiffemoral bypass 12/10    Subjective Data      Cognition  Cognition Arousal/Alertness: Awake/alert Behavior During Therapy: WFL for tasks assessed/performed Overall Cognitive Status: Within Functional Limits for tasks assessed    Balance  Static Standing Balance Static Standing - Balance Support: No upper extremity supported Static Standing - Level of Assistance: 6: Modified independent (Device/Increase time)  End of Session PT - End of Session Activity Tolerance: Patient tolerated treatment well Patient left: in bed Nurse Communication: Mobility status   GP     Tommy Malone 01/12/2013, 11:10 AM  Tommy Malone PT [phone]

## 2013-01-12 NOTE — Progress Notes (Addendum)
[facility] [place]  Subjective  - Doing well eating small amounts.  Still painful at incision sites and taking PO pain meds every 4 hours.   Objective 110/78 113 98.7 F (37.1 C) (Oral) 20 92%  Intake/Output Summary (Last 24 hours) at 01/12/13 0754 Last data filed at 01/12/13 0313  Gross per 24 hour  Intake    360 ml  Output    925 ml  Net   -565 ml    Palpable DP/PT Feet well perfused and warm. Incisions healing well without erythema or hematoma.  Assessment/Planning: POD #5 S/P aorto by-fem by-pass PCA D/C's today Increase PO intact as tolerates Possible D/C in the next 2 days   Clinton Gallant [facility] 01/12/2013 7:54 AM --  Laboratory Lab Results:  Recent Labs  01/11/13 0430  WBC 9.8  HGB 13.4  HCT 38.9*  PLT 141*   BMET  Recent Labs  01/11/13 0430  NA 135  K 3.8  CL 98  CO2 27  GLUCOSE 109*  BUN 15  CREATININE 0.76  CALCIUM 8.0*    COAG Lab Results  Component Value Date   INR 0.98 01/07/2013   INR 0.86 01/06/2013   No results found for this basename: PTT   Addendum  I have independently interviewed and examined the patient, and I agree with the physician assistant's findings.  Home over the next few days once pain improved and ambulation better.  Leonides Sake, MD [facility] [place] Office: [phone] Pager: [phone]  01/12/2013, 11:35 AM

## 2013-01-13 MED ORDER — BISACODYL 5 MG PO TBEC
5.0000 mg | DELAYED_RELEASE_TABLET | Freq: Every day | ORAL | Status: DC | PRN
  Administered 2013-01-13: 5 mg via ORAL
  Filled 2013-01-13: qty 1

## 2013-01-13 NOTE — Progress Notes (Signed)
Physical Therapy Treatment Patient Details Name: Tommy Malone MRN: [id number] DOB: May 06, 1965 Today's Date: 01/13/2013 Time: 7829-5621 PT Time Calculation (min): 10 min  PT Assessment / Plan / Recommendation  History of Present Illness Adm with bilateral LE claudication. Underwent aortobiffemoral bypass 12/10   PT Comments   Patient continues with slight flexion due to incisional pain, however feel he can mobilize independently and safely with no further need of acute PT services.  Patient will plan to walk in hallway at least 2 x /day until d/c.  Left cane in room for pt use.  Will need a cane prior to d/c home.  Follow Up Recommendations  No PT follow up     Does the patient have the potential to tolerate intense rehabilitation     Barriers to Discharge        Equipment Recommendations  Cane    Recommendations for Other Services    Frequency Other (Comment) (d/c PT today due to goals met)   Progress towards PT Goals Progress towards PT goals: Goals met/education completed, patient discharged from PT  Plan Current plan remains appropriate    Precautions / Restrictions Precautions Precautions: None   Pertinent Vitals/Pain Min c/o incisional pain with ambulation and bed mobility    Mobility  Bed Mobility Right Sidelying to Sit: 6: Modified independent (Device/Increase time);With rails;HOB flat Details for Bed Mobility Assistance: slow and antalgic Transfers Sit to Stand: 6: Modified independent (Device/Increase time);From bed;With upper extremity assist Stand to Sit: 6: Modified independent (Device/Increase time);To bed;With upper extremity assist Ambulation/Gait Assistive device: Straight cane Ambulation/Gait Assistance Details: demonstrated inconsistent sequence, but when pt cued to move cane with particular foot, he stated he has been using the cane prior to hospitalization (was grandfathers) Gait Pattern: Decreased stride length;Step-through pattern;Trunk  flexed General Gait Details: increased trunk flex due to pain Stairs: Yes Stairs Assistance: 6: Modified independent (Device/Increase time) Stair Management Technique: One rail Right;With cane Number of Stairs: 4        PT Diagnosis:    PT Problem List:   PT Treatment Interventions:     PT Goals (current goals can now be found in the care plan section)    Visit Information  Last PT Received On: 01/13/13 Assistance Needed: +1 History of Present Illness: Adm with bilateral LE claudication. Underwent aortobiffemoral bypass 12/10    Subjective Data   I went earlier but used walker.  I need a cane.   Cognition  Cognition Arousal/Alertness: Awake/alert Behavior During Therapy: WFL for tasks assessed/performed Overall Cognitive Status: Within Functional Limits for tasks assessed    Balance  Static Standing Balance Static Standing - Balance Support: No upper extremity supported Static Standing - Level of Assistance: 6: Modified independent (Device/Increase time)  End of Session PT - End of Session Activity Tolerance: Patient tolerated treatment well Patient left: in bed   GP     [facility] 01/13/2013, 2:15 PM Sheran Lawless, PT [phone] 01/13/2013 Sheran Lawless, PT [phone] 01/13/2013

## 2013-01-13 NOTE — Progress Notes (Signed)
VASCULAR LAB PRELIMINARY  PRELIMINARY  PRELIMINARY  PRELIMINARY      Upper Extremity Right Left  Brachial Pressures 130 120  Radial Waveforms    Ulnar Waveforms    Palmar Arch (Allen's Test)     Findings:      Lower  Extremity Right Left  Dorsalis Pedis 126 120  Anterior Tibial 140 138  Posterior Tibial    Ankle/Brachial Indices 1.08 1.06    Findings:   Normal ABIs and pedal waveforms at rest post op.  Bristol Soy, RVT 01/13/2013, 9:15 AM

## 2013-01-13 NOTE — Progress Notes (Signed)
   Daily Progress Note  Assessment/Planning: POD #6 s/p ABF   Home in next 1-2 days  ABI  Subjective  - 6 Days Post-Op  Feeling constipated  Objective Filed Vitals:   01/12/13 0916 01/12/13 1345 01/12/13 2102 01/13/13 0415  BP: 111/72 126/75 117/79 105/65  Pulse: 70 84 82 80  Temp:  98.3 F (36.8 C) 98.3 F (36.8 C) 98.2 F (36.8 C)  TempSrc:  Oral Oral Oral  Resp:  20 20 20   Height:      Weight:      SpO2:  95% 94% 92%    Intake/Output Summary (Last 24 hours) at 01/13/13 0759 Last data filed at 01/12/13 1700  Gross per 24 hour  Intake    720 ml  Output      2 ml  Net    718 ml    PULM  CTAB CV  RRR GI  soft, approp TTP, inc c/d/i VASC  B groin inc c/d/i, warm feet with palpable DP  Laboratory CBC    Component Value Date/Time   WBC 9.8 01/11/2013 0430   HGB 13.4 01/11/2013 0430   HCT 38.9* 01/11/2013 0430   PLT 141* 01/11/2013 0430    BMET    Component Value Date/Time   NA 135 01/11/2013 0430   K 3.8 01/11/2013 0430   CL 98 01/11/2013 0430   CO2 27 01/11/2013 0430   GLUCOSE 109* 01/11/2013 0430   BUN 15 01/11/2013 0430   CREATININE 0.76 01/11/2013 0430   CALCIUM 8.0* 01/11/2013 0430   GFRNONAA >90 01/11/2013 0430   GFRAA >90 01/11/2013 0430    Leonides Sake, MD [facility] [place] Office: [phone] Pager: [phone]  01/13/2013, 7:59 AM

## 2013-01-14 MED ORDER — OXYCODONE HCL 5 MG PO TABS: 5.0000 mg | ORAL_TABLET | Freq: Four times a day (QID) | ORAL | Status: DC | PRN

## 2013-01-14 MED ORDER — ATORVASTATIN CALCIUM 10 MG PO TABS: 10.0000 mg | ORAL_TABLET | Freq: Every day | ORAL | Status: DC

## 2013-01-14 NOTE — Progress Notes (Signed)
Per RN, patient wanted to speak to social worker, but would not explain why. CSW went into room and introduced self. Patient explained that he wanted assistance in short term disability. CSW explained that Social Services in [place] would be able to help patient with that and provided contact information to [employer] at [facility]. Patient thanked CSW for visit. CSW signing off at this time as there are no longer social work needs.  Maree Krabbe, MSW, Theresia Majors [phone]

## 2013-01-14 NOTE — Discharge Summary (Signed)
[facility] Discharge Summary  Tommy Malone 12-25-1965 47 y.o. male  [id number]  Admission Date: 01/07/2013  Discharge Date: 01/14/13  Physician: Fransisco Hertz, MD  Admission Diagnosis: Peripheral Vascular Disease with Claudication   HPI:   This is a 47 y.o. male who presents with chief complaint: short distance claudication. The patient's symptoms have progressed. The patient's symptoms are: very short distance claudication. His recently diagnostic aortogram with bilateral runoff demonstrated essentially distal aortic occlusion with collateral pelvic filling via distal lumbars. The patient's treatment regimen currently included: maximal medical management. The patient is scheduled to see a Cardiologist today for preoperative risk stratification and optimization for a possible aortobifemoral graft.  Hospital Course:  The patient was admitted to the hospital and taken to the operating room on 01/07/2013 and underwent: Aortobifemoral bypass grafting.   The pt tolerated the procedure well and was transported to the PACU in good condition. By POD 3, his diet was advanced and his PCA was weaned.  Post operative ABI's are as follows from 01/13/13: Lower Extremity  Right  Left   Dorsalis Pedis  126  120   Anterior Tibial  140  138   Posterior Tibial     Ankle/Brachial Indices  1.08  1.06    The remainder of the hospital course consisted of increasing mobilization and increasing intake of solids without difficulty.  CBC    Component Value Date/Time   WBC 9.8 01/11/2013 0430   RBC 4.31 01/11/2013 0430   HGB 13.4 01/11/2013 0430   HCT 38.9* 01/11/2013 0430   PLT 141* 01/11/2013 0430   MCV 90.3 01/11/2013 0430   MCH 31.1 01/11/2013 0430   MCHC 34.4 01/11/2013 0430   RDW 12.8 01/11/2013 0430    BMET    Component Value Date/Time   NA 135 01/11/2013 0430   K 3.8 01/11/2013 0430   CL 98 01/11/2013 0430   CO2 27 01/11/2013 0430   GLUCOSE 109* 01/11/2013  0430   BUN 15 01/11/2013 0430   CREATININE 0.76 01/11/2013 0430   CALCIUM 8.0* 01/11/2013 0430   GFRNONAA >90 01/11/2013 0430   GFRAA >90 01/11/2013 0430     Discharge Instructions:   The patient is discharged to home with extensive instructions on wound care and progressive ambulation.  They are instructed not to drive or perform any heavy lifting until returning to see the physician in his office.  Discharge Orders   Future Appointments Provider Department Dept Phone   01/27/2013 1:00 PM [place] Dennie [facility] [address] [facility] SAME DAY SURGERY [phone]   Future Orders Complete By Expires   ABDOMINAL PROCEDURE/ANEURYSM REPAIR/AORTO-BIFEMORAL BYPASS:  Call MD for increased abdominal pain; cramping diarrhea; nausea/vomiting  As directed    Call MD for:  redness, tenderness, or signs of infection (pain, swelling, bleeding, redness, odor or green/yellow discharge around incision site)  As directed    Call MD for:  severe or increased pain, loss or decreased feeling  in affected limb(s)  As directed    Call MD for:  temperature >100.5  As directed    Discharge wound care:  As directed    Comments:     Wash the groin wound with soap and water daily and pat dry. (No tub bath-only shower)  Then put a dry gauze or washcloth there to keep this area dry daily and as needed.  Do not use Vaseline or neosporin on your incisions.  Only use soap and water on your incisions and then  protect and keep dry.   Driving Restrictions  As directed    Comments:     No driving for 2 weeks   Lifting restrictions  As directed    Comments:     No lifting for 4 weeks   Resume previous diet  As directed       Discharge Diagnosis:  Peripheral Vascular Disease with Claudication  Secondary Diagnosis: Patient Active Problem List   Diagnosis Date Noted  . Chronic distal aortic occlusion 01/07/2013  . Pain in limb-Bilateral hip-thigh-leg 12/05/2012  . Atherosclerosis of native arteries of the extremities  with intermittent claudication 12/05/2012  . Nicotine addiction 04/30/2011   Past Medical History  Diagnosis Date  . Acid reflux   . Depression   . Anxiety   . Peripheral vascular disease   . Hemorrhoid        Medication List         aspirin 81 MG tablet  Take 81 mg by mouth daily.     atorvastatin 10 MG tablet  Commonly known as:  LIPITOR  Take 1 tablet (10 mg total) by mouth daily.     CIALIS 20 MG tablet  Generic drug:  tadalafil  Take 20 mg by mouth daily as needed for erectile dysfunction.     clonazePAM 0.5 MG tablet  Commonly known as:  KLONOPIN  Take 0.5 mg by mouth 2 (two) times daily as needed for anxiety.     ibuprofen 200 MG tablet  Commonly known as:  ADVIL,MOTRIN  Take 600 mg by mouth every 6 (six) hours as needed for headache or moderate pain.     omeprazole 20 MG capsule  Commonly known as:  PRILOSEC  Take 20 mg by mouth daily.     oxyCODONE 5 MG immediate release tablet  Commonly known as:  ROXICODONE  Take 1 tablet (5 mg total) by mouth every 6 (six) hours as needed for severe pain.     sertraline 100 MG tablet  Commonly known as:  ZOLOFT  Take 100 mg by mouth daily.        Roxicodone #30 No Refill  Disposition: home  Patient's condition: is Good  Follow up: 1. Dr. Imogene Burn in 2 weeks   Doreatha Massed, PA-C [facility] [phone] 01/14/2013  4:19 PM  Addendum  I have independently interviewed and examined the patient, and I agree with the physician assistant's discharge summary.  This patient had an uneventful aortobifemoral bypass with uneventful recovery except some difficulty with pain control requiring extended hospitalization.  The patient will follow up in the office in 2 weeks for staple removal.  Leonides Sake, MD [facility] [place] Office: [phone] Pager: [phone]  01/14/2013, 4:35 PM    - For VQI Registry use ---   Post-op:  Time to Extubation: [ x] In OR, [ ]  <  12 hrs, [ ]  12-24 hrs, [ ]  >=24 hrs Vasopressors Req. Post-op: No ICU Stay: 3 days (should have gone to floor on POD 2, but no beds available) Transfusion: No  If yes, n/a units given MI: No, [ ]  Troponin only, [ ]  EKG or Clinical New Arrhythmia: No  Complications: CHF: No Resp failure: No, [ ]  Pneumonia, [ ]  Ventilator Chg in renal function: No, [ ]  Inc. Cr > 0.5, [ ]  Temp. Dialysis, [ ]  Permanent dialysis Leg ischemia: No, no Surgery needed, [ ]  Yes, Surgery needed, [ ]  Amputation Bowel ischemia: No, [ ]  Medical Rx, [ ]  Surgical  Rx Wound complication: No, [ ]  Superficial separation/infection, [ ]  Return to OR Return to OR: No  Return to OR for bleeding: No Stroke: No, [ ]  Minor, [ ]  Major  Discharge medications: Statin use:  Yes If No: [ ]  For Medical reasons, [ ]  Non-compliant ASA use:  Yes  If No: [ ]  For Medical reasons, [ ]  Non-compliant Plavix use:  No If No: [ ]  For Medical reasons, [ ]  Non-compliant Beta blocker use:  No If No: [ ]  For Medical reasons, [ ]  Non-compliant

## 2013-01-14 NOTE — Progress Notes (Signed)
Pt encouraged to get up OOB and ambulate today; pt has yet to get OOB to ambulate; pt reminded of reason for keeping pt today was for continued ambulation; will cont. To monitor.

## 2013-01-14 NOTE — Progress Notes (Addendum)
[facility] Progress Note  01/14/2013 8:24 AM POD 7  Subjective:  Feeling some better-had a BM yesterday; denies N/V  Afebrile VSS  Filed Vitals:   01/14/13 0530  BP: 115/73  Pulse: 89  Temp: 98 F (36.7 C)  Resp: 20    Physical Exam: Cardiac:  regular Lungs:  CTAB Abdomen:  Soft; NT/ND +flatus; +BM yesterday Incisions:  Bilateral groin incisions are c/d/i and healing nicely.  Abdominal incision in tact with staples in place Extremities:  + palpable DP pulses bilaterally;  Bilateral feet are warm without edema.  CBC    Component Value Date/Time   WBC 9.8 01/11/2013 0430   RBC 4.31 01/11/2013 0430   HGB 13.4 01/11/2013 0430   HCT 38.9* 01/11/2013 0430   PLT 141* 01/11/2013 0430   MCV 90.3 01/11/2013 0430   MCH 31.1 01/11/2013 0430   MCHC 34.4 01/11/2013 0430   RDW 12.8 01/11/2013 0430    BMET    Component Value Date/Time   NA 135 01/11/2013 0430   K 3.8 01/11/2013 0430   CL 98 01/11/2013 0430   CO2 27 01/11/2013 0430   GLUCOSE 109* 01/11/2013 0430   BUN 15 01/11/2013 0430   CREATININE 0.76 01/11/2013 0430   CALCIUM 8.0* 01/11/2013 0430   GFRNONAA >90 01/11/2013 0430   GFRAA >90 01/11/2013 0430    INR    Component Value Date/Time   INR 0.98 01/07/2013 1316     Intake/Output Summary (Last 24 hours) at 01/14/13 1610 Last data filed at 01/13/13 1200  Gross per 24 hour  Intake    240 ml  Output      0 ml  Net    240 ml   ABI's 01/13/13: Upper Extremity  Right  Left   Brachial Pressures  130  120   Radial Waveforms     Ulnar Waveforms     Palmar Arch (Allen's Test)     Findings:  Lower Extremity  Right  Left   Dorsalis Pedis  126  120   Anterior Tibial  140  138   Posterior Tibial     Ankle/Brachial Indices  1.08  1.06      Assessment/Plan:  47 y.o. male is s/p AoBifem Bypass graft  POD 7  -pt doing well -tolerating diet -mobilize more today and most likely home tomorrow   Doreatha Massed, PA-C [facility] [phone] 01/14/2013 8:24 AM  Addendum  I have independently interviewed and examined the patient, and I agree with the physician assistant's findings.  Home tomorrow.  Leonides Sake, MD [facility] [place] Office: [phone] Pager: [phone]  01/14/2013, 11:47 AM

## 2013-01-14 NOTE — Progress Notes (Signed)
OT Cancellation Note  Patient Details Name: Tommy Malone MRN: [id number] DOB: 1965/11/07   Cancelled Treatment:      Attempted LB dressing tasks w/ pt today, pt reports he is independent & has no further OT needs at this time. Pt has been d/c from PT secondary to independence using SPC, pt reports no needs w/ LB ADL's/dressing & plans to d/c home tomorrow. Will sign off OT as pt reports Mod I level.  Roselie Awkward Dixon 01/14/2013, 12:38 PM

## 2013-01-14 NOTE — Progress Notes (Signed)
Pt given d/c instructions at this time; IV and tele monitor d/c at this time; pt to d/c home with girlfriend; will await her arrival.

## 2013-01-15 ENCOUNTER — Encounter (HOSPITAL_COMMUNITY): Payer: PRIVATE HEALTH INSURANCE

## 2013-01-15 ENCOUNTER — Telehealth: Payer: Self-pay | Admitting: Vascular Surgery

## 2013-01-15 NOTE — Telephone Encounter (Addendum)
Message copied by Fredrich Birks on Thu Jan 15, 2013  9:03 AM ------      Message from: Dara Lords      Created: Wed Jan 14, 2013  3:55 PM       S/p Aortobifemoral bypass 01/07/13.  F/u with Dr. Imogene Burn in 2 weeks.            Thanks,      Lelon Mast ------  01/15/13: lm for pt re appt, dpm

## 2013-01-20 ENCOUNTER — Encounter (INDEPENDENT_AMBULATORY_CARE_PROVIDER_SITE_OTHER): Payer: Self-pay

## 2013-01-20 ENCOUNTER — Encounter: Payer: Self-pay | Admitting: Family

## 2013-01-20 ENCOUNTER — Ambulatory Visit (INDEPENDENT_AMBULATORY_CARE_PROVIDER_SITE_OTHER): Payer: Self-pay | Admitting: Family

## 2013-01-20 VITALS — BP 106/78 | HR 80 | Temp 97.6°F | Resp 16 | Ht 69.0 in | Wt 160.0 lb

## 2013-01-20 DIAGNOSIS — I6529 Occlusion and stenosis of unspecified carotid artery: Secondary | ICD-10-CM

## 2013-01-20 DIAGNOSIS — Z4802 Encounter for removal of sutures: Secondary | ICD-10-CM

## 2013-01-20 DIAGNOSIS — G8918 Other acute postprocedural pain: Secondary | ICD-10-CM

## 2013-01-20 DIAGNOSIS — I70219 Atherosclerosis of native arteries of extremities with intermittent claudication, unspecified extremity: Secondary | ICD-10-CM

## 2013-01-20 MED ORDER — OXYCODONE HCL 5 MG PO TABS: 5.0000 mg | ORAL_TABLET | Freq: Four times a day (QID) | ORAL | Status: DC | PRN

## 2013-01-20 NOTE — Progress Notes (Signed)
[facility] [place] HISTORY AND PHYSICAL -PAD  History of Present Illness Tommy Malone is a 47 y.o. male patient of Dr. Imogene Burn who had an aortobifem BPG on 01/07/13, he was discharged from [facility] 6 days ago. He returns today as he was concerned yesterday about the degree of redness of the incision which has improved a great deal he states. He is also concerned about right groin swelling which has decreased in the last 24 hours, denies pain or drainage. He is walking, walked up to 2 hours yesterday with no claudication symptoms, denies non-healing wounds. 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  States his pain is well controlled on 5 mg oxycodone every 6 hours, states he will run out before seeing Dr. Imogene Burn on 02/06/13, he denies constipation, has had a BM.  Pt Diabetic: No Pt smoker: smoker  (3-4 cigarettes/day since he was discharged from the hospital 6 days ago, up until hospitalized was smoking 1 ppd x 29 yrs)  Pt meds include: Statin :Yes ASA: Yes Other anticoagulants/antiplatelets: no  Past Medical History  Diagnosis Date  . Acid reflux   . Depression   . Anxiety   . Peripheral vascular disease   . Hemorrhoid     Social History History  Substance Use Topics  . Smoking status: Current Every Day Smoker -- 1.00 packs/day for 30 years    Types: Cigarettes  . Smokeless tobacco: Never Used  . Alcohol Use: No     Comment: occasional    Family History Family History  Problem Relation Age of Onset  . Heart disease Mother   . Heart attack Mother   . Hyperlipidemia Mother   . Heart disease Maternal Grandmother   . Cancer Paternal Grandfather   . Heart disease Father   . Heart attack Father   . Hyperlipidemia Father     Past Surgical History  Procedure Laterality Date  . Elbow surgery Bilateral   . Kidney stone surgery      x2  .  Aorta - bilateral femoral artery bypass graft N/A 01/07/2013    Procedure: AORTA BIFEMORAL BYPASS GRAFT;  Surgeon: Fransisco Hertz, MD;  Location: [facility] OR;  Service: Vascular;  Laterality: N/A;    Allergies  Allergen Reactions  . Simvastatin Nausea Only    Stomach aches    Current Outpatient Prescriptions  Medication Sig Dispense Refill  . aspirin 81 MG tablet Take 81 mg by mouth daily.      [name] atorvastatin (LIPITOR) 10 MG tablet Take 1 tablet (10 mg total) by mouth daily.  90 tablet  3  . CIALIS 20 MG tablet Take 20 mg by mouth daily as needed for erectile dysfunction.       . clonazePAM (KLONOPIN) 0.5 MG tablet Take 0.5 mg by mouth 2 (two) times daily as needed for anxiety.      [name] ibuprofen (ADVIL,MOTRIN) 200 MG tablet Take 600 mg by mouth every 6 (six) hours as needed for headache or moderate pain.      [name] omeprazole (PRILOSEC) 20 MG capsule Take 20 mg by mouth daily.      [name] oxyCODONE (ROXICODONE) 5 MG immediate release tablet Take 1 tablet (5 mg total) by mouth every 6 (six) hours as needed for severe pain.  30 tablet  0  . sertraline (ZOLOFT) 100 MG tablet Take 100 mg by  mouth daily.       No current facility-administered medications for this visit.    ROS: See HPI for pertinent positives and negatives.   Physical Examination  Filed Vitals:   01/20/13 1448  BP: 106/78  Pulse: 80  Temp: 97.6 F (36.4 C)  Resp: 16   Filed Weights   01/20/13 1448  Weight: 160 lb (72.576 kg)   Body mass index is 23.62 kg/(m^2).   General: A&O x 3, WDWN,  Gait: normal Eyes: PERRLA, Pulmonary: CTAB, without wheezes , rales or rhonchi Cardiac: regular Rythm , without murmur          Carotid Bruits Left Right   Positive Negative  Aorta: is not palpable Radial pulses: : Right is 2+, left is not palpable. Brachial pulses; both are 3+                           VASCULAR EXAM: Extremities without ischemic changes  without Gangrene; without open wounds.                                                                                                           LE Pulses LEFT RIGHT       FEMORAL   palpable   palpable        POPLITEAL  not palpable   not palpable       POSTERIOR TIBIAL   palpable    palpable        DORSALIS PEDIS      ANTERIOR TIBIAL  palpable  palpable    Abdomen: soft, no masses. Expected degree of tenderness on palpation s/p aortobifem BPG thirteen days ago. Abdominal incision is well proximated, staples in place, no swelling, no drainage, and minimal redness. Both groin incisions are well proximated , Dermabond over incisions, no drainage, no bruising, swelling in moderate on the right , mild on the left. Skin: no rashes, no ulcers noted. Musculoskeletal: no muscle wasting or atrophy.  Neurologic: A&O X 3; Appropriate Affect ; SENSATION: normal; MOTOR FUNCTION:  moving all extremities equally, motor strength 5/5 throughout. Speech is fluent/normal. CN 2-12 intact.   ASSESSMENT: Tommy Malone is a 47 y.o. male who presents  aortobifem BPG on 01/07/13 with concerns re swelling in left groin and redness of abdominal incision, both of which have markedly improved by his report in the last 24 hours. Femoral and abdominal incisions are healing well, minimal redness at the abdominal incision, minimal swelling in left groin, moderate swelling in right groin with no redness nor drainage. He has no claudication symptoms with walking for 2 hours yesterday; he did have considerable bilateral claudication before the aortobifem BPG on 01/07/13. He reports that he had 20% left ICA stenosis a few years ago on [place], he has a bruit that is auscultated on the left carotid artery, he has no palpable left radial pulse, but he denies steal symptoms in either arm.   PLAN:  I discussed in depth with the patient the nature of atherosclerosis, and emphasized  the importance of maximal medical management including strict control of blood pressure, blood glucose, and lipid levels, obtaining  regular exercise, and cessation of smoking.  The patient is aware that without maximal medical management the underlying atherosclerotic disease process will progress, limiting the benefit of any interventions.  Based on the patient's vascular studies and examination, pt will return to clinic in on 02/06/13 with Dr. Imogene Burn for post op visit as scheduled. Will add carotid Duplex that day prior to him seeing Dr. Imogene Burn.  The patient was counseled re smoking cessation.  The patient was given information about PAD including signs, symptoms, treatment, what symptoms should prompt the patient to seek immediate medical care, and risk reduction measures to take.  Charisse March, RN, MSN, FNP-C [facility] [place] Phone: [phone]  Clinic MD: Early  01/20/2013 3:13 PM

## 2013-01-20 NOTE — Patient Instructions (Signed)
Smoking Cessation Quitting smoking is important to your health and has many advantages. However, it is not always easy to quit since nicotine is a very addictive drug. Often times, people try 3 times or more before being able to quit. This document explains the best ways for you to prepare to quit smoking. Quitting takes hard work and a lot of effort, but you can do it. ADVANTAGES OF QUITTING SMOKING  You will live longer, feel better, and live better.  Your body will feel the impact of quitting smoking almost immediately.  Within 20 minutes, blood pressure decreases. Your pulse returns to its normal level.  After 8 hours, carbon monoxide levels in the blood return to normal. Your oxygen level increases.  After 24 hours, the chance of having a heart attack starts to decrease. Your breath, hair, and body stop smelling like smoke.  After 48 hours, damaged nerve endings begin to recover. Your sense of taste and smell improve.  After 72 hours, the body is virtually free of nicotine. Your bronchial tubes relax and breathing becomes easier.  After 2 to 12 weeks, lungs can hold more air. Exercise becomes easier and circulation improves.  The risk of having a heart attack, stroke, cancer, or lung disease is greatly reduced.  After 1 year, the risk of coronary heart disease is cut in half.  After 5 years, the risk of stroke falls to the same as a nonsmoker.  After 10 years, the risk of lung cancer is cut in half and the risk of other cancers decreases significantly.  After 15 years, the risk of coronary heart disease drops, usually to the level of a nonsmoker.  If you are pregnant, quitting smoking will improve your chances of having a healthy baby.  The people you live with, especially any children, will be healthier.  You will have extra money to spend on things other than cigarettes. QUESTIONS TO THINK ABOUT BEFORE ATTEMPTING TO QUIT You may want to talk about your answers with your  caregiver.  Why do you want to quit?  If you tried to quit in the past, what helped and what did not?  What will be the most difficult situations for you after you quit? How will you plan to handle them?  Who can help you through the tough times? Your family? Friends? A caregiver?  What pleasures do you get from smoking? What ways can you still get pleasure if you quit? Here are some questions to ask your caregiver:  How can you help me to be successful at quitting?  What medicine do you think would be best for me and how should I take it?  What should I do if I need more help?  What is smoking withdrawal like? How can I get information on withdrawal? GET READY  Set a quit date.  Change your environment by getting rid of all cigarettes, ashtrays, matches, and lighters in your home, car, or work. Do not let people smoke in your home.  Review your past attempts to quit. Think about what worked and what did not. GET SUPPORT AND ENCOURAGEMENT You have a better chance of being successful if you have help. You can get support in many ways.  Tell your family, friends, and co-workers that you are going to quit and need their support. Ask them not to smoke around you.  Get individual, group, or telephone counseling and support. Programs are available at local hospitals and health centers. Call your local health department for   information about programs in your area.  Spiritual beliefs and practices may help some smokers quit.  Download a "quit meter" on your computer to keep track of quit statistics, such as how long you have gone without smoking, cigarettes not smoked, and money saved.  Get a self-help book about quitting smoking and staying off of tobacco. LEARN NEW SKILLS AND BEHAVIORS  Distract yourself from urges to smoke. Talk to someone, go for a walk, or occupy your time with a task.  Change your normal routine. Take a different route to work. Drink tea instead of coffee.  Eat breakfast in a different place.  Reduce your stress. Take a hot bath, exercise, or read a book.  Plan something enjoyable to do every day. Reward yourself for not smoking.  Explore interactive web-based programs that specialize in helping you quit. GET MEDICINE AND USE IT CORRECTLY Medicines can help you stop smoking and decrease the urge to smoke. Combining medicine with the above behavioral methods and support can greatly increase your chances of successfully quitting smoking.  Nicotine replacement therapy helps deliver nicotine to your body without the negative effects and risks of smoking. Nicotine replacement therapy includes nicotine gum, lozenges, inhalers, nasal sprays, and skin patches. Some may be available over-the-counter and others require a prescription.  Antidepressant medicine helps people abstain from smoking, but how this works is unknown. This medicine is available by prescription.  Nicotinic receptor partial agonist medicine simulates the effect of nicotine in your brain. This medicine is available by prescription. Ask your caregiver for advice about which medicines to use and how to use them based on your health history. Your caregiver will tell you what side effects to look out for if you choose to be on a medicine or therapy. Carefully read the information on the package. Do not use any other product containing nicotine while using a nicotine replacement product.  RELAPSE OR DIFFICULT SITUATIONS Most relapses occur within the first 3 months after quitting. Do not be discouraged if you start smoking again. Remember, most people try several times before finally quitting. You may have symptoms of withdrawal because your body is used to nicotine. You may crave cigarettes, be irritable, feel very hungry, cough often, get headaches, or have difficulty concentrating. The withdrawal symptoms are only temporary. They are strongest when you first quit, but they will go away within  10 14 days. To reduce the chances of relapse, try to:  Avoid drinking alcohol. Drinking lowers your chances of successfully quitting.  Reduce the amount of caffeine you consume. Once you quit smoking, the amount of caffeine in your body increases and can give you symptoms, such as a rapid heartbeat, sweating, and anxiety.  Avoid smokers because they can make you want to smoke.  Do not let weight gain distract you. Many smokers will gain weight when they quit, usually less than 10 pounds. Eat a healthy diet and stay active. You can always lose the weight gained after you quit.  Find ways to improve your mood other than smoking. FOR MORE INFORMATION  www.smokefree.gov  Document Released: 01/09/2001 Document Revised: 07/17/2011 Document Reviewed: 04/26/2011 Exit[employer] Patient Information 2014 [employer].   Peripheral Vascular Disease Peripheral Vascular Disease (PVD), also called Peripheral Arterial Disease (PAD), is a circulation problem caused by cholesterol (atherosclerotic plaque) deposits in the arteries. PVD commonly occurs in the lower extremities (legs) but it can occur in other areas of the body, such as your arms. The cholesterol buildup in the arteries reduces blood   flow which can cause pain and other serious problems. The presence of PVD can place a person at risk for Coronary Artery Disease (CAD).  CAUSES  Causes of PVD can be many. It is usually associated with more than one risk factor such as:   High Cholesterol.  Smoking.  Diabetes.  Lack of exercise or inactivity.  High blood pressure (hypertension).  Obesity.  Family history. SYMPTOMS   When the lower extremities are affected, patients with PVD may experience:  Leg pain with exertion or physical activity. This is called INTERMITTENT CLAUDICATION. This may present as cramping or numbness with physical activity. The location of the pain is associated with the level of blockage. For example, blockage at the  abdominal level (distal abdominal aorta) may result in buttock or hip pain. Lower leg arterial blockage may result in calf pain.  As PVD becomes more severe, pain can develop with less physical activity.  In people with severe PVD, leg pain may occur at rest.  Other PVD signs and symptoms:  Leg numbness or weakness.  Coldness in the affected leg or foot, especially when compared to the other leg.  A change in leg color.  Patients with significant PVD are more prone to ulcers or sores on toes, feet or legs. These may take longer to heal or may reoccur. The ulcers or sores can become infected.  If signs and symptoms of PVD are ignored, gangrene may occur. This can result in the loss of toes or loss of an entire limb.  Not all leg pain is related to PVD. Other medical conditions can cause leg pain such as:  Blood clots (embolism) or Deep Vein Thrombosis.  Inflammation of the blood vessels (vasculitis).  Spinal stenosis. DIAGNOSIS  Diagnosis of PVD can involve several different types of tests. These can include:  Pulse Volume Recording Method (PVR). This test is simple, painless and does not involve the use of X-rays. PVR involves measuring and comparing the blood pressure in the arms and legs. An ABI (Ankle-Brachial Index) is calculated. The normal ratio of blood pressures is 1. As this number becomes smaller, it indicates more severe disease.  < 0.95  indicates significant narrowing in one or more leg vessels.  <0.8 there will usually be pain in the foot, leg or buttock with exercise.  <0.4 will usually have pain in the legs at rest.  <0.25  usually indicates limb threatening PVD.  Doppler detection of pulses in the legs. This test is painless and checks to see if you have a pulses in your legs/feet.  A dye or contrast material (a substance that highlights the blood vessels so they show up on x-ray) may be given to help your caregiver better see the arteries for the following  tests. The dye is eliminated from your body by the kidney's. Your caregiver may order blood work to check your kidney function and other laboratory values before the following tests are performed:  Magnetic Resonance Angiography (MRA). An MRA is a picture study of the blood vessels and arteries. The MRA machine uses a large magnet to produce images of the blood vessels.  Computed Tomography Angiography (CTA). A CTA is a specialized x-ray that looks at how the blood flows in your blood vessels. An IV may be inserted into your arm so contrast dye can be injected.  Angiogram. Is a procedure that uses x-rays to look at your blood vessels. This procedure is minimally invasive, meaning a small incision (cut) is made in your   groin. A small tube (catheter) is then inserted into the artery of your groin. The catheter is guided to the blood vessel or artery your caregiver wants to examine. Contrast dye is injected into the catheter. X-rays are then taken of the blood vessel or artery. After the images are obtained, the catheter is taken out. TREATMENT  Treatment of PVD involves many interventions which may include:  Lifestyle changes:  Quitting smoking.  Exercise.  Following a low fat, low cholesterol diet.  Control of diabetes.  Foot care is very important to the PVD patient. Good foot care can help prevent infection.  Medication:  Cholesterol-lowering medicine.  Blood pressure medicine.  Anti-platelet drugs.  Certain medicines may reduce symptoms of Intermittent Claudication.  Interventional/Surgical options:  Angioplasty. An Angioplasty is a procedure that inflates a balloon in the blocked artery. This opens the blocked artery to improve blood flow.  Stent Implant. A wire mesh tube (stent) is placed in the artery. The stent expands and stays in place, allowing the artery to remain open.  Peripheral Bypass Surgery. This is a surgical procedure that reroutes the blood around a blocked  artery to help improve blood flow. This type of procedure may be performed if Angioplasty or stent implants are not an option. SEEK IMMEDIATE MEDICAL CARE IF:   You develop pain or numbness in your arms or legs.  Your arm or leg turns cold, becomes blue in color.  You develop redness, warmth, swelling and pain in your arms or legs. MAKE SURE YOU:   Understand these instructions.  Will watch your condition.  Will get help right away if you are not doing well or get worse. Document Released: 02/23/2004 Document Revised: 04/09/2011 Document Reviewed: 01/20/2008 Exit[employer] Patient Information 2014 [employer].   

## 2013-01-27 ENCOUNTER — Other Ambulatory Visit (HOSPITAL_COMMUNITY): Payer: PRIVATE HEALTH INSURANCE

## 2013-02-03 ENCOUNTER — Encounter: Payer: Self-pay | Admitting: Family

## 2013-02-03 ENCOUNTER — Ambulatory Visit (INDEPENDENT_AMBULATORY_CARE_PROVIDER_SITE_OTHER): Payer: Self-pay | Admitting: Family

## 2013-02-03 VITALS — BP 121/83 | HR 92 | Temp 98.0°F | Resp 16 | Ht 69.0 in | Wt 160.0 lb

## 2013-02-03 DIAGNOSIS — I739 Peripheral vascular disease, unspecified: Secondary | ICD-10-CM

## 2013-02-03 DIAGNOSIS — Z4802 Encounter for removal of sutures: Secondary | ICD-10-CM

## 2013-02-03 NOTE — Progress Notes (Signed)
[facility] [place] HISTORY AND PHYSICAL -PAD  History of Present Illness Tommy Malone is a 48 y.o. male patient of Dr. Imogene Burnhen who had an aortobifem BPG on 01/07/13. He returns today for staples removal, states he has trouble standing erect as the staples pull at his chest and abdomen. He denies any claudication symptoms, denies non-healing wounds, denies dyspnea, denies chest pain. He is smoking the same amount as last visit, but states Chantix worked for him before and he will have enough money to afford Chantix in April.   Past Medical History  Diagnosis Date  . Acid reflux   . Depression   . Anxiety   . Peripheral vascular disease   . Hemorrhoid     Social History History  Substance Use Topics  . Smoking status: Current Every Day Smoker -- 1.00 packs/day for 30 years    Types: Cigarettes  . Smokeless tobacco: Never Used  . Alcohol Use: No     Comment: occasional    Family History Family History  Problem Relation Age of Onset  . Heart disease Mother   . Heart attack Mother   . Hyperlipidemia Mother   . Heart disease Maternal Grandmother   . Cancer Paternal Grandfather   . Heart disease Father   . Heart attack Father   . Hyperlipidemia Father     Past Surgical History  Procedure Laterality Date  . Elbow surgery Bilateral   . Kidney stone surgery      x2  . Aorta - bilateral femoral artery bypass graft N/A 01/07/2013    Procedure: AORTA BIFEMORAL BYPASS GRAFT;  Surgeon: Fransisco HertzBrian L Chen, MD;  [facility];  Service: Vascular;  Laterality: N/A;    Allergies  Allergen Reactions  . Simvastatin Nausea Only    Stomach aches    Current Outpatient Prescriptions  Medication Sig Dispense Refill  . aspirin 81 MG tablet Take 81 mg by mouth daily.      [name]. atorvastatin (LIPITOR) 10 MG tablet Take 1 tablet (10 mg total) by mouth daily.  90 tablet  3  . CIALIS 20 MG tablet Take 20 mg by mouth daily as needed for erectile dysfunction.       . clonazePAM  (KLONOPIN) 0.5 MG tablet Take 0.5 mg by mouth 2 (two) times daily as needed for anxiety.      [name]. ibuprofen (ADVIL,MOTRIN) 200 MG tablet Take 600 mg by mouth every 6 (six) hours as needed for headache or moderate pain.      [name]. omeprazole (PRILOSEC) 20 MG capsule Take 20 mg by mouth daily.      [name]. oxyCODONE (ROXICODONE) 5 MG immediate release tablet Take 1 tablet (5 mg total) by mouth every 6 (six) hours as needed for severe pain.  20 tablet  0  . sertraline (ZOLOFT) 100 MG tablet Take 100 mg by mouth daily.       No current facility-administered medications for this visit.    ROS: See HPI for pertinent positives and negatives.   Physical Examination  There were no vitals filed for this visit.  Filed Vitals:   02/03/13 1151  BP: 121/83  Pulse: 92  Temp: 98 F (36.7 C)  Resp: 16   Filed Weights   02/03/13 1151  Weight: 160 lb (72.576 kg)   Body mass index is 23.62 kg/(m^2).  General: A&O x 3, WDWN,  Gait: normal  Eyes: PERRLA,  Pulmonary: CTAB, without wheezes , rales or rhonchi  Cardiac: regular Rythm ,  without murmur  Carotid Bruits  Left  Right    Positive  Negative   Aorta: is not palpable  Radial pulses: : Right is 2+, left is not palpable.  Brachial pulses; both are 3+  VASCULAR EXAM:  Extremities without ischemic changes  without Gangrene; without open wounds.  LE Pulses  LEFT  RIGHT   FEMORAL  palpable  palpable , old hematoma, same size as last month  POPLITEAL  not palpable  not palpable   POSTERIOR TIBIAL  palpable  palpable   DORSALIS PEDIS  ANTERIOR TIBIAL  palpable  palpable   Abdomen: soft, no masses. Minimal tenderness on palpation s/p aortobifem BPG 27 days ago. Abdominal incision is well proximated, staples in place, no swelling, no drainage, and minimal redness. Both groin incisions are well proximated , Dermabond over incisions, no drainage, no bruising, swelling is moderate on the right , mild on the left.  Skin: no rashes, no ulcers noted.   Musculoskeletal: no muscle wasting or atrophy.  Neurologic: A&O X 3; Appropriate Affect ; SENSATION: normal; MOTOR FUNCTION: moving all extremities equally, motor strength 5/5 throughout. Speech is fluent/normal. CN 2-12 intact     ASSESSMENT: Tommy Malone is a 47 y.o. male who presents for staples removal s/p aortobifem BPG on 01/07/13. He has no claudication symptoms, he feels much improved. He is still smoking but plans to quit when he can afford Chantix in April.  PLAN:  Abdomen surgical staples were removed.  I discussed in depth with the patient the nature of atherosclerosis, and emphasized the importance of maximal medical management including strict control of blood pressure, blood glucose, and lipid levels, obtaining regular exercise, and cessation of smoking.  The patient is aware that without maximal medical management the underlying atherosclerotic disease process will progress, limiting the benefit of any interventions.  Based on the patient's vascular studies and examination, pt will return to clinic as scheduled on 02/06/13 for carotid Duplex and to see Dr. Imogene Burn. He was again counseled re smoking cessation.  The patient was given information about PAD including signs, symptoms, treatment, what symptoms should prompt the patient to seek immediate medical care, and risk reduction measures to take on his visit last month.  Charisse March, RN, MSN, FNP-C [facility] [place] Phone: [phone]  Clinic MD: Early  02/03/2013 11:51 AM

## 2013-02-05 ENCOUNTER — Encounter: Payer: Self-pay | Admitting: Vascular Surgery

## 2013-02-06 ENCOUNTER — Ambulatory Visit (HOSPITAL_COMMUNITY)
Admission: RE | Admit: 2013-02-06 | Discharge: 2013-02-06 | Disposition: A | Discharge status: Home / Self Care | Payer: PRIVATE HEALTH INSURANCE | Source: Ambulatory Visit | Attending: Vascular Surgery | Admitting: Vascular Surgery

## 2013-02-06 ENCOUNTER — Ambulatory Visit (INDEPENDENT_AMBULATORY_CARE_PROVIDER_SITE_OTHER): Payer: PRIVATE HEALTH INSURANCE | Admitting: Vascular Surgery

## 2013-02-06 ENCOUNTER — Encounter: Payer: Self-pay | Admitting: Vascular Surgery

## 2013-02-06 VITALS — BP 126/83 | HR 95 | Ht 69.0 in | Wt 166.0 lb

## 2013-02-06 DIAGNOSIS — I6529 Occlusion and stenosis of unspecified carotid artery: Secondary | ICD-10-CM

## 2013-02-06 DIAGNOSIS — I739 Peripheral vascular disease, unspecified: Secondary | ICD-10-CM

## 2013-02-06 DIAGNOSIS — I7409 Other arterial embolism and thrombosis of abdominal aorta: Secondary | ICD-10-CM

## 2013-02-06 NOTE — Progress Notes (Signed)
    Post-operative Open AAA Repair  History of Present Illness  Tommy Malone is a 48 y.o. male who presents post-op s/p ABF for essentially distal aortic occlusion (Date: 01/07/13).  The patient has had no back or abdominal pain.  Abd pain is resolved.  Pt would like to resume work.  Leg sx have resolved.  Pt also here for B carotid duplex for B carotid bruits.  For VQI Use Only  PRE-ADM LIVING: Home  AMB STATUS: Ambulatory  Physical Examination  Filed Vitals:   02/06/13 1220  BP: 126/83  Pulse: 95  Height: 5\' 9"  (1.753 m)  Weight: 166 lb (75.297 kg)  SpO2: 100%    Gastrointestinal: soft, NTND, -G/R, - HSM, - masses, - CVAT B, inc healed  Vascular: Palpable bilateral pedal pulses, both groin incisions healed  Non-Invasive Vascular Imaging  Carotid Duplex (Date: 02/06/2013):   R ICA stenosis: <40%  R VA: patent and antegrade  L ICA stenosis: 40-59%  L VA: patent and antegrade  L SCA stenosis  Medical Decision Making  Tommy Malone is a 48 y.o. male who presents s/p successful ABF with resolution of B rest pain   BLE ABI in 6 months  B carotid duplex annually  Thank you for allowing us to participate in this patient's care.  Leonides SakeBrian Lezette Kitts, MD [facility] [place] Office: [phone] Pager: [phone]  02/06/2013, 12:49 PM

## 2013-02-06 NOTE — Addendum Note (Signed)
Addended by: Sharee PimpleMCCHESNEY, MARILYN K on: 02/06/2013 01:58 PM   Modules accepted: Orders

## 2013-08-07 ENCOUNTER — Encounter (HOSPITAL_COMMUNITY): Payer: PRIVATE HEALTH INSURANCE

## 2013-08-07 ENCOUNTER — Ambulatory Visit: Payer: PRIVATE HEALTH INSURANCE | Admitting: Vascular Surgery

## 2013-08-13 ENCOUNTER — Encounter: Payer: Self-pay | Admitting: Family

## 2013-08-14 ENCOUNTER — Ambulatory Visit (INDEPENDENT_AMBULATORY_CARE_PROVIDER_SITE_OTHER): Payer: PRIVATE HEALTH INSURANCE | Admitting: Family

## 2013-08-14 ENCOUNTER — Encounter: Payer: Self-pay | Admitting: Family

## 2013-08-14 ENCOUNTER — Ambulatory Visit (HOSPITAL_COMMUNITY)
Admission: RE | Admit: 2013-08-14 | Discharge: 2013-08-14 | Disposition: A | Discharge status: Home / Self Care | Payer: PRIVATE HEALTH INSURANCE | Source: Ambulatory Visit | Attending: Family | Admitting: Family

## 2013-08-14 VITALS — BP 107/80 | HR 58 | Resp 14 | Ht 69.0 in | Wt 170.0 lb

## 2013-08-14 DIAGNOSIS — I739 Peripheral vascular disease, unspecified: Secondary | ICD-10-CM

## 2013-08-14 DIAGNOSIS — I7409 Other arterial embolism and thrombosis of abdominal aorta: Secondary | ICD-10-CM | POA: Insufficient documentation

## 2013-08-14 NOTE — Patient Instructions (Addendum)
Peripheral Vascular Disease Peripheral Vascular Disease (PVD), also called Peripheral Arterial Disease (PAD), is a circulation problem caused by cholesterol (atherosclerotic plaque) deposits in the arteries. PVD commonly occurs in the lower extremities (legs) but it can occur in other areas of the body, such as your arms. The cholesterol buildup in the arteries reduces blood flow which can cause pain and other serious problems. The presence of PVD can place a person at risk for Coronary Artery Disease (CAD).  CAUSES  Causes of PVD can be many. It is usually associated with more than one risk factor such as:   High Cholesterol.  Smoking.  Diabetes.  Lack of exercise or inactivity.  High blood pressure (hypertension).  Obesity.  Family history. SYMPTOMS   When the lower extremities are affected, patients with PVD may experience:  Leg pain with exertion or physical activity. This is called INTERMITTENT CLAUDICATION. This may present as cramping or numbness with physical activity. The location of the pain is associated with the level of blockage. For example, blockage at the abdominal level (distal abdominal aorta) may result in buttock or hip pain. Lower leg arterial blockage may result in calf pain.  As PVD becomes more severe, pain can develop with less physical activity.  In people with severe PVD, leg pain may occur at rest.  Other PVD signs and symptoms:  Leg numbness or weakness.  Coldness in the affected leg or foot, especially when compared to the other leg.  A change in leg color.  Patients with significant PVD are more prone to ulcers or sores on toes, feet or legs. These may take longer to heal or may reoccur. The ulcers or sores can become infected.  If signs and symptoms of PVD are ignored, gangrene may occur. This can result in the loss of toes or loss of an entire limb.  Not all leg pain is related to PVD. Other medical conditions can cause leg pain such  as:  Blood clots (embolism) or Deep Vein Thrombosis.  Inflammation of the blood vessels (vasculitis).  Spinal stenosis. DIAGNOSIS  Diagnosis of PVD can involve several different types of tests. These can include:  Pulse Volume Recording Method (PVR). This test is simple, painless and does not involve the use of X-rays. PVR involves measuring and comparing the blood pressure in the arms and legs. An ABI (Ankle-Brachial Index) is calculated. The normal ratio of blood pressures is 1. As this number becomes smaller, it indicates more severe disease.  < 0.95 - indicates significant narrowing in one or more leg vessels.  <0.8 - there will usually be pain in the foot, leg or buttock with exercise.  <0.4 - will usually have pain in the legs at rest.  <0.25 - usually indicates limb threatening PVD.  Doppler detection of pulses in the legs. This test is painless and checks to see if you have a pulses in your legs/feet.  A dye or contrast material (a substance that highlights the blood vessels so they show up on x-ray) may be given to help your caregiver better see the arteries for the following tests. The dye is eliminated from your body by the kidney's. Your caregiver may order blood work to check your kidney function and other laboratory values before the following tests are performed:  Magnetic Resonance Angiography (MRA). An MRA is a picture study of the blood vessels and arteries. The MRA machine uses a large magnet to produce images of the blood vessels.  Computed Tomography Angiography (CTA). A CTA   is a specialized x-ray that looks at how the blood flows in your blood vessels. An IV may be inserted into your arm so contrast dye can be injected.  Angiogram. Is a procedure that uses x-rays to look at your blood vessels. This procedure is minimally invasive, meaning a small incision (cut) is made in your groin. A small tube (catheter) is then inserted into the artery of your groin. The catheter  is guided to the blood vessel or artery your caregiver wants to examine. Contrast dye is injected into the catheter. X-rays are then taken of the blood vessel or artery. After the images are obtained, the catheter is taken out. TREATMENT  Treatment of PVD involves many interventions which may include:  Lifestyle changes:  Quitting smoking.  Exercise.  Following a low fat, low cholesterol diet.  Control of diabetes.  Foot care is very important to the PVD patient. Good foot care can help prevent infection.  Medication:  Cholesterol-lowering medicine.  Blood pressure medicine.  Anti-platelet drugs.  Certain medicines may reduce symptoms of Intermittent Claudication.  Interventional/Surgical options:  Angioplasty. An Angioplasty is a procedure that inflates a balloon in the blocked artery. This opens the blocked artery to improve blood flow.  Stent Implant. A wire mesh tube (stent) is placed in the artery. The stent expands and stays in place, allowing the artery to remain open.  Peripheral Bypass Surgery. This is a surgical procedure that reroutes the blood around a blocked artery to help improve blood flow. This type of procedure may be performed if Angioplasty or stent implants are not an option. SEEK IMMEDIATE MEDICAL CARE IF:   You develop pain or numbness in your arms or legs.  Your arm or leg turns cold, becomes blue in color.  You develop redness, warmth, swelling and pain in your arms or legs. MAKE SURE YOU:   Understand these instructions.  Will watch your condition.  Will get help right away if you are not doing well or get worse. Document Released: 02/23/2004 Document Revised: 04/09/2011 Document Reviewed: 01/20/2008 [facility] Patient Information 2015 [place], [place]. This information is not intended to replace advice given to you by your health care provider. Make sure you discuss any questions you have with your health care provider.   Stroke  Prevention Some medical conditions and behaviors are associated with an increased chance of having a stroke. You may prevent a stroke by making healthy choices and managing medical conditions. HOW CAN I REDUCE MY RISK OF HAVING A STROKE?   Stay physically active. Get at least 30 minutes of activity on most or all days.  Do not smoke. It may also be helpful to avoid exposure to secondhand smoke.  Limit alcohol use. Moderate alcohol use is considered to be:  No more than 2 drinks per day for men.  No more than 1 drink per day for nonpregnant women.  Eat healthy foods. This involves  Eating 5 or more servings of fruits and vegetables a day.  Following a diet that addresses high blood pressure (hypertension), high cholesterol, diabetes, or obesity.  Manage your cholesterol levels.  A diet low in saturated fat, trans fat, and cholesterol and high in fiber may control cholesterol levels.  Take any prescribed medicines to control cholesterol as directed by your health care provider.  Manage your diabetes.  A controlled-carbohydrate, controlled-sugar diet is recommended to manage diabetes.  Take any prescribed medicines to control diabetes as directed by your health care provider.  Control your hypertension.  A low-salt (sodium), low-saturated fat, low-trans fat, and low-cholesterol diet is recommended to manage hypertension.  Take any prescribed medicines to control hypertension as directed by your health care provider.  Maintain a healthy weight.  A reduced-calorie, low-sodium, low-saturated fat, low-trans fat, low-cholesterol diet is recommended to manage weight.  Stop drug abuse.  Avoid taking birth control pills.  Talk to your health care provider about the risks of taking birth control pills if you are over 51 years old, smoke, get migraines, or have ever had a blood clot.  Get evaluated for sleep disorders (sleep apnea).  Talk to your health care provider about  getting a sleep evaluation if you snore a lot or have excessive sleepiness.  Take medicines as directed by your health care provider.  For some people, aspirin or blood thinners (anticoagulants) are helpful in reducing the risk of forming abnormal blood clots that can lead to stroke. If you have the irregular heart rhythm of atrial fibrillation, you should be on a blood thinner unless there is a good reason you cannot take them.  Understand all your medicine instructions.  Make sure that other other conditions (such as anemia or atherosclerosis) are addressed. SEEK IMMEDIATE MEDICAL CARE IF:   You have sudden weakness or numbness of the face, arm, or leg, especially on one side of the body.  Your face or eyelid droops to one side.  You have sudden confusion.  You have trouble speaking (aphasia) or understanding.  You have sudden trouble seeing in one or both eyes.  You have sudden trouble walking.  You have dizziness.  You have a loss of balance or coordination.  You have a sudden, severe headache with no known cause.  You have new chest pain or an irregular heartbeat. Any of these symptoms may represent a serious problem that is an emergency. Do not wait to see if the symptoms will go away. Get medical help at once. Call your local emergency services  (911 in [place] Do not drive yourself to the hospital. Document Released: 02/23/2004 Document Revised: 11/05/2012 Document Reviewed: 07/18/2012 [facility] Patient Information 2015 [place], [place]. This information is not intended to replace advice given to you by your health care provider. Make sure you discuss any questions you have with your health care provider.  Smoking Cessation Quitting smoking is important to your health and has many advantages. However, it is not always easy to quit since nicotine is a very addictive drug. Often times, people try 3 times or more before being able to quit. This document explains the best ways for  you to prepare to quit smoking. Quitting takes hard work and a lot of effort, but you can do it. ADVANTAGES OF QUITTING SMOKING  You will live longer, feel better, and live better.  Your body will feel the impact of quitting smoking almost immediately.  Within 20 minutes, blood pressure decreases. Your pulse returns to its normal level.  After 8 hours, carbon monoxide levels in the blood return to normal. Your oxygen level increases.  After 24 hours, the chance of having a heart attack starts to decrease. Your breath, hair, and body stop smelling like smoke.  After 48 hours, damaged nerve endings begin to recover. Your sense of taste and smell improve.  After 72 hours, the body is virtually free of nicotine. Your bronchial tubes relax and breathing becomes easier.  After 2 to 12 weeks, lungs can hold more air. Exercise becomes easier and circulation improves.  The risk of having  a heart attack, stroke, cancer, or lung disease is greatly reduced.  After 1 year, the risk of coronary heart disease is cut in half.  After 5 years, the risk of stroke falls to the same as a nonsmoker.  After 10 years, the risk of lung cancer is cut in half and the risk of other cancers decreases significantly.  After 15 years, the risk of coronary heart disease drops, usually to the level of a nonsmoker.  If you are pregnant, quitting smoking will improve your chances of having a healthy baby.  The people you live with, especially any children, will be healthier.  You will have extra money to spend on things other than cigarettes. QUESTIONS TO THINK ABOUT BEFORE ATTEMPTING TO QUIT You may want to talk about your answers with your caregiver.  Why do you want to quit?  If you tried to quit in the past, what helped and what did not?  What will be the most difficult situations for you after you quit? How will you plan to handle them?  Who can help you through the tough times? Your family? Friends? A  caregiver?  What pleasures do you get from smoking? What ways can you still get pleasure if you quit? Here are some questions to ask your caregiver:  How can you help me to be successful at quitting?  What medicine do you think would be best for me and how should I take it?  What should I do if I need more help?  What is smoking withdrawal like? How can I get information on withdrawal? GET READY  Set a quit date.  Change your environment by getting rid of all cigarettes, ashtrays, matches, and lighters in your home, car, or work. Do not let people smoke in your home.  Review your past attempts to quit. Think about what worked and what did not. GET SUPPORT AND ENCOURAGEMENT You have a better chance of being successful if you have help. You can get support in many ways.  Tell your family, friends, and co-workers that you are going to quit and need their support. Ask them not to smoke around you.  Get individual, group, or telephone counseling and support. Programs are available at [facility] and [facility]. Call your local health department for information about programs in your area.  Spiritual beliefs and practices may help some smokers quit.  Download a "quit meter" on your computer to keep track of quit statistics, such as how long you have gone without smoking, cigarettes not smoked, and money saved.  Get a self-help book about quitting smoking and staying off of tobacco. LEARN NEW SKILLS AND BEHAVIORS  Distract yourself from urges to smoke. Talk to someone, go for a walk, or occupy your time with a task.  Change your normal routine. Take a different route to work. Drink tea instead of coffee. Eat breakfast in a different place.  Reduce your stress. Take a hot bath, exercise, or read a book.  Plan something enjoyable to do every day. Reward yourself for not smoking.  Explore interactive web-based programs that specialize in helping you quit. GET MEDICINE AND USE  IT CORRECTLY Medicines can help you stop smoking and decrease the urge to smoke. Combining medicine with the above behavioral methods and support can greatly increase your chances of successfully quitting smoking.  Nicotine replacement therapy helps deliver nicotine to your body without the negative effects and risks of smoking. Nicotine replacement therapy includes nicotine gum, lozenges, inhalers,  nasal sprays, and skin patches. Some may be available over-the-counter and others require a prescription.  Antidepressant medicine helps people abstain from smoking, but how this works is unknown. This medicine is available by prescription.  Nicotinic receptor partial agonist medicine simulates the effect of nicotine in your brain. This medicine is available by prescription. Ask your caregiver for advice about which medicines to use and how to use them based on your health history. Your caregiver will tell you what side effects to look out for if you choose to be on a medicine or therapy. Carefully read the information on the package. Do not use any other product containing nicotine while using a nicotine replacement product.  RELAPSE OR DIFFICULT SITUATIONS Most relapses occur within the first 3 months after quitting. Do not be discouraged if you start smoking again. Remember, most people try several times before finally quitting. You may have symptoms of withdrawal because your body is used to nicotine. You may crave cigarettes, be irritable, feel very hungry, cough often, get headaches, or have difficulty concentrating. The withdrawal symptoms are only temporary. They are strongest when you first quit, but they will go away within 10-14 days. To reduce the chances of relapse, try to:  Avoid drinking alcohol. Drinking lowers your chances of successfully quitting.  Reduce the amount of caffeine you consume. Once you quit smoking, the amount of caffeine in your body increases and can give you symptoms,  such as a rapid heartbeat, sweating, and anxiety.  Avoid smokers because they can make you want to smoke.  Do not let weight gain distract you. Many smokers will gain weight when they quit, usually less than 10 pounds. Eat a healthy diet and stay active. You can always lose the weight gained after you quit.  Find ways to improve your mood other than smoking. FOR MORE INFORMATION  www.smokefree.gov  Document Released: 01/09/2001 Document Revised: 07/17/2011 Document Reviewed: 04/26/2011 [facility] Patient Information 2015 [place], [place]. This information is not intended to replace advice given to you by your health care provider. Make sure you discuss any questions you have with your health care provider.

## 2013-08-14 NOTE — Addendum Note (Signed)
Addended by: Sharee PimpleMCCHESNEY, Shaylie Eklund K on: 08/14/2013 11:02 AM   Modules accepted: Orders

## 2013-08-14 NOTE — Progress Notes (Addendum)
[facility] [place] HISTORY AND PHYSICAL -PAD  History of Present Illness Tommy Malone is a 48 y.o. male patient of Dr. 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  He also has mild/moderate ICA stenosis followed yearly. He returns today for PAD surveillance.  He denies claudication symptoms, denies non healing wounds. Pt denies any history of stroke or TIA, denies tingling or numbness in either hand or arm.  The patient denies New Medical or Surgical History. He denies any ETOH consumption, he walks a great deal on his job.   Pt Diabetic: No  Pt smoker: smoker (3-4 cigarettes/day, up until hospitalized was smoking 1 ppd x 29 yrs)   Pt meds include:  Statin :Yes  ASA: Yes  Other anticoagulants/antiplatelets: no   Past Medical History  Diagnosis Date  . Acid reflux   . Depression   . Anxiety   . Peripheral vascular disease   . Hemorrhoid     Social History History  Substance Use Topics  . Smoking status: Light Tobacco Smoker -- 1.00 packs/day for 30 years    Types: Cigarettes, E-cigarettes  . Smokeless tobacco: Never Used  . Alcohol Use: No     Comment: occasional    Family History Family History  Problem Relation Age of Onset  . Heart disease Mother     NOT  before age 72  . Heart attack Mother   . Hyperlipidemia Mother   . Heart disease Maternal Grandmother   . Cancer Paternal Grandfather   . Heart disease Father     NOT  before age 6  . Heart attack Father   . Hyperlipidemia Father     Past Surgical History  Procedure Laterality Date  . Elbow surgery Bilateral   . Kidney stone surgery      x2  . Aorta - bilateral femoral artery bypass graft N/A 01/07/2013    Procedure: AORTA BIFEMORAL BYPASS GRAFT;  Surgeon: Fransisco Hertz, MD;  Location: [facility] OR;  Service: Vascular;  Laterality: N/A;    Allergies  Allergen Reactions  . Simvastatin Nausea Only    Stomach aches    Current  Outpatient Prescriptions  Medication Sig Dispense Refill  . aspirin 81 MG tablet Take 81 mg by mouth daily.      [name] atorvastatin (LIPITOR) 10 MG tablet Take 1 tablet (10 mg total) by mouth daily.  90 tablet  3  . CIALIS 20 MG tablet Take 20 mg by mouth daily as needed for erectile dysfunction.       . clonazePAM (KLONOPIN) 0.5 MG tablet Take 0.5 mg by mouth 2 (two) times daily as needed for anxiety.      [name] ibuprofen (ADVIL,MOTRIN) 200 MG tablet Take 600 mg by mouth every 6 (six) hours as needed for headache or moderate pain.      [name] omeprazole (PRILOSEC) 20 MG capsule Take 20 mg by mouth daily.      . sertraline (ZOLOFT) 100 MG tablet Take 100 mg by mouth daily.      [name] amoxicillin (AMOXIL) 875 MG tablet       . oxyCODONE (ROXICODONE) 5 MG immediate release tablet Take 1 tablet (5 mg total) by mouth every 6 (six) hours as needed for severe pain.  20 tablet  0   No current facility-administered medications for this visit.    ROS: See HPI for pertinent positives and negatives.   Physical Examination   Filed Vitals:   08/14/13  0935  BP: 107/80  Pulse: 58  Resp: 14  Height: 5\' 9"  (1.753 m)  Weight: 170 lb (77.111 kg)  SpO2: 99%   Body mass index is 25.09 kg/(m^2).  General: A&O x 3, WDWN,  Gait: normal  Eyes: PERRLA,  Pulmonary: CTAB, without wheezes , rales or rhonchi  Cardiac: regular Rythm , without detected murmur   Carotid Bruits  Left  Right    Positive, moderate Positive, soft  Aorta: is not palpable  Radial pulses: : Right is 2+, left is 1+ palpable.  Brachial pulses; both are 3+   VASCULAR EXAM:  Extremities without ischemic changes  without Gangrene; without open wounds.   LE Pulses  LEFT  RIGHT   FEMORAL  palpable  palpable   POPLITEAL   palpable   palpable   POSTERIOR TIBIAL  palpable  palpable   DORSALIS PEDIS  ANTERIOR TIBIAL  palpable  palpable    Abdomen: soft, no masses, no tenderness, no guarding.  Skin: no rashes, no ulcers noted.  Musculoskeletal:  no muscle wasting or atrophy.  Neurologic: A&O X 3; Appropriate Affect ; SENSATION: normal; MOTOR FUNCTION: moving all extremities equally, motor strength 5/5 throughout. Speech is fluent/normal. CN 2-12 intact.   Non-Invasive Vascular Imaging: DATE: 08/14/2013 ABI: RIGHT 1.24, Waveforms: triphasic;  LEFT 1.26, Waveforms: triphasic Previous (01/13/13) ABI's: Right: 1.08, Left: 1.06  ASSESSMENT: Tommy Malone is a 48 y.o. male who is s/p AortoBifemoral BPG for essentially distal aortic occlusion (Date: 01/07/13).  He also has mild/moderate ICA stenosis followed yearly. Bilateral ABI's today indicate no evidence of arterial occlusive disease. He continues to smoke 3-4 cigarettes/day and vapor cigarettes, he does not have DM, he takes a daily ASA and a statin. He does not use ETOH and walks a great deal daily. See Plan.   PLAN:  I discussed in depth with the patient the nature of atherosclerosis, and emphasized the importance of maximal medical management including strict control of blood pressure, blood glucose, and lipid levels, obtaining regular exercise, and cessation of smoking.  The patient is aware that without maximal medical management the underlying atherosclerotic disease process will progress, limiting the benefit of any interventions.  Based on the patient's vascular studies and examination, pt will return to clinic in 6 months for ABI's and carotid Duplex.  The patient was given information about PAD and stroke prevention including signs, symptoms, treatment, what symptoms should prompt the patient to seek immediate medical care, and risk reduction measures to take.  Charisse MarchSuzanne Keaghan Bowens, RN, MSN, FNP-C [facility] [place] Office Phone: [phone]  Clinic MD: Imogene BurnChen  08/14/2013 9:48 AM   VASCULAR QUALITY INITIATIVE FOLLOW UP DATA:  Current smoker: [ x ] yes  [  ] no  Living status: [ x ]  Home  [  ] Nursing home  [  ] Homeless    MEDS:  ASA [ x ]  yes  [  ] no- [  ] medical reason  [  ] non compliant  STATIN  [ x ] yes  [  ] no- [  ] medical reason  [  ] non compliant  Beta blocker [  ] yes  [ x ] no- [  ] medical reason  [  ] non compliant  ACE inhibitor [  ] yes  [  x] no- [  ] medical reason  [  ] non compliant  P2Y12 Antagonist [x  ] none  [  ] clopidogrel-Plavix  [  ]  ticlopidine-Ticlid   [  ] prasugrel-Effient  [  ] ticagrelor- Brilinta    Anticoagulant [x  ] None  [  ] warfarin  [  ] rivaroxaban-Xarelto [  ] dabigatran- Pradaxa  Ambulation: [  x] Amb  [  ] Amb with assistance  [  ] wheelchair  [  ] bedridden  Sx RIGHT:  [ x ] none   [  ] claudication  [  ] rest pain  [  ] tissue loss Sx LEFT:  [ x ] none   [  ] claudication  [  ] rest pain  [  ] tissue loss  Current Patency RIGHT: [ x ] primary  [  ] primary-assisted  [  ] secondary  [  ] occluded Current Patency LEFT:   [x  ] primary  [  ] primary-assisted  [  ] secondary  [  ] occluded  Patency judged by: [  ] doppler  [ x ] palp graft pulse  [  ] palp distal pulse   [  ] ABI increase > 15%   [  ] Duplex  If occluded, when-   ABI: RIGHT: 1.08  LEFT: 1.06  TBI: RIGHT: 0.88  LEFT: 0.97  Infection: [ x ] none  [  ] cellulitis  [  ] deep abscess  [  ] infection of artery or graft  Bypass revision: [ x ] no  [  ] yes- [  ] surg  [  ] catheter based  [  ] both    Date:   Thrombectomy/ lysis/ revision:  [ x ] no  [  ] yes- [  ] surg  [  ] catheter based  [  ] both    Date:   Major amputation:   [  x] no  [  ] minor amp  [  ] BKA  [  ] AKA   [  ] RIGHT  [  ] LEFT Date:

## 2014-01-07 ENCOUNTER — Encounter (HOSPITAL_COMMUNITY): Payer: Self-pay | Admitting: Vascular Surgery

## 2014-02-18 ENCOUNTER — Encounter: Payer: Self-pay | Admitting: Family

## 2014-02-19 ENCOUNTER — Other Ambulatory Visit (HOSPITAL_COMMUNITY): Payer: PRIVATE HEALTH INSURANCE

## 2014-02-19 ENCOUNTER — Ambulatory Visit: Payer: PRIVATE HEALTH INSURANCE | Admitting: Family

## 2014-02-19 ENCOUNTER — Encounter (HOSPITAL_COMMUNITY): Payer: PRIVATE HEALTH INSURANCE

## 2014-03-10 ENCOUNTER — Encounter: Payer: Self-pay | Admitting: Family

## 2014-03-11 ENCOUNTER — Other Ambulatory Visit (HOSPITAL_COMMUNITY): Payer: PRIVATE HEALTH INSURANCE

## 2014-03-11 ENCOUNTER — Ambulatory Visit: Payer: PRIVATE HEALTH INSURANCE | Admitting: Family

## 2014-03-11 ENCOUNTER — Encounter (HOSPITAL_COMMUNITY): Payer: PRIVATE HEALTH INSURANCE

## 2014-05-26 ENCOUNTER — Encounter: Payer: Self-pay | Admitting: Family

## 2014-05-27 ENCOUNTER — Other Ambulatory Visit (HOSPITAL_COMMUNITY): Payer: PRIVATE HEALTH INSURANCE

## 2014-05-27 ENCOUNTER — Encounter (HOSPITAL_COMMUNITY): Payer: PRIVATE HEALTH INSURANCE

## 2014-05-27 ENCOUNTER — Ambulatory Visit: Payer: PRIVATE HEALTH INSURANCE | Admitting: Family

## 2014-07-11 ENCOUNTER — Emergency Department (HOSPITAL_COMMUNITY)
Admission: EM | Admit: 2014-07-11 | Discharge: 2014-07-11 | Disposition: A | Discharge status: Home / Self Care | Payer: PRIVATE HEALTH INSURANCE | Attending: Emergency Medicine | Admitting: Emergency Medicine

## 2014-07-11 ENCOUNTER — Encounter (HOSPITAL_COMMUNITY): Payer: Self-pay | Admitting: *Deleted

## 2014-07-11 ENCOUNTER — Emergency Department (HOSPITAL_COMMUNITY): Payer: PRIVATE HEALTH INSURANCE

## 2014-07-11 DIAGNOSIS — Y990 Civilian activity done for income or pay: Secondary | ICD-10-CM | POA: Insufficient documentation

## 2014-07-11 DIAGNOSIS — Z720 Tobacco use: Secondary | ICD-10-CM | POA: Insufficient documentation

## 2014-07-11 DIAGNOSIS — F419 Anxiety disorder, unspecified: Secondary | ICD-10-CM | POA: Insufficient documentation

## 2014-07-11 DIAGNOSIS — Z8679 Personal history of other diseases of the circulatory system: Secondary | ICD-10-CM | POA: Insufficient documentation

## 2014-07-11 DIAGNOSIS — R0781 Pleurodynia: Secondary | ICD-10-CM

## 2014-07-11 DIAGNOSIS — Y9389 Activity, other specified: Secondary | ICD-10-CM | POA: Insufficient documentation

## 2014-07-11 DIAGNOSIS — Y9289 Other specified places as the place of occurrence of the external cause: Secondary | ICD-10-CM | POA: Insufficient documentation

## 2014-07-11 DIAGNOSIS — Z7982 Long term (current) use of aspirin: Secondary | ICD-10-CM | POA: Insufficient documentation

## 2014-07-11 DIAGNOSIS — S299XXA Unspecified injury of thorax, initial encounter: Secondary | ICD-10-CM | POA: Insufficient documentation

## 2014-07-11 DIAGNOSIS — K219 Gastro-esophageal reflux disease without esophagitis: Secondary | ICD-10-CM | POA: Insufficient documentation

## 2014-07-11 DIAGNOSIS — Z79899 Other long term (current) drug therapy: Secondary | ICD-10-CM | POA: Insufficient documentation

## 2014-07-11 DIAGNOSIS — F329 Major depressive disorder, single episode, unspecified: Secondary | ICD-10-CM | POA: Insufficient documentation

## 2014-07-11 MED ORDER — OXYCODONE-ACETAMINOPHEN 5-325 MG PO TABS
1.0000 | ORAL_TABLET | Freq: Once | ORAL | Status: AC
  Administered 2014-07-11: 1 via ORAL
  Filled 2014-07-11: qty 1

## 2014-07-11 MED ORDER — IBUPROFEN 800 MG PO TABS: 800.0000 mg | ORAL_TABLET | Freq: Three times a day (TID) | ORAL | Status: DC

## 2014-07-11 MED ORDER — HYDROCODONE-ACETAMINOPHEN 5-325 MG PO TABS: 1.0000 | ORAL_TABLET | Freq: Four times a day (QID) | ORAL | Status: DC | PRN
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
## 2014-07-11 NOTE — Discharge Instructions (Signed)
Take pain medication as prescribed.  Do not drive or operate heavy machinery for 4-6 hours after taking medication. °

## 2014-12-30 DIAGNOSIS — I214 Non-ST elevation (NSTEMI) myocardial infarction: Secondary | ICD-10-CM

## 2014-12-30 HISTORY — DX: Non-ST elevation (NSTEMI) myocardial infarction: I21.4

## 2015-01-05 ENCOUNTER — Inpatient Hospital Stay (HOSPITAL_COMMUNITY)
Admission: EM | Admit: 2015-01-05 | Discharge: 2015-01-07 | DRG: 247 | Disposition: A | Discharge status: Home / Self Care | Payer: PRIVATE HEALTH INSURANCE | Attending: Internal Medicine | Admitting: Internal Medicine

## 2015-01-05 ENCOUNTER — Encounter (HOSPITAL_COMMUNITY): Payer: Self-pay | Admitting: *Deleted

## 2015-01-05 ENCOUNTER — Emergency Department (HOSPITAL_COMMUNITY): Payer: Self-pay

## 2015-01-05 DIAGNOSIS — R0609 Other forms of dyspnea: Secondary | ICD-10-CM | POA: Diagnosis present

## 2015-01-05 DIAGNOSIS — Z9861 Coronary angioplasty status: Secondary | ICD-10-CM

## 2015-01-05 DIAGNOSIS — K219 Gastro-esophageal reflux disease without esophagitis: Secondary | ICD-10-CM | POA: Diagnosis present

## 2015-01-05 DIAGNOSIS — F17200 Nicotine dependence, unspecified, uncomplicated: Secondary | ICD-10-CM | POA: Diagnosis present

## 2015-01-05 DIAGNOSIS — I25110 Atherosclerotic heart disease of native coronary artery with unstable angina pectoris: Secondary | ICD-10-CM | POA: Diagnosis present

## 2015-01-05 DIAGNOSIS — F329 Major depressive disorder, single episode, unspecified: Secondary | ICD-10-CM | POA: Diagnosis present

## 2015-01-05 DIAGNOSIS — I708 Atherosclerosis of other arteries: Secondary | ICD-10-CM | POA: Diagnosis present

## 2015-01-05 DIAGNOSIS — I779 Disorder of arteries and arterioles, unspecified: Secondary | ICD-10-CM

## 2015-01-05 DIAGNOSIS — Z720 Tobacco use: Secondary | ICD-10-CM

## 2015-01-05 DIAGNOSIS — I739 Peripheral vascular disease, unspecified: Secondary | ICD-10-CM | POA: Diagnosis present

## 2015-01-05 DIAGNOSIS — Z955 Presence of coronary angioplasty implant and graft: Secondary | ICD-10-CM

## 2015-01-05 DIAGNOSIS — I214 Non-ST elevation (NSTEMI) myocardial infarction: Principal | ICD-10-CM | POA: Diagnosis present

## 2015-01-05 DIAGNOSIS — R61 Generalized hyperhidrosis: Secondary | ICD-10-CM | POA: Diagnosis present

## 2015-01-05 DIAGNOSIS — I200 Unstable angina: Secondary | ICD-10-CM

## 2015-01-05 DIAGNOSIS — F419 Anxiety disorder, unspecified: Secondary | ICD-10-CM | POA: Diagnosis present

## 2015-01-05 DIAGNOSIS — Z8249 Family history of ischemic heart disease and other diseases of the circulatory system: Secondary | ICD-10-CM

## 2015-01-05 DIAGNOSIS — I6523 Occlusion and stenosis of bilateral carotid arteries: Secondary | ICD-10-CM | POA: Diagnosis present

## 2015-01-05 DIAGNOSIS — E785 Hyperlipidemia, unspecified: Secondary | ICD-10-CM | POA: Diagnosis present

## 2015-01-05 DIAGNOSIS — I2510 Atherosclerotic heart disease of native coronary artery without angina pectoris: Secondary | ICD-10-CM | POA: Insufficient documentation

## 2015-01-05 DIAGNOSIS — Z888 Allergy status to other drugs, medicaments and biological substances status: Secondary | ICD-10-CM

## 2015-01-05 HISTORY — DX: Personal history of urinary calculi: Z87.442

## 2015-01-05 HISTORY — DX: Non-ST elevation (NSTEMI) myocardial infarction: I21.4

## 2015-01-05 LAB — I-STAT TROPONIN, ED: Troponin i, poc: 0.11 ng/mL (ref 0.00–0.08)

## 2015-01-05 LAB — CBC
HEMATOCRIT: 50 % (ref 39.0–52.0)
Hemoglobin: 16.9 g/dL (ref 13.0–17.0)
MCH: 30.1 pg (ref 26.0–34.0)
MCHC: 33.8 g/dL (ref 30.0–36.0)
MCV: 89 fL (ref 78.0–100.0)
Platelets: 143 10*3/uL — ABNORMAL LOW (ref 150–400)
RBC: 5.62 MIL/uL (ref 4.22–5.81)
RDW: 13.2 % (ref 11.5–15.5)
WBC: 9.6 10*3/uL (ref 4.0–10.5)

## 2015-01-05 LAB — BASIC METABOLIC PANEL
Anion gap: 10 (ref 5–15)
BUN: 10 mg/dL (ref 6–20)
CHLORIDE: 106 mmol/L (ref 101–111)
CO2: 20 mmol/L — AB (ref 22–32)
CREATININE: 1.17 mg/dL (ref 0.61–1.24)
Calcium: 8.6 mg/dL — ABNORMAL LOW (ref 8.9–10.3)
GFR calc non Af Amer: >60 mL/min (ref >60)
Glucose, Bld: 99 mg/dL (ref 65–99)
Potassium: 4.3 mmol/L (ref 3.5–5.1)
Sodium: 136 mmol/L (ref 135–145)

## 2015-01-05 LAB — APTT: APTT: 28 s (ref 24–37)

## 2015-01-05 LAB — PROTIME-INR
INR: 0.94 (ref 0.00–1.49)
Prothrombin Time: 12.8 seconds (ref 11.6–15.2)

## 2015-01-05 LAB — TROPONIN I
TROPONIN I: 0.3 ng/mL — AB (ref <0.031)
TROPONIN I: 1.06 ng/mL — AB (ref <0.031)

## 2015-01-05 LAB — HEPARIN LEVEL (UNFRACTIONATED): Heparin Unfractionated: 0.22 IU/mL — ABNORMAL LOW (ref 0.30–0.70)

## 2015-01-05 MED ORDER — ONDANSETRON HCL 4 MG/2ML IJ SOLN: 4.0000 mg | Freq: Four times a day (QID) | INTRAMUSCULAR | Status: DC | PRN

## 2015-01-05 MED ORDER — ASPIRIN EC 81 MG PO TBEC
81.0000 mg | DELAYED_RELEASE_TABLET | Freq: Every day | ORAL | Status: DC
  Administered 2015-01-07: 81 mg via ORAL
  Filled 2015-01-05: qty 1

## 2015-01-05 MED ORDER — SODIUM CHLORIDE 0.9 % IJ SOLN: 3.0000 mL | INTRAMUSCULAR | Status: DC | PRN

## 2015-01-05 MED ORDER — HEPARIN (PORCINE) IN NACL 100-0.45 UNIT/ML-% IJ SOLN
1000.0000 [IU]/h | INTRAMUSCULAR | Status: DC
  Administered 2015-01-05: 1000 [IU]/h via INTRAVENOUS
  Filled 2015-01-05: qty 250

## 2015-01-05 MED ORDER — ASPIRIN 81 MG PO CHEW
324.0000 mg | CHEWABLE_TABLET | ORAL | Status: AC
  Administered 2015-01-05: 324 mg via ORAL
  Filled 2015-01-05: qty 4

## 2015-01-05 MED ORDER — NITROGLYCERIN 0.4 MG SL SUBL
0.4000 mg | SUBLINGUAL_TABLET | SUBLINGUAL | Status: DC | PRN
  Administered 2015-01-05 (×2): 0.4 mg via SUBLINGUAL
  Filled 2015-01-05: qty 1

## 2015-01-05 MED ORDER — ACETAMINOPHEN 325 MG PO TABS
650.0000 mg | ORAL_TABLET | ORAL | Status: DC | PRN
  Administered 2015-01-05: 650 mg via ORAL
  Filled 2015-01-05: qty 2

## 2015-01-05 MED ORDER — ASPIRIN 300 MG RE SUPP: 300.0000 mg | RECTAL | Status: AC

## 2015-01-05 MED ORDER — ATORVASTATIN CALCIUM 80 MG PO TABS
80.0000 mg | ORAL_TABLET | Freq: Every day | ORAL | Status: DC
  Administered 2015-01-05 – 2015-01-06 (×2): 80 mg via ORAL
  Filled 2015-01-05 (×2): qty 1

## 2015-01-05 MED ORDER — SODIUM CHLORIDE 0.9 % IV SOLN: 250.0000 mL | INTRAVENOUS | Status: DC | PRN

## 2015-01-05 MED ORDER — METOPROLOL TARTRATE 12.5 MG HALF TABLET
12.5000 mg | ORAL_TABLET | Freq: Two times a day (BID) | ORAL | Status: DC
  Administered 2015-01-05 – 2015-01-07 (×4): 12.5 mg via ORAL
  Filled 2015-01-05 (×5): qty 1

## 2015-01-05 MED ORDER — SODIUM CHLORIDE 0.9 % IJ SOLN
3.0000 mL | Freq: Two times a day (BID) | INTRAMUSCULAR | Status: DC
  Administered 2015-01-06: 3 mL via INTRAVENOUS

## 2015-01-05 MED ORDER — NITROGLYCERIN IN D5W 200-5 MCG/ML-% IV SOLN
10.0000 ug/min | INTRAVENOUS | Status: DC
  Administered 2015-01-05: 10 ug/min via INTRAVENOUS
  Filled 2015-01-05: qty 250

## 2015-01-05 MED ORDER — ASPIRIN 81 MG PO CHEW
81.0000 mg | CHEWABLE_TABLET | ORAL | Status: AC
  Administered 2015-01-06: 81 mg via ORAL
  Filled 2015-01-05: qty 1

## 2015-01-05 MED ORDER — ASPIRIN 81 MG PO CHEW
324.0000 mg | CHEWABLE_TABLET | Freq: Once | ORAL | Status: AC
  Administered 2015-01-05: 324 mg via ORAL
  Filled 2015-01-05: qty 4

## 2015-01-05 MED ORDER — NITROGLYCERIN 0.4 MG SL SUBL: 0.4000 mg | SUBLINGUAL_TABLET | SUBLINGUAL | Status: DC | PRN

## 2015-01-05 MED ORDER — SODIUM CHLORIDE 0.9 % WEIGHT BASED INFUSION: 3.0000 mL/kg/h | INTRAVENOUS | Status: DC

## 2015-01-05 MED ORDER — HEPARIN (PORCINE) IN NACL 100-0.45 UNIT/ML-% IJ SOLN
1300.0000 [IU]/h | INTRAMUSCULAR | Status: DC
  Filled 2015-01-05: qty 250

## 2015-01-05 MED ORDER — HEPARIN BOLUS VIA INFUSION
4000.0000 [IU] | Freq: Once | INTRAVENOUS | Status: AC
  Administered 2015-01-05: 4000 [IU] via INTRAVENOUS
  Filled 2015-01-05: qty 4000

## 2015-01-05 MED ORDER — INFLUENZA VAC SPLIT QUAD 0.5 ML IM SUSY
0.5000 mL | PREFILLED_SYRINGE | INTRAMUSCULAR | Status: AC
  Administered 2015-01-07: 0.5 mL via INTRAMUSCULAR
  Filled 2015-01-05: qty 0.5

## 2015-01-05 MED ORDER — PNEUMOCOCCAL VAC POLYVALENT 25 MCG/0.5ML IJ INJ
0.5000 mL | INJECTION | INTRAMUSCULAR | Status: AC
  Administered 2015-01-07: 09:00:00 0.5 mL via INTRAMUSCULAR
  Filled 2015-01-05: qty 0.5

## 2015-01-05 MED ORDER — PANTOPRAZOLE SODIUM 40 MG PO TBEC
40.0000 mg | DELAYED_RELEASE_TABLET | Freq: Every day | ORAL | Status: DC | PRN
Start: 2015-01-05 — End: 2015-01-07
  Administered 2015-01-05: 40 mg via ORAL
  Filled 2015-01-05: qty 1

## 2015-01-05 MED ORDER — SODIUM CHLORIDE 0.9 % IV SOLN
INTRAVENOUS | Status: DC
  Administered 2015-01-05: 14:00:00 via INTRAVENOUS

## 2015-01-05 MED ORDER — ASPIRIN EC 81 MG PO TBEC: 81.0000 mg | DELAYED_RELEASE_TABLET | Freq: Every day | ORAL | Status: DC

## 2015-01-05 MED ORDER — SODIUM CHLORIDE 0.9 % WEIGHT BASED INFUSION
1.0000 mL/kg/h | INTRAVENOUS | Status: DC
  Administered 2015-01-06: 1 mL/kg/h via INTRAVENOUS

## 2015-01-05 MED ORDER — ALPRAZOLAM 0.25 MG PO TABS
0.2500 mg | ORAL_TABLET | Freq: Once | ORAL | Status: AC
  Administered 2015-01-05: 0.25 mg via ORAL
  Filled 2015-01-05: qty 1

## 2015-01-05 NOTE — H&P (Signed)
Patient ID: Tommy Malone MRN: [id number], DOB/AGE: [birth year]   Admit date: 01/05/2015   Primary Physician: Katy Apo, MD Primary Cardiologist: Dr Tenny Craw (last seen in 2014)  Pt. Profile:  49 year old male with history of tobacco abuse, untreated hyperlipidemia, lower extremity peripheral vassar disease and bilateral carotid artery disease presenting to the [facility] ED with unstable angina. He has ruled in for non-STEMI.  Problem List  Past Medical History  Diagnosis Date  . Acid reflux   . Depression   . Anxiety   . Peripheral vascular disease (HCC)   . Hemorrhoid     Past Surgical History  Procedure Laterality Date  . Elbow surgery Bilateral   . Kidney stone surgery      x2  . Aorta - bilateral femoral artery bypass graft N/A 01/07/2013    Procedure: AORTA BIFEMORAL BYPASS GRAFT;  Surgeon: Fransisco Hertz, MD;  Location: [facility] OR;  Service: Vascular;  Laterality: N/A;  . Abdominal aortagram N/A 12/18/2012    Procedure: ABDOMINAL AORTAGRAM;  Surgeon: Fransisco Hertz, MD;  Location: [facility] CATH LAB;  Service: Cardiovascular;  Laterality: N/A;     Allergies  Allergies  Allergen Reactions  . Simvastatin Nausea Only    Stomach aches    HPI  49 y/o male presenting with a NSTEMI. He was seen by Dr. Tenny Craw in Dec 2014. He was referred for preop risk evaluation, prior to undergoing bilateral femoral artery bypass grafting by Dr. Imogene Burn for severe PAD/ severe lifestyle limiting claudication. Myoview stress test was ordered as part of pre-surgical w/u. This was a normal study w/o ischemia. LV systolic function was normal with EF of 57%. The patient also has h/o bilateral carotid artery diease as well as evidence of left subclavian stenosis. Carotid dopplers 01/2013 showed 40% right ICA stenosis and 40-59% left ICA stenosis. His left subclavian stenosis was only commented on in report. No documentation of severity of disease.  Also has a history of tobacco abuse and untreated  hyperlipidemia.  He presents to the [facility] ED with a complaint of chest pain off and on for the last week. He initially thought it was acid reflux. He describes substernal chest burning with radiation to the left arm that has occurred with exertion. He also notes exertional dyspnea. This morning when he presented to work, he developed severe symptoms walking from the parking lot to his work building. This was also accompanied by malaise and profuse diaphoresis. He was very short of breath, prompting him to come to the emergency department. Symptoms resolved with sublingual nitroglycerin. Initial troponin in the ED is abnormal and 0.30. EKG shows normal sinus rhythm without any acute abnormalities. He is currently chest pain-free on IV heparin.    Home Medications  Prior to Admission medications   Medication Sig Start Date End Date Taking? Authorizing Provider  omeprazole (PRILOSEC) 20 MG capsule Take 20 mg by mouth daily.   Yes Historical Provider, MD    Family History  Family History  Problem Relation Age of Onset  . Heart disease Mother     NOT  before age 45  . Heart attack Mother   . Hyperlipidemia Mother   . Heart disease Maternal Grandmother   . Cancer Paternal Grandfather   . Heart disease Father     NOT  before age 18  . Heart attack Father   . Hyperlipidemia Father     Social History  Social History   Social History  . Marital Status: Legally Separated  Spouse Name: N/A  . Number of Children: N/A  . Years of Education: N/A   Occupational History  . Not on file.   Social History Main Topics  . Smoking status: Light Tobacco Smoker -- 1.00 packs/day for 30 years    Types: Cigarettes, E-cigarettes  . Smokeless tobacco: Never Used  . Alcohol Use: No     Comment: occasional  . Drug Use: No  . Sexual Activity: Not on file   Other Topics Concern  . Not on file   Social History Narrative     Review of Systems General:  No chills, fever, night sweats or weight  changes.  Cardiovascular:  No chest pain, dyspnea on exertion, edema, orthopnea, palpitations, paroxysmal nocturnal dyspnea. Dermatological: No rash, lesions/masses Respiratory: No cough, dyspnea Urologic: No hematuria, dysuria Abdominal:   No nausea, vomiting, diarrhea, bright red blood per rectum, melena, or hematemesis Neurologic:  No visual changes, wkns, changes in mental status. All other systems reviewed and are otherwise negative except as noted above.  Physical Exam  Blood pressure 113/90, pulse 85, temperature 97.6 F (36.4 C), temperature source Oral, resp. rate 18, height 5\' 9"  (1.753 m), weight 165 lb (74.844 kg), SpO2 96 %.  General: Pleasant, NAD Psych: Normal affect. Neuro: Alert and oriented X 3. Moves all extremities spontaneously. HEENT: Normal  Neck: Supple without  JVD. Faint bilateral bruits Lungs:  Resp regular and unlabored, CTA. Heart: RRR no s3, s4, or murmurs. Abdomen: Soft, non-tender, non-distended, BS + x 4.  Extremities: No clubbing, cyanosis or edema. DP/PT/Radials 2+ and equal bilaterally.  Labs  Troponin [facility](Point of Care Test)  Recent Labs  01/05/15 0841  TROPIPOC 0.11*    Recent Labs  01/05/15 0918  TROPONINI 0.30*   Lab Results  Component Value Date   WBC 9.6 01/05/2015   HGB 16.9 01/05/2015   HCT 50.0 01/05/2015   MCV 89.0 01/05/2015   PLT 143* 01/05/2015    Recent Labs Lab 01/05/15 0819  NA 136  K 4.3  CL 106  CO2 20*  BUN 10  CREATININE 1.17  CALCIUM 8.6*  GLUCOSE 99   Lab Results  Component Value Date   CHOL 158 01/08/2013   HDL 32* 01/08/2013   LDLCALC 111* 01/08/2013   TRIG 76 01/08/2013   No results found for: DDIMER   Radiology/Studies  Dg Chest 2 View  01/05/2015  CLINICAL DATA:  Shortness of breath. EXAM: CHEST  2 VIEW COMPARISON:  01/08/2013 FINDINGS: Minimal infiltrate in the right lower lobe cannot be excluded. No pleural effusion or pneumothorax noted. Heart size stable. No pulmonary venous  congestion. No acute bony abnormality. IMPRESSION: Minimal infiltrate right lower lobe cannot be excluded. Electronically Signed   By: Maisie Fushomas  Register   On: 01/05/2015 09:08    ECG  Normal sinus rhythm. No acute changes.  ASSESSMENT AND PLAN  Principal Problem:   NSTEMI (non-ST elevated myocardial infarction) (HCC) Active Problems:   Unstable angina (HCC)   HLD (hyperlipidemia)   Tobacco abuse   Bilateral carotid artery disease (HCC)    1. [place]/NSTEMI: Recent symptoms of exertional chest discomfort and exertional dyspnea are consistent with unstable angina. Initial cardiac enzyme is also abnormal at 0.30. He is currently chest pain-free on IV heparin. We'll admit to telemetry and will continue to cycle cardiac enzymes 3 to assess enzyme trend. We'll plan for left heart catheterization to define coronary anatomy +/- intervention. Renal function and vital signs are stable. Will initiate ASA, statin and low dose BB.  Check 2D echo.  2. Tobacco Abuse: smoking cessation advised.   3. Bilateral Carotid Artery Disease: Dopplers January 2015 showed or 2% right ICA as well as 40-59% left ICA stenosis, also with evidence of subclavian stenosis. He's been asymptomatic denying dizziness, syncope/near-syncope.  4. HLD: Patient currently not on any medications for cholesterol. His last documented lipid panel was 01/08/2013. LDL that time was abnormal 111 mg/dL. Given non-STEMI, we will place on high-dose Lipitor. Will check a fasting lipid panel in the a.m. as well as hepatic function test.   Signed, Carloyn Lahue, PA-C 01/05/2015, 10:59 AM

## 2015-01-05 NOTE — Progress Notes (Signed)
ANTICOAGULATION CONSULT NOTE - FOLLOW UP    HL = 0.22 (goal 0.3 - 0.7 units/mL) Heparin dosing weight = 74 kg   Assessment: 49 YOM continues on IV heparin for chest pain.  Heparin level sub-therapeutic; no bleeding reported.  No issue with infusion per RN.   Plan: - Increase heparin gtt to 1150 units/hr - Check 6 hr HL    Tommy Malone D. Laney Potashang, PharmD, BCPS 01/05/2015, 5:07 PM

## 2015-01-05 NOTE — ED Notes (Signed)
Meal Tray ordered.  
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
## 2015-01-05 NOTE — Progress Notes (Signed)
ANTICOAGULATION CONSULT NOTE - Initial Consult  Pharmacy Consult for heparin Indication: chest pain/ACS  Allergies  Allergen Reactions  . Simvastatin Nausea Only    Stomach aches    Patient Measurements: Height: 5\' 9"  (175.3 cm) Weight: 165 lb (74.844 kg) IBW/kg (Calculated) : 70.7 Heparin Dosing Weight: 74kg  Vital Signs: Temp: 97.6 F (36.4 C) (12/07 0812) Temp Source: Oral (12/07 0812) BP: 113/90 mmHg (12/07 0812) Pulse Rate: 85 (12/07 0812)  Labs:  Recent Labs  01/05/15 0819 01/05/15 0918  HGB 16.9  --   HCT 50.0  --   PLT 143*  --   APTT  --  28  LABPROT  --  12.8  INR  --  0.94  CREATININE 1.17  --     Estimated Creatinine Clearance: 77.2 mL/min (by C-G formula based on Cr of 1.17).   Assessment: [age] YOM here with CP radiating to L chest x1 week. trops up at 0.11. Baseline hgb 16.9, plts 143. No bleeding noted.  On aspirin PTA, but no anticoagulation.  Goal of Therapy:  Heparin level 0.3-0.7 units/ml Monitor platelets by anticoagulation protocol: Yes   Plan:  -heparin bolus with 4000 units IV x1, then start infusion at 1000 units/hr -HL in 6h -daily HL and CBC -f/u cards plans  Merritt Kibby D. Lakesia Dahle, PharmD, BCPS Clinical Pharmacist Pager: [phone] 01/05/2015 10:07 AM

## 2015-01-05 NOTE — ED Notes (Signed)
Pt c/o Radiating mid CP radiating to L chest onset x 1 wk, pt c/o SOB, pt denies n/v/d, pt reports nausea this morning when he had an episode of SOB while walking at work, hx of GERD, A&O x4

## 2015-01-05 NOTE — ED Provider Notes (Signed)
CSN: [id number]     Arrival date & time 01/05/15  0807 History   First MD Initiated Contact with Patient 01/05/15 0912     Chief Complaint  Patient presents with  . Chest Pain     Patient is a 49 y.o. male presenting with chest pain.  Chest Pain Pain location:  Substernal area Pain quality: burning   Pain radiates to:  L shoulder Pain radiates to the back: no   Pain severity:  Severe Onset quality:  Sudden Timing:  Constant Progression:  Improving Chronicity:  New Relieved by:  Rest Worsened by:  Exertion Associated symptoms: cough, diaphoresis, dizziness, nausea and shortness of breath   Associated symptoms: no abdominal pain, no fever, no numbness, no syncope, not vomiting and no weakness   Risk factors: male sex and smoking   Risk factors: no prior DVT/PE   pt reports this morning he had episode of chest burning/pain that is still present, SOB and diaphoresis while walking from car into work.  He reports he had similar episodes last week - he would have CP with exertion and improved with rest, but today's episode is the worst Denies known h/o CAD He is a smoker He reports h/o vascular disease  Soc hx - smoker fam hx - CAD in multiple family members  Past Medical History  Diagnosis Date  . Acid reflux   . Depression   . Anxiety   . Peripheral vascular disease (HCC)   . Hemorrhoid    Past Surgical History  Procedure Laterality Date  . Elbow surgery Bilateral   . Kidney stone surgery      x2  . Aorta - bilateral femoral artery bypass graft N/A 01/07/2013    Procedure: AORTA BIFEMORAL BYPASS GRAFT;  Surgeon: Fransisco Hertz, MD;  Location: [facility] OR;  Service: Vascular;  Laterality: N/A;  . Abdominal aortagram N/A 12/18/2012    Procedure: ABDOMINAL AORTAGRAM;  Surgeon: Fransisco Hertz, MD;  Location: [facility] CATH LAB;  Service: Cardiovascular;  Laterality: N/A;   Family History  Problem Relation Age of Onset  . Heart disease Mother     NOT  before age 56  . Heart attack Mother    . Hyperlipidemia Mother   . Heart disease Maternal Grandmother   . Cancer Paternal Grandfather   . Heart disease Father     NOT  before age 65  . Heart attack Father   . Hyperlipidemia Father    Social History  Substance Use Topics  . Smoking status: Light Tobacco Smoker -- 1.00 packs/day for 30 years    Types: Cigarettes, E-cigarettes  . Smokeless tobacco: Never Used  . Alcohol Use: No     Comment: occasional    Review of Systems  Constitutional: Positive for diaphoresis. Negative for fever.  Respiratory: Positive for cough and shortness of breath.   Cardiovascular: Positive for chest pain. Negative for syncope.  Gastrointestinal: Positive for nausea. Negative for vomiting, abdominal pain and blood in stool.  Neurological: Positive for dizziness. Negative for syncope, weakness and numbness.  All other systems reviewed and are negative.     Allergies  Simvastatin  Home Medications   Prior to Admission medications   Medication Sig Start Date End Date Taking? Authorizing Provider  amoxicillin (AMOXIL) 875 MG tablet  12/08/12   Historical Provider, MD  aspirin 81 MG tablet Take 81 mg by mouth daily.    Historical Provider, MD  atorvastatin (LIPITOR) 10 MG tablet Take 1 tablet (10 mg total) by mouth  daily. 01/14/13   Samantha J Rhyne, PA-C  CIALIS 20 MG tablet Take 20 mg by mouth daily as needed for erectile dysfunction.  12/12/12   Historical Provider, MD  clonazePAM (KLONOPIN) 0.5 MG tablet Take 0.5 mg by mouth 2 (two) times daily as needed for anxiety.    Historical Provider, MD  HYDROcodone-acetaminophen (NORCO/VICODIN) 5-325 MG per tablet Take 1-2 tablets by mouth every 6 (six) hours as needed. 07/11/14   Heather Laisure, PA-C  ibuprofen (ADVIL,MOTRIN) 800 MG tablet Take 1 tablet (800 mg total) by mouth 3 (three) times daily. 07/11/14   Heather Laisure, PA-C  omeprazole (PRILOSEC) 20 MG capsule Take 20 mg by mouth daily.    Historical Provider, MD  oxyCODONE (ROXICODONE) 5  MG immediate release tablet Take 1 tablet (5 mg total) by mouth every 6 (six) hours as needed for severe pain. 01/20/13   Carma Lair Nickel, NP  sertraline (ZOLOFT) 100 MG tablet Take 100 mg by mouth daily.    Historical Provider, MD   BP 113/90 mmHg  Pulse 85  Temp(Src) 97.6 F (36.4 C) (Oral)  Resp 18  Ht  (1.753 m)  Wt 74.844 kg  BMI 24.36 kg/m2  SpO2 96% Physical Exam CONSTITUTIONAL: Well developed/well nourished HEAD: Normocephalic/atraumatic EYES: EOMI/PERRL ENMT: Mucous membranes moist NECK: supple no meningeal signs SPINE/BACK:entire spine nontender CV: S1/S2 noted, no murmurs/rubs/gallops noted LUNGS: Lungs are clear to auscultation bilaterally, no apparent distress ABDOMEN: soft, nontender, no rebound or guarding, bowel sounds noted throughout abdomen GU:no cva tenderness NEURO: Pt is awake/alert/appropriate, moves all extremitiesx4.  No facial droop.   EXTREMITIES: pulses normal/equal, full ROM SKIN: warm, color normal PSYCH: no abnormalities of mood noted, alert and oriented to situation  ED Course  Procedures  CRITICAL CARE Performed by: Joya Gaskins Total critical care time: 35 minutes Critical care time was exclusive of separately billable procedures and treating other patients. Critical care was necessary to treat or prevent imminent or life-threatening deterioration. Critical care was time spent personally by me on the following activities: development of treatment plan with patient and/or surrogate as well as nursing, discussions with consultants, evaluation of patient's response to treatment, examination of patient, obtaining history from patient or surrogate, ordering and performing treatments and interventions, ordering and review of laboratory studies, ordering and review of radiographic studies, pulse oximetry and re-evaluation of patient's condition. PATIENT WITH NON-STEMI, TROPONIN ELEVATION AND NEEDED HEPARIN AND NITROGLYCERIN  Medications   nitroGLYCERIN (NITROSTAT) SL tablet 0.4 mg (0.4 mg Sublingual Given 01/05/15 0943)  heparin ADULT infusion 100 units/mL (25000 units/250 mL) (1,000 Units/hr Intravenous New Bag/Given 01/05/15 1052)  aspirin chewable tablet 324 mg (324 mg Oral Given 01/05/15 0927)  heparin bolus via infusion 4,000 Units (4,000 Units Intravenous Given 01/05/15 1052)    Labs Review Labs Reviewed  BASIC METABOLIC PANEL - Abnormal; Notable for the following:    CO2 20 (*)    Calcium 8.6 (*)    All other components within normal limits  CBC - Abnormal; Notable for the following:    Platelets 143 (*)    All other components within normal limits  TROPONIN I - Abnormal; Notable for the following:    Troponin I 0.30 (*)    All other components within normal limits  I-STAT TROPOININ, ED - Abnormal; Notable for the following:    Troponin i, poc 0.11 (*)    All other components within normal limits  PROTIME-INR  APTT  HEPARIN LEVEL (UNFRACTIONATED)    Imaging Review Dg Chest 2  View  01/05/2015  CLINICAL DATA:  Shortness of breath. EXAM: CHEST  2 VIEW COMPARISON:  01/08/2013 FINDINGS: Minimal infiltrate in the right lower lobe cannot be excluded. No pleural effusion or pneumothorax noted. Heart size stable. No pulmonary venous congestion. No acute bony abnormality. IMPRESSION: Minimal infiltrate right lower lobe cannot be excluded. Electronically Signed   By: Maisie Fushomas  Register   On: 01/05/2015 09:08   I have personally reviewed and evaluated these images and lab results as part of my medical decision-making.   EKG Interpretation   Date/Time:  Wednesday January 05 2015 08:11:33 EST Ventricular Rate:  93 PR Interval:  134 QRS Duration: 80 QT Interval:  336 QTC Calculation: 417 R Axis:   26 Text Interpretation:  Normal sinus rhythm Normal ECG Confirmed by Bebe ShaggyWICKLINE   MD, Dorinda HillNALD ([id number]) on 01/05/2015 9:12:49 AM     9:47 AM Pt with significant risk factors (family history, smoker, PAD) with concerning story  for unstable angina now with elevated troponin ASA/NTG/Heparin ordered D/w [place] cardiology for admission.  Pt may need cardiac cath today 11:31 AM Pt is CP free at this time Awaiting admission Spoke to cardiology will see patient soon Pt updated on plan MDM   Final diagnoses:  Non-STEMI (non-ST elevated myocardial infarction) [facility](HCC)    Nursing notes including past medical history and social history reviewed and considered in documentation xrays/imaging reviewed by myself and considered during evaluation Labs/vital reviewed myself and considered during evaluation Previous records reviewed and considered     Zadie Rhineonald Brae Gartman, MD 01/05/15 1132

## 2015-01-06 ENCOUNTER — Other Ambulatory Visit: Payer: Self-pay

## 2015-01-06 ENCOUNTER — Other Ambulatory Visit (HOSPITAL_COMMUNITY): Payer: PRIVATE HEALTH INSURANCE

## 2015-01-06 ENCOUNTER — Encounter (HOSPITAL_COMMUNITY)
Admission: EM | Disposition: A | Discharge status: Home / Self Care | Payer: Self-pay | Source: Home / Self Care | Attending: Internal Medicine

## 2015-01-06 ENCOUNTER — Encounter (HOSPITAL_COMMUNITY): Payer: Self-pay | Admitting: Cardiovascular Disease

## 2015-01-06 DIAGNOSIS — I2510 Atherosclerotic heart disease of native coronary artery without angina pectoris: Secondary | ICD-10-CM | POA: Insufficient documentation

## 2015-01-06 DIAGNOSIS — I200 Unstable angina: Secondary | ICD-10-CM | POA: Diagnosis not present

## 2015-01-06 DIAGNOSIS — I25110 Atherosclerotic heart disease of native coronary artery with unstable angina pectoris: Secondary | ICD-10-CM | POA: Diagnosis not present

## 2015-01-06 DIAGNOSIS — Z720 Tobacco use: Secondary | ICD-10-CM | POA: Diagnosis not present

## 2015-01-06 DIAGNOSIS — I214 Non-ST elevation (NSTEMI) myocardial infarction: Secondary | ICD-10-CM | POA: Diagnosis not present

## 2015-01-06 DIAGNOSIS — Z9861 Coronary angioplasty status: Secondary | ICD-10-CM

## 2015-01-06 DIAGNOSIS — I779 Disorder of arteries and arterioles, unspecified: Secondary | ICD-10-CM | POA: Diagnosis not present

## 2015-01-06 HISTORY — PX: CARDIAC CATHETERIZATION: SHX172

## 2015-01-06 HISTORY — DX: Atherosclerotic heart disease of native coronary artery without angina pectoris: I25.10

## 2015-01-06 LAB — COMPREHENSIVE METABOLIC PANEL
ALT: 15 U/L — ABNORMAL LOW (ref 17–63)
ANION GAP: 5 (ref 5–15)
AST: 23 U/L (ref 15–41)
Albumin: 2.8 g/dL — ABNORMAL LOW (ref 3.5–5.0)
Alkaline Phosphatase: 54 U/L (ref 38–126)
BUN: 7 mg/dL (ref 6–20)
CO2: 25 mmol/L (ref 22–32)
CREATININE: 1.26 mg/dL — AB (ref 0.61–1.24)
Calcium: 8.2 mg/dL — ABNORMAL LOW (ref 8.9–10.3)
Chloride: 106 mmol/L (ref 101–111)
GFR calc Af Amer: >60 mL/min (ref >60)
Glucose, Bld: 103 mg/dL — ABNORMAL HIGH (ref 65–99)
POTASSIUM: 4.3 mmol/L (ref 3.5–5.1)
Sodium: 136 mmol/L (ref 135–145)
TOTAL PROTEIN: 4.7 g/dL — AB (ref 6.5–8.1)
Total Bilirubin: 0.5 mg/dL (ref 0.3–1.2)

## 2015-01-06 LAB — LIPID PANEL
CHOL/HDL RATIO: 4.8 ratio
Cholesterol: 153 mg/dL (ref 0–200)
HDL: 32 mg/dL — AB (ref >40)
LDL CALC: 99 mg/dL (ref 0–99)
Triglycerides: 110 mg/dL (ref <150)
VLDL: 22 mg/dL (ref 0–40)

## 2015-01-06 LAB — HEMOGLOBIN A1C
Hgb A1c MFr Bld: 5.6 % (ref 4.8–5.6)
Mean Plasma Glucose: 114 mg/dL

## 2015-01-06 LAB — CBC
HEMATOCRIT: 47.6 % (ref 39.0–52.0)
HEMOGLOBIN: 15.7 g/dL (ref 13.0–17.0)
MCH: 29.8 pg (ref 26.0–34.0)
MCHC: 33 g/dL (ref 30.0–36.0)
MCV: 90.5 fL (ref 78.0–100.0)
Platelets: 139 10*3/uL — ABNORMAL LOW (ref 150–400)
RBC: 5.26 MIL/uL (ref 4.22–5.81)
RDW: 13.5 % (ref 11.5–15.5)
WBC: 7.4 10*3/uL (ref 4.0–10.5)

## 2015-01-06 LAB — HEPARIN LEVEL (UNFRACTIONATED)
HEPARIN UNFRACTIONATED: 0.2 [IU]/mL — AB (ref 0.30–0.70)
Heparin Unfractionated: 0.38 IU/mL (ref 0.30–0.70)

## 2015-01-06 LAB — TROPONIN I
TROPONIN I: 0.59 ng/mL — AB (ref <0.031)
Troponin I: 1.03 ng/mL (ref <0.031)

## 2015-01-06 LAB — PROTIME-INR
INR: 0.99 (ref 0.00–1.49)
PROTHROMBIN TIME: 13.3 s (ref 11.6–15.2)

## 2015-01-06 LAB — POCT ACTIVATED CLOTTING TIME: Activated Clotting Time: 317 seconds

## 2015-01-06 SURGERY — LEFT HEART CATH AND CORONARY ANGIOGRAPHY
Anesthesia: LOCAL

## 2015-01-06 MED ORDER — HEPARIN SODIUM (PORCINE) 1000 UNIT/ML IJ SOLN
INTRAMUSCULAR | Status: DC | PRN
  Administered 2015-01-06: 4000 [IU] via INTRAVENOUS
  Administered 2015-01-06: 6000 [IU] via INTRAVENOUS

## 2015-01-06 MED ORDER — MIDAZOLAM HCL 2 MG/2ML IJ SOLN
INTRAMUSCULAR | Status: AC
  Filled 2015-01-06: qty 2

## 2015-01-06 MED ORDER — TICAGRELOR 90 MG PO TABS
ORAL_TABLET | ORAL | Status: AC
  Filled 2015-01-06: qty 1

## 2015-01-06 MED ORDER — NICOTINE 14 MG/24HR TD PT24
14.0000 mg | MEDICATED_PATCH | Freq: Every day | TRANSDERMAL | Status: DC
  Administered 2015-01-06: 18:00:00 14 mg via TRANSDERMAL
  Filled 2015-01-06: qty 1

## 2015-01-06 MED ORDER — HEART ATTACK BOUNCING BOOK
Freq: Once | Status: AC
  Administered 2015-01-06: 21:00:00
  Filled 2015-01-06: qty 1

## 2015-01-06 MED ORDER — HEPARIN (PORCINE) IN NACL 2-0.9 UNIT/ML-% IJ SOLN
INTRAMUSCULAR | Status: DC | PRN
  Administered 2015-01-06: 12:00:00 via INTRA_ARTERIAL

## 2015-01-06 MED ORDER — LIDOCAINE HCL (PF) 1 % IJ SOLN
INTRAMUSCULAR | Status: AC
  Filled 2015-01-06: qty 30

## 2015-01-06 MED ORDER — VERAPAMIL HCL 2.5 MG/ML IV SOLN
INTRAVENOUS | Status: AC
  Filled 2015-01-06: qty 2

## 2015-01-06 MED ORDER — SODIUM CHLORIDE 0.9 % IJ SOLN: 3.0000 mL | Freq: Two times a day (BID) | INTRAMUSCULAR | Status: DC

## 2015-01-06 MED ORDER — HEPARIN (PORCINE) IN NACL 2-0.9 UNIT/ML-% IJ SOLN
INTRAMUSCULAR | Status: AC
  Filled 2015-01-06: qty 1500

## 2015-01-06 MED ORDER — SODIUM CHLORIDE 0.9 % IJ SOLN: 3.0000 mL | INTRAMUSCULAR | Status: DC | PRN

## 2015-01-06 MED ORDER — TICAGRELOR 90 MG PO TABS
90.0000 mg | ORAL_TABLET | Freq: Two times a day (BID) | ORAL | Status: DC
  Administered 2015-01-06: 90 mg via ORAL
  Filled 2015-01-06 (×3): qty 1

## 2015-01-06 MED ORDER — IOHEXOL 350 MG/ML SOLN
INTRAVENOUS | Status: DC | PRN
  Administered 2015-01-06: 180 mL via INTRA_ARTERIAL

## 2015-01-06 MED ORDER — NITROGLYCERIN 1 MG/10 ML FOR IR/CATH LAB
INTRA_ARTERIAL | Status: AC
  Filled 2015-01-06: qty 10

## 2015-01-06 MED ORDER — NITROGLYCERIN 1 MG/10 ML FOR IR/CATH LAB
INTRA_ARTERIAL | Status: DC | PRN
  Administered 2015-01-06: 12:00:00

## 2015-01-06 MED ORDER — MIDAZOLAM HCL 2 MG/2ML IJ SOLN
INTRAMUSCULAR | Status: DC | PRN
  Administered 2015-01-06: 2 mg via INTRAVENOUS

## 2015-01-06 MED ORDER — LIDOCAINE HCL (PF) 1 % IJ SOLN
INTRAMUSCULAR | Status: DC | PRN
  Administered 2015-01-06: 5 mL

## 2015-01-06 MED ORDER — ANGIOPLASTY BOOK
Freq: Once | Status: AC
  Administered 2015-01-06: 21:00:00
  Filled 2015-01-06: qty 1

## 2015-01-06 MED ORDER — TICAGRELOR 90 MG PO TABS
ORAL_TABLET | ORAL | Status: DC | PRN
  Administered 2015-01-06: 180 mg via ORAL

## 2015-01-06 MED ORDER — NITROGLYCERIN 1 MG/10 ML FOR IR/CATH LAB
INTRA_ARTERIAL | Status: DC | PRN
  Administered 2015-01-06: 200 mL

## 2015-01-06 MED ORDER — FENTANYL CITRATE (PF) 100 MCG/2ML IJ SOLN
INTRAMUSCULAR | Status: DC | PRN
  Administered 2015-01-06: 25 ug via INTRAVENOUS

## 2015-01-06 MED ORDER — SODIUM CHLORIDE 0.9 % IV SOLN: 250.0000 mL | INTRAVENOUS | Status: DC | PRN

## 2015-01-06 MED ORDER — FENTANYL CITRATE (PF) 100 MCG/2ML IJ SOLN
INTRAMUSCULAR | Status: AC
  Filled 2015-01-06: qty 2

## 2015-01-06 MED ORDER — SODIUM CHLORIDE 0.9 % IV SOLN
INTRAVENOUS | Status: AC
  Administered 2015-01-06: 13:00:00 via INTRAVENOUS

## 2015-01-06 MED ORDER — HEPARIN BOLUS VIA INFUSION
2000.0000 [IU] | Freq: Once | INTRAVENOUS | Status: AC
  Administered 2015-01-06: 2000 [IU] via INTRAVENOUS
  Filled 2015-01-06: qty 2000

## 2015-01-06 SURGICAL SUPPLY — 21 items
BALLN EMERGE MR 2.5X15 (BALLOONS) ×2
BALLN ~~LOC~~ EUPHORA RX 3.5X15 (BALLOONS) ×2
BALLOON EMERGE MR 2.5X15 (BALLOONS) ×1 IMPLANT
BALLOON ~~LOC~~ EUPHORA RX 3.5X15 (BALLOONS) ×1 IMPLANT
CATH INFINITI 5 FR JL3.5 (CATHETERS) ×2 IMPLANT
CATH INFINITI 5FR ANG PIGTAIL (CATHETERS) ×2 IMPLANT
CATH INFINITI JR4 5F (CATHETERS) ×2 IMPLANT
CATH VISTA GUIDE 6FR XBLAD3.5 (CATHETERS) ×2 IMPLANT
COVER PRB 48X5XTLSCP FOLD TPE (BAG) ×1 IMPLANT
COVER PROBE 5X48 (BAG) ×1
DEVICE RAD COMP TR BAND LRG (VASCULAR PRODUCTS) ×2 IMPLANT
GLIDESHEATH SLEND SS 6F .021 (SHEATH) ×2 IMPLANT
KIT ENCORE 26 ADVANTAGE (KITS) ×2 IMPLANT
KIT HEART LEFT (KITS) ×2 IMPLANT
PACK CARDIAC CATHETERIZATION (CUSTOM PROCEDURE TRAY) ×2 IMPLANT
STENT XIENCE ALPINE RX 3.5X33 (Permanent Stent) ×2 IMPLANT
SYR MEDRAD MARK V 150ML (SYRINGE) ×2 IMPLANT
TRANSDUCER W/STOPCOCK (MISCELLANEOUS) ×2 IMPLANT
TUBING CIL FLEX 10 FLL-RA (TUBING) ×2 IMPLANT
WIRE COUGAR XT STRL 190CM (WIRE) ×2 IMPLANT
WIRE SAFE-T 1.5MM-J .035X260CM (WIRE) ×2 IMPLANT

## 2015-01-06 NOTE — Progress Notes (Signed)
ANTICOAGULATION CONSULT NOTE - Follow Up Consult  Pharmacy Consult for heparin Indication: chest pain/ACS  Allergies  Allergen Reactions  . Simvastatin Nausea Only    Stomach aches    Patient Measurements: Height: 5\' 9"  (175.3 cm) Weight: 165 lb (74.844 kg) IBW/kg (Calculated) : 70.7  Vital Signs: Temp: 99.1 F (37.3 C) (12/08 0623) Temp Source: Oral (12/08 0623) BP: 107/72 mmHg (12/08 0623) Pulse Rate: 79 (12/08 0623)  Labs:  Recent Labs  01/05/15 0819  01/05/15 0918 01/05/15 1633 01/06/15 0024 01/06/15 0432 01/06/15 0818  HGB 16.9  --   --   --   --  15.7  --   HCT 50.0  --   --   --   --  47.6  --   PLT 143*  --   --   --   --  139*  --   APTT  --   --  28  --   --   --   --   LABPROT  --   --  12.8  --   --  13.3  --   INR  --   --  0.94  --   --  0.99  --   HEPARINUNFRC  --   --   --  0.22* 0.20*  --  0.38  CREATININE 1.17  --   --   --   --  1.26*  --   TROPONINI  --   < > 0.30* 1.06* 0.59* 1.03*  --   < > = values in this interval not displayed.  Estimated Creatinine Clearance: 71.7 mL/min (by C-G formula based on Cr of 1.26).   Medications:  Scheduled:  . [START ON 01/07/2015] aspirin EC  81 mg Oral Daily  . atorvastatin  80 mg Oral q1800  . Influenza vac split quadrivalent PF  0.5 mL Intramuscular Tomorrow-1000  . metoprolol tartrate  12.5 mg Oral BID  . pneumococcal 23 valent vaccine  0.5 mL Intramuscular Tomorrow-1000  . sodium chloride  3 mL Intravenous Q12H   Infusions:  . sodium chloride 100 mL/hr at 01/05/15 1405  . sodium chloride 1 mL/kg/hr (01/06/15 0700)  . heparin 1,300 Units/hr (01/06/15 0117)  . nitroGLYCERIN 10 mcg/min (01/05/15 1432)    Assessment: 49 yo male with CP on heparin and heparin level is at goal (HL= 0.38).  Noted plans for cath today.  Goal of Therapy:  Heparin level 0.3-0.7 units/ml Monitor platelets by anticoagulation protocol: Yes   Plan:  -No heparin changes needed -Will follow plans post cath  Harland Germanndrew  Makaveli Hoard, Pharm D 01/06/2015 9:56 AM

## 2015-01-06 NOTE — Progress Notes (Signed)
TR BAND REMOVAL  LOCATION:  right radial  DEFLATED PER PROTOCOL:  Yes.    TIME BAND OFF / DRESSING APPLIED:   1700   SITE UPON ARRIVAL:   Level 0  SITE AFTER BAND REMOVAL:  Level 0  CIRCULATION SENSATION AND MOVEMENT:  Within Normal Limits  Yes.    COMMENTS:    

## 2015-01-06 NOTE — Progress Notes (Signed)
       Patient Name: Tommy Malone Date of Encounter: [address] [place]/08/2014    SUBJECTIVE: The chest discomfort, headache, dyspnea, or other CV concern. Not much sleep over night.  TELEMETRY:  Normal sinus rhythm and no reported arrhythmia's. Filed Vitals:   01/06/15 0400 01/06/15 0623 01/06/15 1011 01/06/15 1055  BP:  107/72 108/75   Pulse:  79 80   Temp: 98.3 F (36.8 C) 99.1 F (37.3 C)    TempSrc: Oral Oral    Resp:  18    Height:      Weight:      SpO2:  97%  99%   No intake or output data in the 24 hours ending 01/06/15 1101 LABS: Basic Metabolic Panel:  Recent Labs  40/98/1110/08/14 0819 01/06/15 0432  NA 136 136  K 4.3 4.3  CL 106 106  CO2 20* 25  GLUCOSE 99 103*  BUN 10 7  CREATININE 1.17 1.26*  CALCIUM 8.6* 8.2*   CBC:  Recent Labs  01/05/15 0819 01/06/15 0432  WBC 9.6 7.4  HGB 16.9 15.7  HCT 50.0 47.6  MCV 89.0 90.5  PLT 143* 139*   Cardiac Enzymes:  Recent Labs  01/05/15 1633 01/06/15 0024 01/06/15 0432  TROPONINI 1.06* 0.59* 1.03*   BNP: Invalid input(s): POCBNP Hemoglobin A1C:  Recent Labs  01/05/15 1633  HGBA1C 5.6   Fasting Lipid Panel:  Recent Labs  01/06/15 0432  CHOL 153  HDL 32*  LDLCALC 99  TRIG [id number]  CHOLHDL 4.8    Radiology/Studies:  Chest x-ray:Minimal infiltrate right lower lobe cannot be excluded.   Physical Exam: Blood pressure 108/75, pulse 80, temperature 99.1 F (37.3 C), temperature source Oral, resp. rate 18, height 5\' 9"  (1.753 m), weight 165 lb (74.844 kg), SpO2 99 %. Weight change:   Wt Readings from Last 3 Encounters:  01/05/15 165 lb (74.844 kg)  07/11/14 159 lb 8 oz (72.349 kg)  08/14/13 170 lb (77.111 kg)    No gallop, rub, or murmur  ASSESSMENT:  1. Non-ST elevation myocardial infarction. Currently stable and pain-free. 2. Hyperlipidemia with LDL 99 but has received statin 3. Normal hemoglobin A1c 4. Tobacco abuse 5.  Plan:  Continue IV hydration Coronary angiography today Risk factor  modification including smoking cessation and lipid therapy.  Selinda EonSigned, SMITH III,HENRY W 01/06/2015, 11:01 AM

## 2015-01-06 NOTE — Interval H&P Note (Signed)
History and Physical Interval Note:  01/06/2015 11:01 AM  Tommy Malone  has presented today for cardiac cath with the diagnosis of NSTEMI. The various methods of treatment have been discussed with the patient and family. After consideration of risks, benefits and other options for treatment, the patient has consented to  Procedure(s): Left Heart Cath and Coronary Angiography (N/A) as a surgical intervention .  The patient's history has been reviewed, patient examined, no change in status, stable for surgery.  I have reviewed the patient's chart and labs.  Questions were answered to the patient's satisfaction.    Cath Lab Visit (complete for each Cath Lab visit)  Clinical Evaluation Leading to the Procedure:   ACS: Yes.    Non-ACS:    Anginal Classification: CCS IV  Anti-ischemic medical therapy: No Therapy  Non-Invasive Test Results: No non-invasive testing performed  Prior CABG: No previous CABG         MCALHANY,CHRISTOPHER

## 2015-01-06 NOTE — Progress Notes (Signed)
ANTICOAGULATION CONSULT NOTE - Follow Up Consult  Pharmacy Consult for heparin Indication: USAP/NSTEMI   Labs:  Recent Labs  01/05/15 0819 01/05/15 0918 01/05/15 1633 01/06/15 0024  HGB 16.9  --   --   --   HCT 50.0  --   --   --   PLT 143*  --   --   --   APTT  --  28  --   --   LABPROT  --  12.8  --   --   INR  --  0.94  --   --   HEPARINUNFRC  --   --  0.22* 0.20*  CREATININE 1.17  --   --   --   TROPONINI  --  0.30* 1.06* 0.59*     Assessment: 49yo male remains subtherapeutic on heparin with lower level despite rate increase.  Goal of Therapy:  Heparin level 0.3-0.7 units/ml   Plan:  Will rebolus with heparin 2000 units x1 and increase gtt by 2 units/kg/hr to 1300 units/hr and check level in 6hr.  Vernard GamblesVeronda Verania Salberg, PharmD, BCPS  01/06/2015,1:13 AM

## 2015-01-07 ENCOUNTER — Inpatient Hospital Stay (HOSPITAL_COMMUNITY): Payer: PRIVATE HEALTH INSURANCE

## 2015-01-07 DIAGNOSIS — I214 Non-ST elevation (NSTEMI) myocardial infarction: Secondary | ICD-10-CM | POA: Diagnosis not present

## 2015-01-07 DIAGNOSIS — R079 Chest pain, unspecified: Secondary | ICD-10-CM | POA: Diagnosis not present

## 2015-01-07 DIAGNOSIS — Z720 Tobacco use: Secondary | ICD-10-CM | POA: Diagnosis not present

## 2015-01-07 DIAGNOSIS — I25110 Atherosclerotic heart disease of native coronary artery with unstable angina pectoris: Secondary | ICD-10-CM

## 2015-01-07 DIAGNOSIS — I779 Disorder of arteries and arterioles, unspecified: Secondary | ICD-10-CM | POA: Diagnosis not present

## 2015-01-07 LAB — CBC
HEMATOCRIT: 51.8 % (ref 39.0–52.0)
Hemoglobin: 17.7 g/dL — ABNORMAL HIGH (ref 13.0–17.0)
MCH: 30.5 pg (ref 26.0–34.0)
MCHC: 34.2 g/dL (ref 30.0–36.0)
MCV: 89.2 fL (ref 78.0–100.0)
PLATELETS: 163 10*3/uL (ref 150–400)
RBC: 5.81 MIL/uL (ref 4.22–5.81)
RDW: 13.5 % (ref 11.5–15.5)
WBC: 6.7 10*3/uL (ref 4.0–10.5)

## 2015-01-07 LAB — BASIC METABOLIC PANEL
Anion gap: 7 (ref 5–15)
BUN: 9 mg/dL (ref 6–20)
CO2: 25 mmol/L (ref 22–32)
Calcium: 8.6 mg/dL — ABNORMAL LOW (ref 8.9–10.3)
Chloride: 105 mmol/L (ref 101–111)
Creatinine, Ser: 1.16 mg/dL (ref 0.61–1.24)
Glucose, Bld: 96 mg/dL (ref 65–99)
Potassium: 3.8 mmol/L (ref 3.5–5.1)
SODIUM: 137 mmol/L (ref 135–145)

## 2015-01-07 MED ORDER — VARENICLINE TARTRATE 0.5 MG X 11 & 1 MG X 42 PO MISC: ORAL | Status: DC

## 2015-01-07 MED ORDER — METOPROLOL TARTRATE 25 MG PO TABS: 12.5000 mg | ORAL_TABLET | Freq: Two times a day (BID) | ORAL | Status: DC

## 2015-01-07 MED ORDER — ATORVASTATIN CALCIUM 80 MG PO TABS: 80.0000 mg | ORAL_TABLET | Freq: Every day | ORAL | Status: DC

## 2015-01-07 MED ORDER — NITROGLYCERIN 0.4 MG SL SUBL: 0.4000 mg | SUBLINGUAL_TABLET | SUBLINGUAL | Status: DC | PRN

## 2015-01-07 NOTE — Discharge Summary (Signed)
Physician Discharge Summary  Patient ID: Tommy Malone MRN: [id number] DOB/AGE: 1965/12/21 49 y.o.   Primary Cardiologist: Dr. Tenny Craw  Admit date: 01/05/2015 Discharge date: 01/07/2015  Admission Diagnoses: NSTEMI  Discharge Diagnoses:  Principal Problem:   NSTEMI (non-ST elevated myocardial infarction) [facility]) Active Problems:   Unstable angina (HCC)   HLD (hyperlipidemia)   Tobacco abuse   Bilateral carotid artery disease (HCC)   Coronary artery disease involving native coronary artery of native heart with unstable angina pectoris [facility])   Discharged Condition: stable  HPI: 49 y/o male presenting with a NSTEMI. He was seen by Dr. Tenny Craw in Dec 2014. He was referred for preop risk evaluation, prior to undergoing bilateral femoral artery bypass grafting by Dr. Imogene Burn for severe PAD/ severe lifestyle limiting claudication. Myoview stress test was ordered as part of pre-surgical w/u. This was a normal study w/o ischemia. LV systolic function was normal with EF of 57%. The patient also has h/o bilateral carotid artery diease as well as evidence of left subclavian stenosis. Carotid dopplers 01/2013 showed 40% right ICA stenosis and 40-59% left ICA stenosis. His left subclavian stenosis was only commented on in report. No documentation of severity of disease. Also has a history of tobacco abuse and untreated hyperlipidemia.  He presents to the [facility] ED on 01/05/15 with a complaint of chest pain off and on for the last week. He initially thought it was acid reflux. He described substernal chest burning with radiation to the left arm that occurred with exertion. He also noted exertional dyspnea. 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 This was also accompanied by malaise and profuse diaphoresis. He was very short of breath, prompting him to come to the emergency department. Symptoms resolved with sublingual nitroglycerin.  Initial troponin in the ED was abnormal and 0.30. EKG showed normal sinus rhythm without any acute abnormalities.      Hospital Course:  Patient was seen in ED by Dr. Katrinka Blazing and admitted for NSTEMI. He was placed on IV heparin and CP resolved. Troponins were cycled and peaked at 1.06. LHC performed by Dr. Clifton James on 01/06/15 showed severe 99% stenosis of the proximal LAD and mid LAD, successfully treated with PCI + DES. There was residual mid 50% and 70% distal RCA stenosis for which medical therapy was elected. There was mild segmental LV systolic dysfunction. EF was estimated to be 45-50%. He tolerated the procedure well and left the cath lab in stable condition. He had no recurrent CP and no post cath complications. The patient agreed to be enrolled in the [place], with treatment with ASA and Brilinta. He was also placed on high dose Lipitor and metoprolol. Chantix was prescribed for smoking cessation. PRN SL NTG also ordered on discharge. He was last seen and examined by Dr. Katrinka Blazing who determined he was stable for discharge home. Post hospital f/u has been arranged for 01/17/15. He was provided study drugs and medication instructions by the research team prior to discharge.     Consults: None  Significant Diagnostic Studies:  Procedures    Coronary Stent Intervention   Left Heart Cath and Coronary Angiography    Conclusion     Mid RCA lesion, 50% stenosed.  Dist RCA lesion, 30% stenosed.  Prox LAD to Mid LAD lesion, 70% stenosed.  2nd Diag lesion, 60% stenosed.  Prox LAD lesion, 99% stenosed. Post intervention, there is a 0% residual stenosis.  There  is mild left ventricular systolic dysfunction.  1. NSTEMI/Unstable angina 2. Severe stenosis proximal LAD, mid LAD 3. Successful PTCA/DES x 1 proximal/mid LAD 4. Moderate stenosis in the 2.0 mm diagonal branch 5. Moderate RCA stenosis 6. Mild segmental LV systolic dysfunction   Left Heart    Left Ventricle The left  ventricular size is normal. There is mild left ventricular systolic dysfunction. The left ventricular ejection fraction is 45-50% by visual estimate. There are wall motion abnormalities in the left ventricle.     Treatments: See Hospital Course  Discharge Exam: Blood pressure 123/77, pulse 70, temperature 97.9 F (36.6 C), temperature source Oral, resp. rate 18, height 5\' 9"  (1.753 m), weight 166 lb 14.2 oz (75.7 kg), SpO2 97 %.   Disposition: 01-Home or Self Care      Discharge Instructions    Amb Referral to Cardiac Rehabilitation    Complete by:  As directed   Diagnosis:   PCI Myocardial Infarction       Diet - low sodium heart healthy    Complete by:  As directed      Discharge instructions    Complete by:  As directed   Take research study drugs as dirrected     Driving Restrictions    Complete by:  As directed   No driving until office follow-up     Increase activity slowly    Complete by:  As directed             Medication List    TAKE these medications        atorvastatin 80 MG tablet  Commonly known as:  LIPITOR  Take 1 tablet (80 mg total) by mouth daily at 6 PM.     metoprolol tartrate 25 MG tablet  Commonly known as:  LOPRESSOR  Take 0.5 tablets (12.5 mg total) by mouth 2 (two) times daily.     nitroGLYCERIN 0.4 MG SL tablet  Commonly known as:  NITROSTAT  Place 1 tablet (0.4 mg total) under the tongue every 5 (five) minutes as needed for chest pain (CP or SOB).     omeprazole 20 MG capsule  Commonly known as:  PRILOSEC  Take 20 mg by mouth daily.     varenicline 0.5 MG X 11 & 1 MG X 42 tablet  Commonly known as:  CHANTIX STARTING MONTH PAK  Take one 0.5 mg tablet by mouth once daily for 3 days, then increase to one 0.5 mg tablet twice daily for 4 days, then increase to one 1 mg tablet twice daily.       Follow-up Information    Follow up with Norma FredricksonLORI GERHARDT, NP On 01/17/2015.   Specialties:  Nurse Practitioner, Interventional Cardiology,  Cardiology, Radiology   Why:  8:00 AM (cardiology follow-up)   Contact information:   [address] [place] [address] [phone]      TIME SPENT ON DISCHARGE, INCLUDING PHYSICIAN TIME: >30 MINUTES  Signed: Robbie LisSIMMONS, Karis Rilling 01/07/2015, 12:55 PM

## 2015-01-07 NOTE — Progress Notes (Signed)
At time of discharge, patient requested information about applying for medicaid.  SW ordered and contacted by phone.  After a couple of hours, SW had not arrived.  Contacted CM who attempted to have a financial counselor see patient.  Patient declined further waiting, and went home.

## 2015-01-07 NOTE — Research (Signed)
[facility] Research Study Informed Consent   Subject Name: Tommy Malone  Subject met inclusion and exclusion criteria.  The informed consent form, study requirements and expectations were reviewed with the subject and questions and concerns were addressed prior to the signing of the consent form.  The subject verbalized understanding of the trial requirements.  The subject agreed to participate in the [facility] trial and signed the informed consent.  The informed consent was obtained prior to performance of any protocol-specific procedures for the subject.  A copy of the signed informed consent was given to the subject and a copy was placed in the subject's medical record.  Marlana Salvage 01/07/2015, 08:10 am

## 2015-01-07 NOTE — Discharge Instructions (Signed)
Acute Coronary Syndrome  Acute coronary syndrome (ACS) is a serious problem in which there is suddenly not enough blood and oxygen supplied to the heart. ACS may mean that one or more of the blood vessels in your heart (coronary arteries) may be blocked. ACS can result in chest pain or a heart attack (myocardial infarction or MI).  CAUSES  This condition is caused by atherosclerosis, which is the buildup of fat and cholesterol (plaque) on the inside of the arteries. Over time, the plaque may narrow or block the artery, and this will lessen blood flow to the heart. Plaque can also become weak and break off within a coronary artery to form a clot and cause a sudden blockage.  RISK FACTORS  The risks factors of this condition include:   High cholesterol levels.   High blood pressure (hypertension).   Smoking.   Diabetes.   Age.   Family history of chest pain, heart disease, or stroke.   Lack of exercise.  SYMPTOMS  The most common signs of this condition include:   Chest pain, which can be:    A crushing or squeezing in the chest.    A tightness, pressure, fullness, or heaviness in the chest.    Present for more than a few minutes, or it can stop and recur.   Pain in the arms, neck, jaw, or back.   Unexplained heartburn or indigestion.   Shortness of breath.   Nausea.   Sudden cold sweats.   Feeling light-headed or dizzy.  Sometimes, this condition has no symptoms.  DIAGNOSIS  ACS may be diagnosed through the following tests:   Electrocardiogram (ECG).   Blood tests.   Coronary angiogram. This is a procedure to look at the coronary arteries to see if there is any blockage.  TREATMENT  Treatment for ACS may include:   Healthy behavioral changes to reduce or control risk factors.   Medicine.   Coronary stenting.A stent helps to keep an artery open.   Coronary angioplasty. This procedure widens a narrowed or blocked artery.   Coronary artery bypass surgery. This will allow your blood to pass the  blockage (bypass) to reach your heart.  HOME CARE INSTRUCTIONS  Eating and Drinking   Follow a heart-healthy diet. A dietitian can you help to educate you about healthy food options and changes.   Use healthy cooking methods such as roasting, grilling, broiling, baking, poaching, steaming, or stir-frying. Talk to a dietitian to learn more about healthy cooking methods.  Medicines   Take medicines only as directed by your health care provider.   Do not take the following medicines unless your health care provider approves:    Nonsteroidal anti-inflammatory drugs (NSAIDs), such as ibuprofen, naproxen, or celecoxib.    Vitamin supplements that contain vitamin A, vitamin E, or both.    Hormone replacement therapy that contains estrogen with or without progestin.   Stop illegal drug use.  Activities   Follow an exercise program that is approved by your health care provider.   Plan rest periods when you are fatigued.  Lifestyle   Do not use any tobacco products, including cigarettes, chewing tobacco, or electronic cigarettes. If you need help quitting, ask your health care provider.   If you drink alcohol, and your health care provider approves, limit your alcohol intake to no more than 1 drink per day. One drink equals 12 ounces of beer, 5 ounces of wine, or 1 ounces of hard liquor.   Learn to manage   stress.   Maintain a healthy weight. Lose weight as approved by your health care provider.  General Instructions   Manage other health conditions, such as hypertension and diabetes, as directed by your health care provider.   Keep all follow-up visits as directed by your health care provider. This is important.   Your health care provider may ask you to monitor your blood pressure. A blood pressure reading consists of a higher number over a lower number, such as 110 over 72, written as 110/72. Ideally, your blood pressure should be:    Below 140/90 if you have no other medical conditions.    Below 130/80 if  you have diabetes or kidney disease.  SEEK IMMEDIATE MEDICAL CARE IF:   You have pain in your chest, neck, arm, jaw, stomach, or back that lasts more than a few minutes, is recurring, or is not relieved by taking medicine under your tongue (sublingual nitroglycerin).   You have profuse sweating without cause.   You have unexplained:    Heartburn or indigestion.    Shortness of breath or difficulty breathing.    Nausea or vomiting.    Fatigue.    Feelings of nervousness or anxiety.    Weakness.    Diarrhea.   You have sudden light-headedness or dizziness.   You faint.  These symptoms may represent a serious problem that is an emergency. Do not wait to see if the symptoms will go away. Get medical help right away. Call your local emergency services (911 in the [place] Do not drive yourself to the clinic or hospital.     This information is not intended to replace advice given to you by your health care provider. Make sure you discuss any questions you have with your health care provider.     Document Released: 01/15/2005 Document Revised: 02/05/2014 Document Reviewed: 05/19/2013  Elsevier Interactive Patient Education 2016 [employer]

## 2015-01-07 NOTE — Progress Notes (Signed)
  Echocardiogram 2D Echocardiogram has been performed.  Leta JunglingCooper, Tyree Fluharty M 01/07/2015, 2:04 PM

## 2015-01-07 NOTE — Progress Notes (Signed)
CARDIAC REHAB PHASE I   PRE:  Rate/Rhythm: 69 SR  BP:  Supine: 123/77  Sitting:  Standing:    SaO2:   MODE:  Ambulation1000:  ft   POST:  Rate/Rhythm: 78 SR- 82  BP:  Supine:   Sitting: 123/84  Standing:    SaO2:  0830-0940 Pt walked 1000 ft with steady gait. Tolerated well. No CP. MI education completed with pt who voiced understanding. Stressed importance of brilinta with stent. Discussed heart healthy diet. Pt has quit smoking before on chantix for 16 months and is interested in using it again. Did not want fake cigarette. Gave smoking cessation handout. Discussed CRP 2 and will refer to [place]. Pt receptive to ed. Under stress as he just started new job three weeks ago.   Luetta Nuttingharlene Aubreigh Fuerte, RN BSN  01/07/2015 9:36 AM

## 2015-01-07 NOTE — Progress Notes (Signed)
IV and tele removed.  Pt took shower.  Reviewed d/c instructions with patient and wife.  Pt concerned about cost of meds due to having no insurance.  SW Erie NoeVanessa called by Marylene LandJim RN.  Marylene LandAngela RN w/ care management CM called.  While waiting for CM patient stated he had to go to pick up his paycheck before 4pm.  Phone number for Financial Counselor given to pt along with Rx for Chantix (pt has taken before without any side effects).  Rosanne AshingJim RN gave pt ASA and Brilinta today and these 2 meds are provided to him by [facility].  Reminded pt it is crucial to take these meds as directed.  Reviewed how to take NTG if having CP and Not to drive but call [phone].  Work note provided.  Pt d/c, refused w/c, ambulatory with wife.  Pleasant and grateful for care given.

## 2015-01-07 NOTE — Progress Notes (Signed)
UR COMPLETED  

## 2015-01-07 NOTE — Progress Notes (Signed)
CM received consult for Brilinta. No need for CM pt has agreed [place]. Gae Gallopngela Santanna Olenik RN,BSN,CM [phone]

## 2015-01-07 NOTE — Progress Notes (Signed)
       Patient Name: Tommy Malone Date of Encounter: 01/07/2015    SUBJECTIVE: Denies chest pain. Desires smoking cessation help.  TELEMETRY:  Normal sinus rhythm: Filed Vitals:   01/06/15 2000 01/06/15 2118 01/07/15 0415 01/07/15 0821  BP:  166/94 135/78 123/77  Pulse:  68 66 70  Temp:  98.4 F (36.9 C) 97.7 F (36.5 C) 97.9 F (36.6 C)  TempSrc:  Oral Oral Oral  Resp: 21 20 18 18   Height:      Weight:   166 lb 14.2 oz (75.7 kg)   SpO2:  100% 98% 97%    Intake/Output Summary (Last 24 hours) at 01/07/15 0930 Last data filed at 01/07/15 [id number]  Gross per 24 hour  Intake   1440 ml  Output   2800 ml  Net  -1360 ml   LABS: Basic Metabolic Panel:  Recent Labs  29/52/8410/09/14 0432 01/07/15 0402  NA 136 137  K 4.3 3.8  CL 106 105  CO2 25 25  GLUCOSE 103* 96  BUN 7 9  CREATININE 1.26* 1.16  CALCIUM 8.2* 8.6*   CBC:  Recent Labs  01/06/15 0432 01/07/15 0402  WBC 7.4 6.7  HGB 15.7 17.7*  HCT 47.6 51.8  MCV 90.5 89.2  PLT 139* 163   Cardiac Enzymes:  Recent Labs  01/05/15 1633 01/06/15 0024 01/06/15 0432  TROPONINI 1.06* 0.59* 1.03*   BNP: Invalid input(s): POCBNP Hemoglobin A1C:  Recent Labs  01/05/15 1633  HGBA1C 5.6   Fasting Lipid Panel:  Recent Labs  01/06/15 0432  CHOL 153  HDL 32*  LDLCALC 99  TRIG 132110  CHOLHDL 4.8    Radiology/Studies:  No new data. Chest x-ray did not reveal mass or evidence of cancer.  Physical Exam: Blood pressure 123/77, pulse 70, temperature 97.9 F (36.6 C), temperature source Oral, resp. rate 18, height 5\' 9"  (1.753 m), weight 166 lb 14.2 oz (75.7 kg), SpO2 97 %. Weight change: 1 lb 14.2 oz (0.856 kg)  Wt Readings from Last 3 Encounters:  01/07/15 166 lb 14.2 oz (75.7 kg)  07/11/14 159 lb 8 oz (72.349 kg)  08/14/13 170 lb (77.111 kg)    Right radial cath site is unremarkable Lungs are clear Extremities no edema  ASSESSMENT:  1. Coronary artery disease with long DES and LAD. 2. PAD and carotid  disease 3. Tobacco abuse 4. Hyperlipidemia  Plan:  1. Ready for discharge today 2. Start Chantix for smoking cessation. Will need a prescription. 3. Aggressive risk factor modification using high-dose statin therapy 4. Enrolled in the [place] study 5. Needs clinical follow-up in 10 days.  Selinda EonSigned, SMITH III,HENRY W 01/07/2015, 9:30 AM

## 2015-01-10 ENCOUNTER — Other Ambulatory Visit: Payer: Self-pay | Admitting: *Deleted

## 2015-01-10 MED ORDER — AMBULATORY NON FORMULARY MEDICATION: 90.0000 mg | Freq: Two times a day (BID) | Status: DC

## 2015-01-10 MED ORDER — AMBULATORY NON FORMULARY MEDICATION: 81.0000 mg | Freq: Every day | Status: DC

## 2015-01-17 ENCOUNTER — Encounter: Payer: PRIVATE HEALTH INSURANCE | Admitting: Nurse Practitioner

## 2015-01-19 ENCOUNTER — Ambulatory Visit (INDEPENDENT_AMBULATORY_CARE_PROVIDER_SITE_OTHER): Payer: PRIVATE HEALTH INSURANCE | Admitting: Physician Assistant

## 2015-01-19 ENCOUNTER — Encounter: Payer: Self-pay | Admitting: *Deleted

## 2015-01-19 ENCOUNTER — Encounter: Payer: Self-pay | Admitting: Physician Assistant

## 2015-01-19 VITALS — BP 122/82 | HR 84 | Ht 69.0 in | Wt 164.4 lb

## 2015-01-19 DIAGNOSIS — I25110 Atherosclerotic heart disease of native coronary artery with unstable angina pectoris: Secondary | ICD-10-CM

## 2015-01-19 DIAGNOSIS — Z720 Tobacco use: Secondary | ICD-10-CM

## 2015-01-19 NOTE — Patient Instructions (Addendum)
Medication Instructions:  Your physician recommends that you continue on your current medications as directed. Please refer to the Current Medication list given to you today.   Labwork: None ordered  Testing/Procedures: None ordered  Follow-Up: Your physician recommends that you schedule a follow-up appointment in:  3 MONTHS WITH DR. ROSS  Any Other Special Instructions Will Be Listed Below (If Applicable).  Smoking Cessation, Tips for Success If you are ready to quit smoking, congratulations! You have chosen to help yourself be healthier. Cigarettes bring nicotine, tar, carbon monoxide, and other irritants into your body. Your lungs, heart, and blood vessels will be able to work better without these poisons. There are many different ways to quit smoking. Nicotine gum, nicotine patches, a nicotine inhaler, or nicotine nasal spray can help with physical craving. Hypnosis, support groups, and medicines help break the habit of smoking. WHAT THINGS CAN I DO TO MAKE QUITTING EASIER?  Here are some tips to help you quit for good:  Pick a date when you will quit smoking completely. Tell all of your friends and family about your plan to quit on that date.  Do not try to slowly cut down on the number of cigarettes you are smoking. Pick a quit date and quit smoking completely starting on that day.  Throw away all cigarettes.   Clean and remove all ashtrays from your home, work, and car.  On a card, write down your reasons for quitting. Carry the card with you and read it when you get the urge to smoke.  Cleanse your body of nicotine. Drink enough water and fluids to keep your urine clear or pale yellow. Do this after quitting to flush the nicotine from your body.  Learn to predict your moods. Do not let a bad situation be your excuse to have a cigarette. Some situations in your life might tempt you into wanting a cigarette.  Never have "just one" cigarette. It leads to wanting another and  another. Remind yourself of your decision to quit.  Change habits associated with smoking. If you smoked while driving or when feeling stressed, try other activities to replace smoking. Stand up when drinking your coffee. Brush your teeth after eating. Sit in a different chair when you read the paper. Avoid alcohol while trying to quit, and try to drink fewer caffeinated beverages. Alcohol and caffeine may urge you to smoke.  Avoid foods and drinks that can trigger a desire to smoke, such as sugary or spicy foods and alcohol.  Ask people who smoke not to smoke around you.  Have something planned to do right after eating or having a cup of coffee. For example, plan to take a walk or exercise.  Try a relaxation exercise to calm you down and decrease your stress. Remember, you may be tense and nervous for the first 2 weeks after you quit, but this will pass.  Find new activities to keep your hands busy. Play with a pen, coin, or rubber band. Doodle or draw things on paper.  Brush your teeth right after eating. This will help cut down on the craving for the taste of tobacco after meals. You can also try mouthwash.   Use oral substitutes in place of cigarettes. Try using lemon drops, carrots, cinnamon sticks, or chewing gum. Keep them handy so they are available when you have the urge to smoke.  When you have the urge to smoke, try deep breathing.  Designate your home as a nonsmoking area.  If you  are a heavy smoker, ask your health care provider about a prescription for nicotine chewing gum. It can ease your withdrawal from nicotine.  Reward yourself. Set aside the cigarette money you save and buy yourself something nice.  Look for support from others. Join a support group or smoking cessation program. Ask someone at home or at work to help you with your plan to quit smoking.  Always ask yourself, "Do I need this cigarette or is this just a reflex?" Tell yourself, "Today, I choose not to  smoke," or "I do not want to smoke." You are reminding yourself of your decision to quit.  Do not replace cigarette smoking with electronic cigarettes (commonly called e-cigarettes). The safety of e-cigarettes is unknown, and some may contain harmful chemicals.  If you relapse, do not give up! Plan ahead and think about what you will do the next time you get the urge to smoke. HOW WILL I FEEL WHEN I QUIT SMOKING? You may have symptoms of withdrawal because your body is used to nicotine (the addictive substance in cigarettes). You may crave cigarettes, be irritable, feel very hungry, cough often, get headaches, or have difficulty concentrating. The withdrawal symptoms are only temporary. They are strongest when you first quit but will go away within 10-14 days. When withdrawal symptoms occur, stay in control. Think about your reasons for quitting. Remind yourself that these are signs that your body is healing and getting used to being without cigarettes. Remember that withdrawal symptoms are easier to treat than the major diseases that smoking can cause.  Even after the withdrawal is over, expect periodic urges to smoke. However, these cravings are generally short lived and will go away whether you smoke or not. Do not smoke! WHAT RESOURCES ARE AVAILABLE TO HELP ME QUIT SMOKING? Your health care provider can direct you to community resources or hospitals for support, which may include:  Group support.  Education.  Hypnosis.  Therapy.   This information is not intended to replace advice given to you by your health care provider. Make sure you discuss any questions you have with your health care provider.   Document Released: 10/14/2003 Document Revised: 02/05/2014 Document Reviewed: 07/03/2012 Elsevier Interactive Patient Education [employer]    If you need a refill on your cardiac medications before your next appointment, please call your pharmacy.

## 2015-01-19 NOTE — Progress Notes (Signed)
Cardiology Office Note   Date:  01/19/2015   ID:  Tommy Malone, Tommy Malone 12-Jun-1965, MRN [id number]  PCP:  Katy Apo, MD  Cardiologist:  Dr Tenny Craw (last seen in 2014)  Chief Complaint  Patient presents with  . Hospitalization Follow-up    seen for Dr. Tenny Craw, post PCI followup  . Coronary Artery Disease      History of Present Illness: Tommy Malone is a 49 y.o. male who presents for post hospital followup. He has past history of tobacco abuse, untreated hyperlipidemia, lower extremity PAD s/p aortobifemoral bypass graft by Dr. Imogene Burn in 12/2012, and bilateral carotid artery disease presenting to [facility] on 01/05/2015 with NSTEMI. He was seen by Dr. Tenny Craw in December 2014 for preoperative risk evaluation prior to undergoing bilateral femoral artery bypass graft. Myoview stress tests was normal without ischemia, LVEF was normal 57%. He also had bilateral carotid artery disease with evidence of left subclavian stenosis. Carotid Doppler performed in January 2015 showed 40% right ICA stenosis and 40-59% left ICA stenosis. There was no documentation of the degree of his left subclavian stenosis.   According to initial report, patient has been complaining of chest pain on and off for the last week, he initially thought it was acid reflux. He described the chest discomfort as substernal chest burning sensation radiating to the left arm occurring with exertion. Cardiac catheterization was performed on 12/18 which showed 99% proximal LAD lesion treated with DES (Xience 3.5 x 33 mm DES), 60% D2 stenosis, 70% proximal to mid LAD lesion covered with same stent, 50% mid RCA lesion, 30% distal RCA lesion, EF 45-50%. Postprocedure, he was enrolled in [place] trial and placed on aspirin and Brilinta. Echocardiogram was obtained on 12/9 which showed EF 55-60%, biplane LVEF 67% by speckle tracking, not all myocardial segments were well visualized, normal diastolic function, unable to assess PASP. Patient  presents today for post hospital follow-up.  He unfortunately lost his previous job and recently started a new one. He has not been taking his metoprolol, lipitor, prilosec and chantix since discharge due to financial issues. He plans to get those medication next Friday after his first paycheck. He says he understand the importance of compliance with DAPT after the new stent and since he was enrolled in [place] Trial which provide free ASA and Brilinta for him, he has been compliant on the medication. He continue to smoke < 1 ppd, although he says he plan to quit after taking Chantix next Friday. He says he has quit smoking before with help from Chantix and he's willing to do it again.     Past Medical History  Diagnosis Date  . Acid reflux   . Depression   . Anxiety   . Peripheral vascular disease (HCC)   . Hemorrhoid   . NSTEMI (non-ST elevated myocardial infarction) (HCC) 12/2014  . History of kidney stones     Past Surgical History  Procedure Laterality Date  . Elbow surgery Bilateral   . Kidney stone surgery      x2  . Aorta - bilateral femoral artery bypass graft N/A 01/07/2013    Procedure: AORTA BIFEMORAL BYPASS GRAFT;  Surgeon: Fransisco Hertz, MD;  Location: [facility] OR;  Service: Vascular;  Laterality: N/A;  . Abdominal aortagram N/A 12/18/2012    Procedure: ABDOMINAL AORTAGRAM;  Surgeon: Fransisco Hertz, MD;  Location: [facility] CATH LAB;  Service: Cardiovascular;  Laterality: N/A;  . Cardiac catheterization N/A 01/06/2015    Procedure: Left  Heart Cath and Coronary Angiography;  Surgeon: Kathleene Hazel, MD;  Location: [facility] INVASIVE CV LAB;  Service: Cardiovascular;  Laterality: N/A;  . Cardiac catheterization N/A 01/06/2015    Procedure: Coronary Stent Intervention;  Surgeon: Kathleene Hazel, MD;  Location: [facility] INVASIVE CV LAB;  Service: Cardiovascular;  Laterality: N/A;     Current Outpatient Prescriptions  Medication Sig Dispense Refill  . AMBULATORY NON FORMULARY MEDICATION  Take 90 mg by mouth 2 (two) times daily. Medication Name: Brilinta 90 mg BID (TWILIGHT Research Provided do not fill)    . AMBULATORY NON FORMULARY MEDICATION Take 81 mg by mouth daily. Medication Name: ASA 81 mg daily (TWILIGHT Research Study provided)    . atorvastatin (LIPITOR) 80 MG tablet Take 1 tablet (80 mg total) by mouth daily at 6 PM. 30 tablet 5  . metoprolol tartrate (LOPRESSOR) 25 MG tablet Take 0.5 tablets (12.5 mg total) by mouth 2 (two) times daily. 60 tablet 5  . nitroGLYCERIN (NITROSTAT) 0.4 MG SL tablet Place 0.4 mg under the tongue every 5 (five) minutes as needed for chest pain (x 3 doses).    [name] omeprazole (PRILOSEC) 20 MG capsule Take 20 mg by mouth daily.    . varenicline (CHANTIX STARTING MONTH PAK) 0.5 MG X 11 & 1 MG X 42 tablet Take one 0.5 mg tablet by mouth once daily for 3 days, then increase to one 0.5 mg tablet twice daily for 4 days, then increase to one 1 mg tablet twice daily. 53 tablet 0   No current facility-administered medications for this visit.    Allergies:   Simvastatin    Social History:  The patient  reports that he has been smoking Cigarettes and E-cigarettes.  He has a 30 pack-year smoking history. He has never used smokeless tobacco. He reports that he does not drink alcohol or use illicit drugs.   Family History:  The patient's family history includes Cancer in his paternal grandfather; Heart attack in his father and mother; Heart disease in his father, maternal grandmother, and mother; Hyperlipidemia in his father and mother.    ROS:  Please see the history of present illness.  Otherwise, review of systems are positive for occasional chest discomfort.   All other systems are reviewed and negative.    PHYSICAL EXAM: VS:  BP 122/82 mmHg  Pulse 84  Ht  (1.753 m)  Wt 164 lb 6.4 oz (74.571 kg)  BMI 24.27 kg/m2 , BMI Body mass index is 24.27 kg/(m^2). GEN: Well nourished, well developed, in no acute distress HEENT: normal Neck: no JVD,  carotid bruits, or masses Cardiac: RRR; no murmurs, rubs, or gallops,no edema  Respiratory:  clear to auscultation bilaterally, normal work of breathing GI: soft, nontender, nondistended, + BS MS: no deformity or atrophy Skin: warm and dry, no rash Neuro:  Strength and sensation are intact Psych: euthymic mood, full affect   EKG:  EKG is ordered today. The ekg ordered today demonstrates NSR with TWI in lateral and septal leads, compare to previous EKG TWI in lead V4-V5 has improved.    Recent Labs: 01/06/2015: ALT 15* 01/07/2015: BUN 9; Creatinine, Ser 1.16; Hemoglobin 17.7*; Platelets 163; Potassium 3.8; Sodium 137    Lipid Panel    Component Value Date/Time   CHOL 153 01/06/2015 0432   TRIG 110 01/06/2015 0432   HDL 32* 01/06/2015 0432   CHOLHDL 4.8 01/06/2015 0432   VLDL 22 01/06/2015 0432   LDLCALC 99 01/06/2015 0432  Wt Readings from Last 3 Encounters:  01/19/15 164 lb 6.4 oz (74.571 kg)  01/07/15 166 lb 14.2 oz (75.7 kg)  07/11/14 159 lb 8 oz (72.349 kg)      Other studies Reviewed: Additional studies/ records that were reviewed today include:   01/06/2015 Conclusion     Mid RCA lesion, 50% stenosed.  Dist RCA lesion, 30% stenosed.  Prox LAD to Mid LAD lesion, 70% stenosed.  2nd Diag lesion, 60% stenosed.  Prox LAD lesion, 99% stenosed. Post intervention, there is a 0% residual stenosis.  There is mild left ventricular systolic dysfunction.  1. NSTEMI/Unstable angina 2. Severe stenosis proximal LAD, mid LAD 3. Successful PTCA/DES x 1 proximal/mid LAD 4. Moderate stenosis in the 2.0 mm diagonal branch 5. Moderate RCA stenosis 6. Mild segmental LV systolic dysfunction  Recommendations: Continue ASA and Brilinta for one year. Continue statin and beta blocker.    01/07/2015 LV EF: 55% -  60%  ------------------------------------------------------------------- Indications:   Chest pain  786.51.  ------------------------------------------------------------------- History:  PMH: NSTEMI. Peripheral Vascular Disease. Carotid Artery Stenosis. Dyspnea. Risk factors: Current tobacco use. Dyslipidemia.  ------------------------------------------------------------------- Study Conclusions  - Left ventricle: The cavity size was normal. Wall thickness was normal. Systolic function was normal. The estimated ejection fraction was in the range of 55% to 60%. Biplane LVEF 67% by speckle tracking. Left ventricular diastolic function parameters were normal. - Aortic valve: Mildly calcified annulus. Trileaflet. - Mitral valve: Calcified annulus. There was trivial regurgitation. - Atrial septum: No defect or patent foramen ovale was identified. - Tricuspid valve: There was physiologic regurgitation. - Pulmonary arteries: Systolic pressure could not be accurately estimated. - Pericardium, extracardiac: A trivial pericardial effusion was identified.  Impressions:  - Limited images overall. Normal LV wall thickness with LVEF 55-60% (biplane LVEF 67% by speckle tracking). Not all myocardial segments were well visualized. Normal diastolic function. Trivial mitral regurgitation. Unable to assess PASP. Trivial pericardial effusion.   Review of the above records demonstrates:  Recent NSTEMI, s/p PCI to prox to mid LAD.    ASSESSMENT AND PLAN:  1.  CAD s/p DES to LAD: compliant with ASA and Brilinta as he receive medication from [facility] trial. However because he lost his job he has not been taking metoprolol and lipitor. I discucssed the benefit of those medication with him. I understand his current situation and am fine with him restart those medication after his next paycheck. He has occasional chest discomfort post cath, but nothing like his angina. Due to similarity of his angina and severe GERD, I encouraged him to restart PPI. I also discussed with him  symptom of angina such as exertional chest pain which is less likely to be seen with GERD.   2. tobacco abuse: continue to smoke <1ppd, but willing to quit. Plan to start Chantix next week.  3. untreated hyperlipidemia: he plan to restart high dose lipitor as soon as his next paycheck, discussed correlation between untreated hyperlipidemia and CAD  4. lower extremity PAD s/p aortobifemoral bypass graft by Dr. Imogene Burnhen in 12/2012   Current medicines are reviewed at length with the patient today.  The patient does not have concerns regarding medicines.  The following changes have been made:  no change  Labs/ tests ordered today include:   Orders Placed This Encounter  Procedures  . EKG 12-Lead     Disposition:   FU with Dr. Tenny Crawoss in 3 months  Signed, Azalee CourseHao Mckinzy Fuller PA 01/19/2015 4:52 PM    [facility]  [address], [place], [place]  [address] Phone: [phone]; Fax: [phone]    

## 2015-02-08 ENCOUNTER — Inpatient Hospital Stay (HOSPITAL_COMMUNITY)
Admission: AD | Admit: 2015-02-08 | Discharge: 2015-02-09 | DRG: 303 | Disposition: A | Discharge status: Home / Self Care | Payer: Self-pay | Source: Ambulatory Visit | Attending: Cardiology | Admitting: Cardiology

## 2015-02-08 ENCOUNTER — Encounter: Payer: Self-pay | Admitting: Cardiology

## 2015-02-08 ENCOUNTER — Ambulatory Visit (INDEPENDENT_AMBULATORY_CARE_PROVIDER_SITE_OTHER): Payer: Self-pay | Admitting: Cardiology

## 2015-02-08 ENCOUNTER — Inpatient Hospital Stay (HOSPITAL_COMMUNITY): Payer: Self-pay

## 2015-02-08 ENCOUNTER — Encounter (HOSPITAL_COMMUNITY): Payer: Self-pay | Admitting: Nurse Practitioner

## 2015-02-08 VITALS — BP 128/86 | HR 83 | Ht 69.0 in | Wt 167.8 lb

## 2015-02-08 DIAGNOSIS — Z91120 Patient's intentional underdosing of medication regimen due to financial hardship: Secondary | ICD-10-CM

## 2015-02-08 DIAGNOSIS — I6523 Occlusion and stenosis of bilateral carotid arteries: Secondary | ICD-10-CM | POA: Diagnosis present

## 2015-02-08 DIAGNOSIS — Z720 Tobacco use: Secondary | ICD-10-CM | POA: Diagnosis present

## 2015-02-08 DIAGNOSIS — F32A Depression, unspecified: Secondary | ICD-10-CM | POA: Diagnosis present

## 2015-02-08 DIAGNOSIS — F329 Major depressive disorder, single episode, unspecified: Secondary | ICD-10-CM | POA: Diagnosis present

## 2015-02-08 DIAGNOSIS — Z955 Presence of coronary angioplasty implant and graft: Secondary | ICD-10-CM

## 2015-02-08 DIAGNOSIS — E785 Hyperlipidemia, unspecified: Secondary | ICD-10-CM

## 2015-02-08 DIAGNOSIS — I739 Peripheral vascular disease, unspecified: Secondary | ICD-10-CM | POA: Diagnosis present

## 2015-02-08 DIAGNOSIS — Z888 Allergy status to other drugs, medicaments and biological substances status: Secondary | ICD-10-CM

## 2015-02-08 DIAGNOSIS — T466X6A Underdosing of antihyperlipidemic and antiarteriosclerotic drugs, initial encounter: Secondary | ICD-10-CM | POA: Diagnosis present

## 2015-02-08 DIAGNOSIS — Z8249 Family history of ischemic heart disease and other diseases of the circulatory system: Secondary | ICD-10-CM

## 2015-02-08 DIAGNOSIS — I25110 Atherosclerotic heart disease of native coronary artery with unstable angina pectoris: Secondary | ICD-10-CM

## 2015-02-08 DIAGNOSIS — I200 Unstable angina: Secondary | ICD-10-CM | POA: Diagnosis present

## 2015-02-08 DIAGNOSIS — I2510 Atherosclerotic heart disease of native coronary artery without angina pectoris: Secondary | ICD-10-CM

## 2015-02-08 DIAGNOSIS — I708 Atherosclerosis of other arteries: Secondary | ICD-10-CM | POA: Diagnosis present

## 2015-02-08 DIAGNOSIS — Z8489 Family history of other specified conditions: Secondary | ICD-10-CM

## 2015-02-08 DIAGNOSIS — F17210 Nicotine dependence, cigarettes, uncomplicated: Secondary | ICD-10-CM | POA: Diagnosis present

## 2015-02-08 DIAGNOSIS — Z95828 Presence of other vascular implants and grafts: Secondary | ICD-10-CM

## 2015-02-08 DIAGNOSIS — Z9861 Coronary angioplasty status: Secondary | ICD-10-CM

## 2015-02-08 DIAGNOSIS — I252 Old myocardial infarction: Secondary | ICD-10-CM

## 2015-02-08 LAB — CBC WITH DIFFERENTIAL/PLATELET
BASOS ABS: 0 10*3/uL (ref 0.0–0.1)
Basophils Relative: 0 %
EOS ABS: 0.1 10*3/uL (ref 0.0–0.7)
EOS PCT: 1 %
HCT: 50.1 % (ref 39.0–52.0)
Hemoglobin: 17.1 g/dL — ABNORMAL HIGH (ref 13.0–17.0)
Lymphocytes Relative: 20 %
Lymphs Abs: 1.8 10*3/uL (ref 0.7–4.0)
MCH: 30.8 pg (ref 26.0–34.0)
MCHC: 34.1 g/dL (ref 30.0–36.0)
MCV: 90.3 fL (ref 78.0–100.0)
MONO ABS: 0.8 10*3/uL (ref 0.1–1.0)
Monocytes Relative: 9 %
Neutro Abs: 6.3 10*3/uL (ref 1.7–7.7)
Neutrophils Relative %: 70 %
PLATELETS: 174 10*3/uL (ref 150–400)
RBC: 5.55 MIL/uL (ref 4.22–5.81)
RDW: 13.5 % (ref 11.5–15.5)
WBC: 9.1 10*3/uL (ref 4.0–10.5)

## 2015-02-08 LAB — COMPREHENSIVE METABOLIC PANEL
ALK PHOS: 79 U/L (ref 38–126)
ALT: 18 U/L (ref 17–63)
AST: 19 U/L (ref 15–41)
Albumin: 3.5 g/dL (ref 3.5–5.0)
Anion gap: 9 (ref 5–15)
BUN: 8 mg/dL (ref 6–20)
CHLORIDE: 101 mmol/L (ref 101–111)
CO2: 29 mmol/L (ref 22–32)
CREATININE: 1.12 mg/dL (ref 0.61–1.24)
Calcium: 9.1 mg/dL (ref 8.9–10.3)
GFR calc Af Amer: >60 mL/min (ref >60)
Glucose, Bld: 98 mg/dL (ref 65–99)
Potassium: 4.6 mmol/L (ref 3.5–5.1)
SODIUM: 139 mmol/L (ref 135–145)
Total Bilirubin: 0.8 mg/dL (ref 0.3–1.2)
Total Protein: 5.9 g/dL — ABNORMAL LOW (ref 6.5–8.1)

## 2015-02-08 LAB — TROPONIN I

## 2015-02-08 LAB — PROTIME-INR
INR: 1.03 (ref 0.00–1.49)
PROTHROMBIN TIME: 13.7 s (ref 11.6–15.2)

## 2015-02-08 LAB — MAGNESIUM: Magnesium: 2.1 mg/dL (ref 1.7–2.4)

## 2015-02-08 LAB — HEPARIN LEVEL (UNFRACTIONATED): HEPARIN UNFRACTIONATED: 0.58 [IU]/mL (ref 0.30–0.70)

## 2015-02-08 LAB — TSH: TSH: 1.327 u[IU]/mL (ref 0.350–4.500)

## 2015-02-08 MED ORDER — PANTOPRAZOLE SODIUM 40 MG PO TBEC
40.0000 mg | DELAYED_RELEASE_TABLET | Freq: Every day | ORAL | Status: DC
  Administered 2015-02-08 – 2015-02-09 (×2): 40 mg via ORAL
  Filled 2015-02-08 (×2): qty 1

## 2015-02-08 MED ORDER — HEPARIN BOLUS VIA INFUSION
4000.0000 [IU] | Freq: Once | INTRAVENOUS | Status: AC
Start: 2015-02-08 — End: 2015-02-08
  Administered 2015-02-08: 4000 [IU] via INTRAVENOUS
  Filled 2015-02-08: qty 4000

## 2015-02-08 MED ORDER — NITROGLYCERIN 2 % TD OINT
1.0000 [in_us] | TOPICAL_OINTMENT | Freq: Four times a day (QID) | TRANSDERMAL | Status: DC
Start: 2015-02-08 — End: 2015-02-09
  Administered 2015-02-08 (×3): 1 [in_us] via TOPICAL
  Filled 2015-02-08: qty 30

## 2015-02-08 MED ORDER — ONDANSETRON HCL 4 MG/2ML IJ SOLN: 4.0000 mg | Freq: Four times a day (QID) | INTRAMUSCULAR | Status: DC | PRN

## 2015-02-08 MED ORDER — HEPARIN (PORCINE) IN NACL 100-0.45 UNIT/ML-% IJ SOLN
1300.0000 [IU]/h | INTRAMUSCULAR | Status: DC
  Administered 2015-02-08 – 2015-02-09 (×2): 1300 [IU]/h via INTRAVENOUS
  Filled 2015-02-08 (×2): qty 250

## 2015-02-08 MED ORDER — NON FORMULARY: 1.0000 | Freq: Two times a day (BID) | Status: DC

## 2015-02-08 MED ORDER — NONFORMULARY OR COMPOUNDED ITEM
1.0000 | Freq: Every day | Status: DC
  Administered 2015-02-09: 1 via ORAL
  Filled 2015-02-08 (×3): qty 1

## 2015-02-08 MED ORDER — ASPIRIN 81 MG PO CHEW
324.0000 mg | CHEWABLE_TABLET | ORAL | Status: AC
  Administered 2015-02-08: 243 mg via ORAL

## 2015-02-08 MED ORDER — ALPRAZOLAM 0.25 MG PO TABS: 0.2500 mg | ORAL_TABLET | Freq: Two times a day (BID) | ORAL | Status: DC | PRN

## 2015-02-08 MED ORDER — ACETAMINOPHEN 325 MG PO TABS: 650.0000 mg | ORAL_TABLET | ORAL | Status: DC | PRN

## 2015-02-08 MED ORDER — ZOLPIDEM TARTRATE 5 MG PO TABS: 5.0000 mg | ORAL_TABLET | Freq: Every evening | ORAL | Status: DC | PRN

## 2015-02-08 MED ORDER — ASPIRIN EC 81 MG PO TBEC: 81.0000 mg | DELAYED_RELEASE_TABLET | Freq: Every day | ORAL | Status: DC

## 2015-02-08 MED ORDER — NITROGLYCERIN 0.4 MG SL SUBL: 0.4000 mg | SUBLINGUAL_TABLET | SUBLINGUAL | Status: DC | PRN

## 2015-02-08 MED ORDER — NONFORMULARY OR COMPOUNDED ITEM
1.0000 | Freq: Two times a day (BID) | Status: DC
  Administered 2015-02-08 – 2015-02-09 (×2): 1 via ORAL
  Filled 2015-02-08 (×5): qty 1

## 2015-02-08 MED ORDER — SODIUM CHLORIDE 0.9 % IV SOLN
INTRAVENOUS | Status: DC
  Administered 2015-02-08: 14:00:00 via INTRAVENOUS

## 2015-02-08 MED ORDER — NON FORMULARY: 1.0000 | Freq: Every day | Status: DC

## 2015-02-08 MED ORDER — METOPROLOL TARTRATE 12.5 MG HALF TABLET
12.5000 mg | ORAL_TABLET | Freq: Two times a day (BID) | ORAL | Status: DC
  Administered 2015-02-08 – 2015-02-09 (×2): 12.5 mg via ORAL
  Filled 2015-02-08 (×2): qty 1

## 2015-02-08 MED ORDER — ATORVASTATIN CALCIUM 80 MG PO TABS
80.0000 mg | ORAL_TABLET | Freq: Every day | ORAL | Status: DC
  Administered 2015-02-08: 80 mg via ORAL
  Filled 2015-02-08: qty 1

## 2015-02-08 MED ORDER — ASPIRIN 300 MG RE SUPP
300.0000 mg | RECTAL | Status: AC
  Filled 2015-02-08: qty 1

## 2015-02-08 MED ORDER — ASPIRIN 81 MG PO CHEW
CHEWABLE_TABLET | ORAL | Status: AC
  Administered 2015-02-08: 243 mg via ORAL
  Filled 2015-02-08: qty 3

## 2015-02-08 MED ORDER — SERTRALINE HCL 50 MG PO TABS: 50.0000 mg | ORAL_TABLET | Freq: Every day | ORAL | Status: DC

## 2015-02-08 NOTE — Progress Notes (Signed)
Cardiology Office Note   Date:  02/08/2015   ID:  Tommy Malone [birth year], MRN [id number]  PCP:  Katy Apo, MD  Cardiologist:  Dr. Tenny Craw    Chief Complaint  Patient presents with  . Chest Pain    has increased since discharge      History of Present Illness: Tommy Malone is a 50 y.o. male who presents for calling in for chest pressure..  He has past history of tobacco abuse, untreated hyperlipidemia, lower extremity PAD s/p aortobifemoral bypass graft by Dr. Imogene Burn in 12/2012, and bilateral carotid artery disease presenting to [facility] on 01/05/2015 with NSTEMI. He was seen by Dr. Tenny Craw in December 2014 for preoperative risk evaluation prior to undergoing bilateral femoral artery bypass graft. Myoview stress tests was normal without ischemia, LVEF was normal 57%. He also had bilateral carotid artery disease with evidence of left subclavian stenosis. Carotid Doppler performed in January 2015 showed 40% right ICA stenosis and 40-59% left ICA stenosis. There was no documentation of the degree of his left subclavian stenosis.   Cardiac catheterization was performed on 12/18 which showed 99% proximal LAD lesion treated with DES (Xience 3.5 x 33 mm DES), 60% D2 stenosis, 70% proximal to mid LAD lesion covered with same stent, 50% mid RCA lesion, 30% distal RCA lesion, EF 45-50%. Postprocedure, he was enrolled in [place] trial and placed on aspirin and Brilinta. Echocardiogram was obtained on 12/9 which showed EF 55-60%, biplane LVEF 67% by speckle tracking, not all myocardial segments were well visualized, normal diastolic function, unable to assess PASP.   Pt called in asking to be seen for chest pain.  Occurs 2-3 times a day and last 20-30 min.  No associated symptoms.  Pain has some similar qualities to prior pain but he has not had the burning that he had prior to his cath.  He has some SOB but that is much improved since his PCI.  He is under a lot of stress and is very  depressed because he lost his job.  He is on ASA and brilinta through twilight study. He is on BB but not on lipids due to financial issues.  He complains of depression.  But no suicidal ideations.  He has taken zoloft in the past.     Past Medical History  Diagnosis Date  . Acid reflux   . Depression   . Anxiety   . Peripheral vascular disease (HCC)   . Hemorrhoid   . NSTEMI (non-ST elevated myocardial infarction) (HCC) 12/2014  . History of kidney stones     Past Surgical History  Procedure Laterality Date  . Elbow surgery Bilateral   . Kidney stone surgery      x2  . Aorta - bilateral femoral artery bypass graft N/A 01/07/2013    Procedure: AORTA BIFEMORAL BYPASS GRAFT;  Surgeon: Fransisco Hertz, MD;  Location: [facility] OR;  Service: Vascular;  Laterality: N/A;  . Abdominal aortagram N/A 12/18/2012    Procedure: ABDOMINAL AORTAGRAM;  Surgeon: Fransisco Hertz, MD;  Location: [facility] CATH LAB;  Service: Cardiovascular;  Laterality: N/A;  . Cardiac catheterization N/A 01/06/2015    Procedure: Left Heart Cath and Coronary Angiography;  Surgeon: Kathleene Hazel, MD;  Location: [facility] INVASIVE CV LAB;  Service: Cardiovascular;  Laterality: N/A;  . Cardiac catheterization N/A 01/06/2015    Procedure: Coronary Stent Intervention;  Surgeon: Kathleene Hazel, MD;  Location: [facility] INVASIVE CV LAB;  Service: Cardiovascular;  Laterality: N/A;  Current Outpatient Prescriptions  Medication Sig Dispense Refill  . AMBULATORY NON FORMULARY MEDICATION Take 90 mg by mouth 2 (two) times daily. Medication Name: Brilinta 90 mg BID (TWILIGHT Research Provided do not fill)    . AMBULATORY NON FORMULARY MEDICATION Take 81 mg by mouth daily. Medication Name: ASA 81 mg daily (TWILIGHT Research Study provided)    . atorvastatin (LIPITOR) 80 MG tablet Take 1 tablet (80 mg total) by mouth daily at 6 PM. 30 tablet 5  . metoprolol tartrate (LOPRESSOR) 25 MG tablet Take 0.5 tablets (12.5 mg total) by mouth 2 (two) times  daily. 60 tablet 5  . nitroGLYCERIN (NITROSTAT) 0.4 MG SL tablet Place 0.4 mg under the tongue every 5 (five) minutes as needed for chest pain (x 3 doses).    [name]. omeprazole (PRILOSEC) 20 MG capsule Take 20 mg by mouth daily.    . varenicline (CHANTIX STARTING MONTH PAK) 0.5 MG X 11 & 1 MG X 42 tablet Take one 0.5 mg tablet by mouth once daily for 3 days, then increase to one 0.5 mg tablet twice daily for 4 days, then increase to one 1 mg tablet twice daily. 53 tablet 0   No current facility-administered medications for this visit.    Allergies:   Simvastatin    Social History:  The patient  reports that he has been smoking Cigarettes and E-cigarettes.  He has a 30 pack-year smoking history. He has never used smokeless tobacco. He reports that he does not drink alcohol or use illicit drugs.   Family History:  The patient's family history includes Cancer in his paternal grandfather; Heart attack in his father and mother; Heart disease in his father, maternal grandmother, and mother; Hyperlipidemia in his father and mother.    ROS:  See HPI otherwise ROS negative  Wt Readings from Last 3 Encounters:  02/08/15 167 lb 12.8 oz (76.114 kg)  01/19/15 164 lb 6.4 oz (74.571 kg)  01/07/15 166 lb 14.2 oz (75.7 kg)     PHYSICAL EXAM: VS:  BP 128/86 mmHg  Pulse 83  Ht 5\' 9"  (1.753 m)  Wt 167 lb 12.8 oz (76.114 kg)  BMI 24.77 kg/m2 , BMI Body mass index is 24.77 kg/(m^2). Neck: suppler, no LAD, no bruits Lung: CTA bilaterally Heart:  RRR with no M/R/G Abd:  Soft, NT, ND Ext: no C/E/E   EKG:  EKG is ordered today. The ekg ordered today demonstrates SR with non specific ST changes.    Recent Labs: 01/06/2015: ALT 15* 01/07/2015: BUN 9; Creatinine, Ser 1.16; Hemoglobin 17.7*; Platelets 163; Potassium 3.8; Sodium 137    Lipid Panel    Component Value Date/Time   CHOL 153 01/06/2015 0432   TRIG 110 01/06/2015 0432   HDL 32* 01/06/2015 0432   CHOLHDL 4.8 01/06/2015 0432   VLDL 22  01/06/2015 0432   LDLCALC 99 01/06/2015 0432       Other studies Reviewed: Additional studies/ records that were reviewed today include: previous cath.   ASSESSMENT AND PLAN:  1. Unstable angina- pain a couple of times daily though no associated symptoms.  Pain lasts 20-30 min before it resolves. Rated 4/10 similar to admit pain but no burning.  EKG shows no ischemia.  Recommend admission to tele observation to rule out MI with enzymes.  NPO after MN and plan nuclear stress test in am if enzymes are negative.  2. CAD s/p DES to LAD: compliant with ASA and Brilinta as he receive medication from [facility] trial. However  because he lost his job he has not been taking lipitor but has started lopressor.  Has difficulty affording medications.  3. tobacco abuse: continue to smoke <1ppd, but willing to quit. Plan to start Chantix when he can afford.  4. untreated hyperlipidemia: he plans to restart high dose lipitor as soon as his next paycheck, discussed correlation between untreated hyperlipidemia and CAD  5. lower extremity PAD s/p aortobifemoral bypass graft by Dr. Imogene Burn in 12/2012  6.  Depression has taken zoloft in the past but unable to afford PCP     Current medicines are reviewed with the patient today.  The patient Has no concerns regarding medicines.  The following changes have been made:  See above Labs/ tests ordered today include:see above  Disposition:   FU:  see above  Signed, Leone Brand, NP  02/08/2015 11:36 AM    [facility] [address] [place], [place], [place]  [address]/ [address] [place], [place] Phone: [phone]; Fax: [phone]   Patient seen and examined with Nada Boozer, NP. We discussed all aspects of the encounter. I agree with the assessment and plan as stated above.  Patient with recurrent bouts of prolonged CP which are similar to prior angina but no burning like before.  SOB has actually improved since his  PCI.  EKG is nonischemic.  He is s/p DES to LAD with residual 50% RCA.  Will admit for rule out MI.  NPO after MN.  Nuclear stress test in am if enzymes negative.  Will cover with IV Heparin until MI ruled out.  Signed: Armanda Magic, MD [facility] 02/07/2014 [phone]

## 2015-02-08 NOTE — Patient Instructions (Signed)
Medication Instructions:  None  Labwork: None  Testing/Procedures: None  Follow-Up: Will be determined in the hospital  Any Other Special Instructions Will Be Listed Below (If Applicable).  [facility] [address]   If you need a refill on your cardiac medications before your next appointment, please call your pharmacy.

## 2015-02-08 NOTE — Progress Notes (Signed)
ANTICOAGULATION CONSULT NOTE - Follow Up Consult  Pharmacy Consult for heparin Indication: chest pain/ACS  Allergies  Allergen Reactions  . Simvastatin Nausea Only    Stomach aches    Patient Measurements: Height: 5\' 9"  (175.3 cm) Weight: 161 lb 4.8 oz (73.165 kg) IBW/kg (Calculated) : 70.7 Heparin Dosing Weight: 73kg  Vital Signs: Temp: 97.9 F (36.6 C) (01/10 1215) Temp Source: Oral (01/10 1215) BP: 109/69 mmHg (01/10 1748) Pulse Rate: 80 (01/10 1748)  Labs:  Recent Labs  02/08/15 1325 02/08/15 1940  HGB 17.1*  --   HCT 50.1  --   PLT 174  --   LABPROT 13.7  --   INR 1.03  --   HEPARINUNFRC  --  0.58  CREATININE 1.12  --   TROPONINI <0.03  --     Estimated Creatinine Clearance: 80.7 mL/min (by C-G formula based on Cr of 1.12).   Medical History: Past Medical History  Diagnosis Date  . Acid reflux   . Depression   . Anxiety   . Peripheral vascular disease (HCC)   . Hemorrhoid   . NSTEMI (non-ST elevated myocardial infarction) (HCC) 12/2014  . History of kidney stones     Medications:  Infusions:  . sodium chloride 10 mL/hr at 02/08/15 1427  . heparin 1,300 Units/hr (02/08/15 1428)    Assessment: 49 yom presented with CP on IV heparin at 1300 units/hr. F/u HL is therapeutic at 0.58. Hgb 17.1. Plt wnl.   Goal of Therapy:  Heparin level 0.3-0.7 units/ml Monitor platelets by anticoagulation protocol: Yes   Plan:  - Continue heparin gtt at 1300 units/hr - Check a confirmatory AM heparin level  - Daily heparin level and CBC  Vinnie LevelBenjamin Tomie Spizzirri, PharmD., BCPS Clinical Pharmacist Pager [phone]

## 2015-02-08 NOTE — H&P (Signed)
Cardiology Office Note   Date: 02/08/2015   ID: Tommy, Malone 1965-04-14, MRN [id number]  PCP: Katy Apo, MD Cardiologist: Dr. Tenny Craw   Chief Complaint  Patient presents with  . Chest Pain    has increased since discharge     History of Present Illness: Tommy Malone is a 50 y.o. male who presents for calling in for chest pressure..  He has past history of tobacco abuse, untreated hyperlipidemia, lower extremity PAD s/p aortobifemoral bypass graft by Dr. Imogene Burn in 12/2012, and bilateral carotid artery disease presenting to [facility] on 01/05/2015 with NSTEMI. He was seen by Dr. Tenny Craw in December 2014 for preoperative risk evaluation prior to undergoing bilateral femoral artery bypass graft. Myoview stress tests was normal without ischemia, LVEF was normal 57%. He also had bilateral carotid artery disease with evidence of left subclavian stenosis. Carotid Doppler performed in January 2015 showed 40% right ICA stenosis and 40-59% left ICA stenosis. There was no documentation of the degree of his left subclavian stenosis.   Cardiac catheterization was performed on 12/18 which showed 99% proximal LAD lesion treated with DES (Xience 3.5 x 33 mm DES), 60% D2 stenosis, 70% proximal to mid LAD lesion covered with same stent, 50% mid RCA lesion, 30% distal RCA lesion, EF 45-50%. Postprocedure, he was enrolled in [place] trial and placed on aspirin and Brilinta. Echocardiogram was obtained on 12/9 which showed EF 55-60%, biplane LVEF 67% by speckle tracking, not all myocardial segments were well visualized, normal diastolic function, unable to assess PASP.   Pt called in asking to be seen for chest pain. Occurs 2-3 times a day and last 20-30 min. No associated symptoms. Pain has some similar qualities to prior pain but he has not had the burning that he had prior to his cath. He has some SOB but that is much improved since his PCI. He is under a lot of  stress and is very depressed because he lost his job. He is on ASA and brilinta through twilight study. He is on BB but not on lipids due to financial issues. He complains of depression. But no suicidal ideations. He has taken zoloft in the past.   Past Medical History  Diagnosis Date  . Acid reflux   . Depression   . Anxiety   . Peripheral vascular disease (HCC)   . Hemorrhoid   . NSTEMI (non-ST elevated myocardial infarction) (HCC) 12/2014  . History of kidney stones     Past Surgical History  Procedure Laterality Date  . Elbow surgery Bilateral   . Kidney stone surgery      x2  . Aorta - bilateral femoral artery bypass graft N/A 01/07/2013    Procedure: AORTA BIFEMORAL BYPASS GRAFT; Surgeon: Fransisco Hertz, MD; Location: [facility] OR; Service: Vascular; Laterality: N/A;  . Abdominal aortagram N/A 12/18/2012    Procedure: ABDOMINAL AORTAGRAM; Surgeon: Fransisco Hertz, MD; Location: [facility] CATH LAB; Service: Cardiovascular; Laterality: N/A;  . Cardiac catheterization N/A 01/06/2015    Procedure: Left Heart Cath and Coronary Angiography; Surgeon: Kathleene Hazel, MD; Location: [facility] INVASIVE CV LAB; Service: Cardiovascular; Laterality: N/A;  . Cardiac catheterization N/A 01/06/2015    Procedure: Coronary Stent Intervention; Surgeon: Kathleene Hazel, MD; Location: [facility] INVASIVE CV LAB; Service: Cardiovascular; Laterality: N/A;     Current Outpatient Prescriptions  Medication Sig Dispense Refill  . AMBULATORY NON FORMULARY MEDICATION Take 90 mg by mouth 2 (two) times daily. Medication Name: Brilinta 90 mg BID ([employer] [employer]  Provided do not fill)    . AMBULATORY NON FORMULARY MEDICATION Take 81 mg by mouth daily. Medication Name: ASA 81 mg daily ([employer] Study provided)    . atorvastatin (LIPITOR) 80 MG tablet Take 1 tablet (80 mg total) by mouth daily at 6 PM. 30  tablet 5  . metoprolol tartrate (LOPRESSOR) 25 MG tablet Take 0.5 tablets (12.5 mg total) by mouth 2 (two) times daily. 60 tablet 5  . nitroGLYCERIN (NITROSTAT) 0.4 MG SL tablet Place 0.4 mg under the tongue every 5 (five) minutes as needed for chest pain (x 3 doses).    [name]. omeprazole (PRILOSEC) 20 MG capsule Take 20 mg by mouth daily.    . varenicline (CHANTIX STARTING MONTH PAK) 0.5 MG X 11 & 1 MG X 42 tablet Take one 0.5 mg tablet by mouth once daily for 3 days, then increase to one 0.5 mg tablet twice daily for 4 days, then increase to one 1 mg tablet twice daily. 53 tablet 0   No current facility-administered medications for this visit.    Allergies: Simvastatin    Social History: The patient  reports that he has been smoking Cigarettes and E-cigarettes. He has a 30 pack-year smoking history. He has never used smokeless tobacco. He reports that he does not drink alcohol or use illicit drugs.   Family History: The patient's family history includes Cancer in his paternal grandfather; Heart attack in his father and mother; Heart disease in his father, maternal grandmother, and mother; Hyperlipidemia in his father and mother.    ROS: See HPI otherwise ROS negative  Wt Readings from Last 3 Encounters:  02/08/15 167 lb 12.8 oz (76.114 kg)  01/19/15 164 lb 6.4 oz (74.571 kg)  01/07/15 166 lb 14.2 oz (75.7 kg)     PHYSICAL EXAM: VS: BP 128/86 mmHg  Pulse 83  Ht 5\' 9"  (1.753 m)  Wt 167 lb 12.8 oz (76.114 kg)  BMI 24.77 kg/m2 , BMI Body mass index is 24.77 kg/(m^2). Neck: suppler, no LAD, no bruits Lung: CTA bilaterally Heart: RRR with no M/R/G Abd: Soft, NT, ND Ext: no C/E/E   EKG: EKG is ordered today. The ekg ordered today demonstrates SR with non specific ST changes.    Recent Labs: 01/06/2015: ALT 15* 01/07/2015: BUN 9; Creatinine, Ser 1.16; Hemoglobin 17.7*; Platelets 163; Potassium 3.8; Sodium 137    Lipid Panel  Labs (Brief)        Component Value Date/Time   CHOL 153 01/06/2015 0432   TRIG 110 01/06/2015 0432   HDL 32* 01/06/2015 0432   CHOLHDL 4.8 01/06/2015 0432   VLDL 22 01/06/2015 0432   LDLCALC 99 01/06/2015 0432        Other studies Reviewed: Additional studies/ records that were reviewed today include: previous cath.   ASSESSMENT AND PLAN:  1. Unstable angina- pain a couple of times daily though no associated symptoms. Pain lasts 20-30 min before it resolves. Rated 4/10 similar to admit pain but no burning. EKG shows no ischemia. Recommend admission to tele observation to rule out MI with enzymes. NPO after MN and plan nuclear stress test in am if enzymes are negative.  2. CAD s/p DES to LAD: compliant with ASA and Brilinta as he receive medication from [facility] trial. However because he lost his job he has not been taking lipitor but has started lopressor. Has difficulty affording medications.  3. tobacco abuse: continue to smoke <1ppd, but willing to quit. Plan to start Chantix when he can  afford.  4. untreated hyperlipidemia: he plans to restart high dose lipitor as soon as his next paycheck, discussed correlation between untreated hyperlipidemia and CAD  5. lower extremity PAD s/p aortobifemoral bypass graft by Dr. Imogene Burn in 12/2012  6. Depression has taken zoloft in the past but unable to afford PCP     Current medicines are reviewed with the patient today. The patient Has no concerns regarding medicines.  The following changes have been made: See above Labs/ tests ordered today include:see above  Disposition: FU: see above  Signed, Leone Brand, NP  02/08/2015 11:36 AM  [facility] [address] [place], [place], [place] [address]/ [address] [place], [place] Phone: [phone]; Fax: [phone]   Patient seen and examined with Nada Boozer, NP. We discussed all aspects of the encounter. I  agree with the assessment and plan as stated above. Patient with recurrent bouts of prolonged CP which are similar to prior angina but no burning like before. SOB has actually improved since his PCI. EKG is nonischemic. He is s/p DES to LAD with residual 50% RCA. Will admit for rule out MI. NPO after MN. Nuclear stress test in am if enzymes negative. Will cover with IV Heparin until MI ruled out.  Signed: Armanda Magic, MD [facility] 02/07/2014 [phone]

## 2015-02-08 NOTE — Progress Notes (Signed)
ANTICOAGULATION CONSULT NOTE - Initial Consult  Pharmacy Consult for heparin Indication: chest pain/ACS  Allergies  Allergen Reactions  . Simvastatin Nausea Only    Stomach aches    Patient Measurements: Height: 5\' 9"  (175.3 cm) Weight: 161 lb 4.8 oz (73.165 kg) IBW/kg (Calculated) : 70.7 Heparin Dosing Weight: 73kg  Vital Signs: Temp: 97.9 F (36.6 C) (01/10 1215) Temp Source: Oral (01/10 1215) BP: 150/97 mmHg (01/10 1215) Pulse Rate: 78 (01/10 1215)  Labs: No results for input(s): HGB, HCT, PLT, APTT, LABPROT, INR, HEPARINUNFRC, CREATININE, CKTOTAL, CKMB, TROPONINI in the last 72 hours.  CrCl cannot be calculated (Patient has no serum creatinine result on file.).   Medical History: Past Medical History  Diagnosis Date  . Acid reflux   . Depression   . Anxiety   . Peripheral vascular disease (HCC)   . Hemorrhoid   . NSTEMI (non-ST elevated myocardial infarction) (HCC) 12/2014  . History of kidney stones     Medications:  Infusions:  . sodium chloride    . heparin      Assessment: Tommy Malone presented with CP to start IV heparin. Baseline labs are pending but CBC from December is WNL. He is not on anticoagulation PTA.   Goal of Therapy:  Heparin level 0.3-0.7 units/ml Monitor platelets by anticoagulation protocol: Yes   Plan:  - Heparin bolus 4000 units IV x 1 - Heparin gtt 1300 units/hr (pt was therapeutic on this in December) - Check a 6 hour heparin level  - Daily heparin level and CBC  Tommy Malone, Tommy Malone 02/08/2015,1:10 PM

## 2015-02-08 NOTE — Progress Notes (Signed)
CSW order received that patient has recently lost his job and does not have insurance. He cannot afford his medication. Unit RNCM is aware of this issue. CSW notified Financial Services to assist patient with financial service assessment and hopeful assistance.  Lorri Frederickonna T. Jaci LazierCrowder, [place] [phone]

## 2015-02-09 ENCOUNTER — Other Ambulatory Visit (HOSPITAL_COMMUNITY): Payer: Self-pay

## 2015-02-09 ENCOUNTER — Inpatient Hospital Stay (HOSPITAL_COMMUNITY): Payer: Self-pay

## 2015-02-09 DIAGNOSIS — I2510 Atherosclerotic heart disease of native coronary artery without angina pectoris: Secondary | ICD-10-CM

## 2015-02-09 DIAGNOSIS — Z9861 Coronary angioplasty status: Secondary | ICD-10-CM

## 2015-02-09 DIAGNOSIS — I200 Unstable angina: Secondary | ICD-10-CM

## 2015-02-09 LAB — CBC
HCT: 47.6 % (ref 39.0–52.0)
HEMOGLOBIN: 15.6 g/dL (ref 13.0–17.0)
MCH: 29.7 pg (ref 26.0–34.0)
MCHC: 32.8 g/dL (ref 30.0–36.0)
MCV: 90.7 fL (ref 78.0–100.0)
Platelets: 171 10*3/uL (ref 150–400)
RBC: 5.25 MIL/uL (ref 4.22–5.81)
RDW: 13.7 % (ref 11.5–15.5)
WBC: 6.7 10*3/uL (ref 4.0–10.5)

## 2015-02-09 LAB — NM MYOCAR MULTI W/SPECT W/WALL MOTION / EF
CHL CUP RESTING HR STRESS: 53 {beats}/min
CSEPED: 4 min

## 2015-02-09 LAB — LIPID PANEL
Cholesterol: 171 mg/dL (ref 0–200)
HDL: 36 mg/dL — ABNORMAL LOW (ref >40)
LDL CALC: 109 mg/dL — AB (ref 0–99)
Total CHOL/HDL Ratio: 4.8 RATIO
Triglycerides: 130 mg/dL (ref <150)
VLDL: 26 mg/dL (ref 0–40)

## 2015-02-09 LAB — HEPARIN LEVEL (UNFRACTIONATED): HEPARIN UNFRACTIONATED: 0.48 [IU]/mL (ref 0.30–0.70)

## 2015-02-09 LAB — BASIC METABOLIC PANEL
Anion gap: 8 (ref 5–15)
BUN: 11 mg/dL (ref 6–20)
CO2: 25 mmol/L (ref 22–32)
Calcium: 8.7 mg/dL — ABNORMAL LOW (ref 8.9–10.3)
Chloride: 104 mmol/L (ref 101–111)
Creatinine, Ser: 1.23 mg/dL (ref 0.61–1.24)
GFR calc Af Amer: >60 mL/min (ref >60)
GFR calc non Af Amer: >60 mL/min (ref >60)
Glucose, Bld: 87 mg/dL (ref 65–99)
Potassium: 4.3 mmol/L (ref 3.5–5.1)
Sodium: 137 mmol/L (ref 135–145)

## 2015-02-09 LAB — TROPONIN I: Troponin I: <0.03 ng/mL (ref <0.031)

## 2015-02-09 MED ORDER — TECHNETIUM TC 99M SESTAMIBI GENERIC - CARDIOLITE
10.0000 | Freq: Once | INTRAVENOUS | Status: AC | PRN
  Administered 2015-02-09: 10 via INTRAVENOUS

## 2015-02-09 MED ORDER — TECHNETIUM TC 99M SESTAMIBI GENERIC - CARDIOLITE
30.0000 | Freq: Once | INTRAVENOUS | Status: AC | PRN
  Administered 2015-02-09: 30 via INTRAVENOUS

## 2015-02-09 MED ORDER — REGADENOSON 0.4 MG/5ML IV SOLN
INTRAVENOUS | Status: AC
  Administered 2015-02-09: 0.4 mg via INTRAVENOUS
  Filled 2015-02-09: qty 5

## 2015-02-09 MED ORDER — REGADENOSON 0.4 MG/5ML IV SOLN
0.4000 mg | Freq: Once | INTRAVENOUS | Status: AC
  Administered 2015-02-09: 0.4 mg via INTRAVENOUS
  Filled 2015-02-09: qty 5

## 2015-02-09 MED ORDER — NITROGLYCERIN 0.4 MG SL SUBL: 0.4000 mg | SUBLINGUAL_TABLET | SUBLINGUAL | Status: DC | PRN

## 2015-02-09 NOTE — Progress Notes (Signed)
ANTICOAGULATION CONSULT NOTE  Pharmacy Consult for heparin Indication: chest pain/ACS  Allergies  Allergen Reactions  . Simvastatin Nausea Only    Stomach aches    Patient Measurements: Height: 5\' 9"  (175.3 cm) Weight: 161 lb 4.8 oz (73.165 kg) IBW/kg (Calculated) : 70.7 Heparin Dosing Weight: 73kg  Vital Signs: Temp: 97.9 F (36.6 C) (01/11 0007) Temp Source: Oral (01/11 0007) BP: 108/61 mmHg (01/11 0007) Pulse Rate: 62 (01/11 0007)  Labs:  Recent Labs  02/08/15 1325 02/08/15 1940 02/09/15 0001 02/09/15 0418  HGB 17.1*  --   --  15.6  HCT 50.1  --   --  47.6  PLT 174  --   --  171  LABPROT 13.7  --   --   --   INR 1.03  --   --   --   HEPARINUNFRC  --  0.58  --  0.48  CREATININE 1.12  --   --  1.23  TROPONINI <0.03 <0.03 <0.03  --     Estimated Creatinine Clearance: 73.4 mL/min (by C-G formula based on Cr of 1.23).  Medications:  Infusions:  . sodium chloride 10 mL/hr at 02/08/15 1427  . heparin 1,300 Units/hr (02/08/15 1428)   Assessment: 49 yom presented with CP continues on IV heparin at 1300 units/hr. Heparin level remains therapeutic. CBC is WNL and no bleeding noted. Planning nuclear stress test.   Goal of Therapy:  Heparin level 0.3-0.7 units/ml Monitor platelets by anticoagulation protocol: Yes   Plan:  - Continue heparin gtt at 1300 units/hr - Daily heparin level and CBC  Lysle Pearlachel Tarrin Menn, PharmD, BCPS Pager # [phone] 02/09/2015 8:37 AM

## 2015-02-09 NOTE — Progress Notes (Signed)
    Subjective:  No complaints this am  Objective:  Vital Signs in the last 24 hours: Temp:  [97.6 F (36.4 C)-97.9 F (36.6 C)] 97.9 F (36.6 C) (01/11 0007) Pulse Rate:  [55-80] 55 (01/11 1106) Resp:  [17-18] 18 (01/11 0007) BP: (108-150)/(61-97) 114/70 mmHg (01/11 1106) SpO2:  [95 %-100 %] 95 % (01/11 0007) Weight:  [161 lb 4.8 oz (73.165 kg)] 161 lb 4.8 oz (73.165 kg) (01/10 1217)  Intake/Output from previous day:  Intake/Output Summary (Last 24 hours) at 02/09/15 1108 Last data filed at 02/09/15 0536  Gross per 24 hour  Intake 1245.93 ml  Output    500 ml  Net 745.93 ml    Physical Exam: General appearance: alert, cooperative and no distress Lungs: clear to auscultation bilaterally Heart: regular rate and rhythm Extremities: extremities normal, atraumatic, no cyanosis or edema   Rate: 55  Rhythm: normal sinus rhythm  Lab Results:  Recent Labs  02/08/15 1325 02/09/15 0418  WBC 9.1 6.7  HGB 17.1* 15.6  PLT 174 171    Recent Labs  02/08/15 1325 02/09/15 0418  NA 139 137  K 4.6 4.3  CL 101 104  CO2 29 25  GLUCOSE 98 87  BUN 8 11  CREATININE 1.12 1.23    Recent Labs  02/08/15 1940 02/09/15 0001  TROPONINI <0.03 <0.03    Recent Labs  02/08/15 1325  INR 1.03    Scheduled Meds: . atorvastatin  80 mg Oral q1800  . metoprolol tartrate  12.5 mg Oral BID  . nitroGLYCERIN  1 inch Topical 4 times per day  . pantoprazole  40 mg Oral Daily  . sertraline  50 mg Oral Daily  . Twilight Study Drug  1 tablet Oral BID  . [place] Study Drug  1 tablet Oral Daily   Continuous Infusions: . sodium chloride 10 mL/hr at 02/08/15 1427  . heparin 1,300 Units/hr (02/09/15 0843)   PRN Meds:.acetaminophen, ALPRAZolam, nitroGLYCERIN, ondansetron (ZOFRAN) IV, zolpidem   Imaging: Portable Chest X-ray 1 View  02/08/2015  CLINICAL DATA:  Left-sided chest pain since 01/04/2015 EXAM: PORTABLE CHEST 1 VIEW COMPARISON:  01/05/2015 FINDINGS: Nonacute posterior  right ninth rib fractures stable from prior study. Heart size and vascular pattern normal. Lungs clear. IMPRESSION: No acute findings Electronically Signed   By: Esperanza Heiraymond  Rubner M.D.   On: 02/08/2015 13:06    Cardiac Studies:  Assessment/Plan:  50 y.o. Male with CAD, s/p LAD DES 01/06/15, PVD- h/o AOBP, smoker, FM Hx of CAD- adm with chest pain.   Principal Problem:   Unstable angina (HCC) Active Problems:   CAD S/P LAD DES 01/06/15   PVD - AO BP 2014   HLD (hyperlipidemia)   Tobacco abuse   Depression   PLAN:  Myoview this am.   Tommy Malone 02/09/2015, 11:08 AM [phone]  Agree with note written by Tommy ShelterLuke Kilroy PAC  Atyp CP 1 month after LAD DES [facility]. MV neg. Enz neg. OK for DC home.   Tommy Malone, Tommy Malone 02/09/2015 2:10 PM

## 2015-02-09 NOTE — Care Management Note (Signed)
Case Management Note  Patient Details  Name: Tommy Malone MRN: [id number] Date of Birth: August 28, 1965  Subjective/Objective:    CAD S/P LAD DES 01/06/15, unstable angina                Action/Plan: NCM spoke to pt and states he uses [facility] for his medications. [facility] Pt is currently participating in the [place] trial study were he receives Brilinta for free for a year. States his father assist him with getting his medications. He lives at home with his father. He recently was not hired for a job due to medical condition. States he plans to continue to look for job.    Expected Discharge Date:  02/09/2015               Expected Discharge Plan:  Home/Self Care  In-House Referral:  NA  Discharge planning Services  CM Consult  Post Acute Care Choice:  NA Choice offered to:  NA  DME Arranged:  N/A DME Agency:  NA  HH Arranged:  NA HH Agency:  NA  Status of Service:  Completed, signed off  Medicare Important Message Given:    Date Medicare IM Given:    Medicare IM give by:    Date Additional Medicare IM Given:    Additional Medicare Important Message give by:     If discussed at Long Length of Stay Meetings, dates discussed:    Additional Comments:  Tommy Malone, Tommy Ismael Ellen, RN 02/09/2015, 4:44 PM

## 2015-02-09 NOTE — Discharge Summary (Signed)
Discharge Summary   Patient ID: Tommy Malone,  MRN: [id number], DOB/AGE: [birth year] 50 y.o.  Admit date: 02/08/2015 Discharge date: 02/09/2015  Primary Care Provider: Renford Dills Malone Primary Cardiologist: Dr. Tenny Malone  Discharge Diagnoses Principal Problem:   Unstable angina [facility]) Active Problems:   PVD - AO BP 2014   HLD (hyperlipidemia)   Tobacco abuse   CAD S/P LAD DES 01/06/15   Depression   Allergies Allergies  Allergen Reactions  . Simvastatin Nausea Only    Stomach aches    Consultant: None  Procedures: None  History of Present Illness  Tommy Malone is a 50 y.o. male with history of CAD, s/p LAD DES 01/06/15, tobacco abuse, untreated hyperlipidemia, lower extremity PAD s/p aortobifemoral bypass graft by Dr. Imogene Malone in 12/2012, and bilateral carotid artery disease who presented to clinic 02/08/15 for calling in for chest pressure.[name]   He was seen by Dr. Tenny Malone first time in December 2014 for preoperative risk evaluation prior to undergoing bilateral femoral artery bypass graft. Myoview stress tests was normal without ischemia, LVEF was normal 57%. He also had bilateral carotid artery disease with evidence of left subclavian stenosis. Carotid Doppler performed in January 2015 showed 40% right ICA stenosis and 40-59% left ICA stenosis. There was no documentation of the degree of his left subclavian stenosis.   He was admitted 01/05/15 to 01/07/15 for NSTEMI. Cardiac catheterization was performed on 01/06/15 which showed 99% proximal LAD lesion treated with DES (Xience 3.5 x 33 mm DES), 60% D2 stenosis, 70% proximal to mid LAD lesion covered with same stent, 50% mid RCA lesion, 30% distal RCA lesion, EF 45-50%. Postprocedure, he was enrolled in [place] trial and placed on aspirin and Brilinta. Echocardiogram was obtained on 12/9 which showed EF 55-60%, biplane LVEF 67% by speckle tracking, not all myocardial segments were well visualized, normal diastolic function, unable to assess PASP.    Pt called 02/08/15  in asking to be seen for chest pain. Occurs 2-3 times a day and last 20-30 min. No associated symptoms. Pain has some similar qualities to prior pain but he has not had the burning that he had prior to his cath. He has some SOB but that is much improved since his PCI. He is under a lot of stress and is very depressed because he lost his job. He is on ASA and brilinta through twilight study. He is on BB but not on lipids due to financial issues. He complains of depression. But no suicidal ideations. He has taken zoloft in the past. EKG shows no ischemia.    Hospital Course  The patient was admitted directly for tele observation and stated on IV heparin. Trop x 3 negative. LDL 109. TSH normal. EKG non ischemic. Myoview read by Tommy Malone and it was negative (final reading pending). No further chest pain.   She has been seen by Tommy Malone today and deemed ready for discharge home. All follow-up appointments have been scheduled. Discharge medications are listed below.   Continue to smoke <1ppd, but willing to quit. Plan to start Chantix when he can afford. he plans to restart high dose lipitor as soon as his next paycheck, discussed correlation between untreated hyperlipidemia and CAD. F/u with Dr. Tenny Malone as scheduled in March, sooner if needed. The patient can not effort SL nitro. The patient's PCP is Dr. Nehemiah Malone. Spoken with Care manger if we can establish care with our [facility] and medication programs.    Discharge Vitals Blood pressure 130/79, pulse  89, temperature 97.9 F (36.6 C), temperature source Oral, resp. rate 18, height 5\' 9"  (1.753 m), weight 161 lb 4.8 oz (73.165 kg), SpO2 95 %.  Filed Weights   02/08/15 1217  Weight: 161 lb 4.8 oz (73.165 kg)    Labs  CBC  Recent Labs  02/08/15 1325 02/09/15 0418  WBC 9.1 6.7  NEUTROABS 6.3  --   HGB 17.1* 15.6  HCT 50.1 47.6  MCV 90.3 90.7  PLT 174 171   Basic Metabolic Panel  Recent Labs   16/10/9599/10/17 1325 02/09/15 0418  NA 139 137  K 4.6 4.3  CL 101 104  CO2 29 25  GLUCOSE 98 87  BUN 8 11  CREATININE 1.12 1.23  CALCIUM 9.1 8.7*  MG 2.1  --    Liver Function Tests  Recent Labs  02/08/15 1325  AST 19  ALT 18  ALKPHOS 79  BILITOT 0.8  PROT 5.9*  ALBUMIN 3.5   No results for input(s): LIPASE, AMYLASE in the last 72 hours. Cardiac Enzymes  Recent Labs  02/08/15 1325 02/08/15 1940 02/09/15 0001  TROPONINI <0.03 <0.03 <0.03   Fasting Lipid Panel  Recent Labs  02/09/15 0418  CHOL 171  HDL 36*  LDLCALC 109*  TRIG 130  CHOLHDL 4.8   Thyroid Function Tests  Recent Labs  02/08/15 1325  TSH 1.327    Disposition  Pt is being discharged home today in good condition.  Follow-up Plans & Appointments  Follow-up Information    Follow up with Tommy Malone,Tommy Malone.   Specialty:  Internal Medicine   Why:  Left message for Dr. to call pt to set up hospital follow up appt   Contact information:   [address] [place] [address] [phone]       Follow up with Tommy PatesPaula Ross, Malone. Go on 04/29/2015.   Specialty:  Cardiology   Why:  @11 :30 am for routine visit. Sooner if needed   Contact information:   [address] [place] [address] [phone]           Discharge Instructions    Diet - low sodium heart healthy    Complete by:  As directed      Increase activity slowly    Complete by:  As directed          F/u Labs/Studies  Discharge Medications    Medication List    TAKE these medications        AMBULATORY NON FORMULARY MEDICATION  Take 90 mg by mouth 2 (two) times daily. Medication Name: Brilinta 90 mg BID ([employer] [employer] Provided do not fill)     AMBULATORY NON FORMULARY MEDICATION  Take 81 mg by mouth daily. Medication Name: ASA 81 mg daily ([employer] Study provided)     atorvastatin 80 MG tablet  Commonly known as:  LIPITOR  Take 1 tablet (80 mg total) by mouth daily at 6  PM.     ibuprofen 200 MG tablet  Commonly known as:  ADVIL,MOTRIN  Take 400-600 mg by mouth every 6 (six) hours as needed for headache (pain).     metoprolol tartrate 25 MG tablet  Commonly known as:  LOPRESSOR  Take 0.5 tablets (12.5 mg total) by mouth 2 (two) times daily.     nitroGLYCERIN 0.4 MG SL tablet  Commonly known as:  NITROSTAT  Place 1 tablet (0.4 mg total) under the tongue every 5 (five) minutes as needed for chest pain (x  3 doses).     varenicline 0.5 MG X 11 & 1 MG X 42 tablet  Commonly known as:  CHANTIX STARTING MONTH PAK  Take one 0.5 mg tablet by mouth once daily for 3 days, then increase to one 0.5 mg tablet twice daily for 4 days, then increase to one 1 mg tablet twice daily.        Duration of Discharge Encounter   Greater than 30 minutes including physician time.  Signed, Zelene Barga PA-C 02/09/2015, 3:26 PM

## 2015-02-11 ENCOUNTER — Encounter: Payer: Self-pay | Admitting: *Deleted

## 2015-02-11 DIAGNOSIS — Z006 Encounter for examination for normal comparison and control in clinical research program: Secondary | ICD-10-CM

## 2015-02-11 NOTE — Progress Notes (Signed)
[employer] phone cal to schedule 3 month Randomization  follow up appointment between 03/25/15 -04/21/15 . Informed patients girlfriend and she will tell patient to call me. Patient is doing better after discharge from hospital.

## 2015-03-31 NOTE — Progress Notes (Signed)
Cardiology Office Note   Date:  04/01/2015   ID:  Tommy Malone, Tommy Malone 02-25-1965, MRN [id number]  PCP:  Katy Apo, MD  Cardiologist:   Dietrich Pates, MD   F/U of CAD      History of Present Illness: Tommy Malone is a 50 y.o. male with a history ofpast history of tobacco abuse, untreated hyperlipidemia, lower extremity PAD s/p aortobifemoral bypass graft by Dr. Imogene Burn in 12/2012, and bilateral carotid artery disease . Myoview stress test in 2014 was normal without ischemia, LVEF was normal 57%. He also had bilateral carotid artery disease with evidence of left subclavian stenosis. Carotid Doppler performed in January 2015 showed 40% right ICA stenosis and 40-59% left ICA stenosis. There was no documentation of the degree of his left subclavian stenosis.   Cardiac catheterization was performed on 12/18 which showed 99% proximal LAD lesion treated with DES (Xience 3.5 x 33 mm DES), 60% D2 stenosis, 70% proximal to mid LAD lesion covered with same stent, 50% mid RCA lesion, 30% distal RCA lesion, EF 45-50%. Postprocedure, he was enrolled in [place] trial and placed on aspirin and Brilinta. Echocardiogram was obtained on 12/9 which showed EF 55-60%, biplane LVEF 67% by speckle tracking, not all myocardial segments were well visualized, normal diastolic function, unable to assess PASP.    No CP  Breathing is OK  Having a lot of hip and leg pain  [place] Still smoking about 1ppd    Outpatient Prescriptions Prior to Visit  Medication Sig Dispense Refill  . AMBULATORY NON FORMULARY MEDICATION Take 90 mg by mouth 2 (two) times daily. Medication Name: Brilinta 90 mg BID (TWILIGHT Research Provided do not fill)    . AMBULATORY NON FORMULARY MEDICATION Take 81 mg by mouth daily. Medication Name: ASA 81 mg daily (TWILIGHT Research Study provided)    . atorvastatin (LIPITOR) 80 MG tablet Take 1 tablet (80 mg total) by mouth daily at 6 PM. 30 tablet 5  . ibuprofen (ADVIL,MOTRIN) 200 MG tablet  Take 400-600 mg by mouth every 6 (six) hours as needed for headache (pain).    . metoprolol tartrate (LOPRESSOR) 25 MG tablet Take 0.5 tablets (12.5 mg total) by mouth 2 (two) times daily. 60 tablet 5  . nitroGLYCERIN (NITROSTAT) 0.4 MG SL tablet Place 1 tablet (0.4 mg total) under the tongue every 5 (five) minutes as needed for chest pain (x 3 doses). 25 tablet 2  . varenicline (CHANTIX STARTING MONTH PAK) 0.5 MG X 11 & 1 MG X 42 tablet Take one 0.5 mg tablet by mouth once daily for 3 days, then increase to one 0.5 mg tablet twice daily for 4 days, then increase to one 1 mg tablet twice daily. (Patient not taking: Reported on 04/01/2015) 53 tablet 0   No facility-administered medications prior to visit.     Allergies:   Simvastatin   Past Medical History  Diagnosis Date  . Acid reflux   . Depression   . Anxiety   . Peripheral vascular disease (HCC)   . Hemorrhoid   . NSTEMI (non-ST elevated myocardial infarction) (HCC) 12/2014  . History of kidney stones     Past Surgical History  Procedure Laterality Date  . Elbow surgery Bilateral   . Kidney stone surgery      x2  . Aorta - bilateral femoral artery bypass graft N/A 01/07/2013    Procedure: AORTA BIFEMORAL BYPASS GRAFT;  Surgeon: Fransisco Hertz, MD;  Location: [facility] OR;  Service: Vascular;  Laterality:  N/A;  . Abdominal aortagram N/A 12/18/2012    Procedure: ABDOMINAL AORTAGRAM;  Surgeon: Fransisco Hertz, MD;  Location: [facility] CATH LAB;  Service: Cardiovascular;  Laterality: N/A;  . Cardiac catheterization N/A 01/06/2015    Procedure: Left Heart Cath and Coronary Angiography;  Surgeon: Kathleene Hazel, MD;  Location: [facility] INVASIVE CV LAB;  Service: Cardiovascular;  Laterality: N/A;  . Cardiac catheterization N/A 01/06/2015    Procedure: Coronary Stent Intervention;  Surgeon: Kathleene Hazel, MD;  Location: [facility] INVASIVE CV LAB;  Service: Cardiovascular;  Laterality: N/A;     Social History:  The patient  reports that he has been  smoking Cigarettes and E-cigarettes.  He has a 30 pack-year smoking history. He has never used smokeless tobacco. He reports that he does not drink alcohol or use illicit drugs.   Family History:  The patient's family history includes Cancer in his paternal grandfather; Heart attack in his father and mother; Heart disease in his father, maternal grandmother, and mother; Hyperlipidemia in his father and mother.    ROS:  Please see the history of present illness. All other systems are reviewed and  Negative to the above problem except as noted.    PHYSICAL EXAM: VS:  BP 126/60 mmHg  Pulse 57  Ht  (1.753 m)  Wt 166 lb 6.4 oz (75.479 kg)  BMI 24.56 kg/m2  GEN: Well nourished, well developed, in no acute distress HEENT: normal Neck: no JVD, carotid bruits, or masses Cardiac: RRR; no murmurs, rubs, or gallops,no edema  Respiratory:  clear to auscultation bilaterally, normal work of breathing GI: soft, nontender, nondistended, + BS  No hepatomegaly  MS: no deformity Moving all extremities   Skin: warm and dry, no rash Neuro:  Strength and sensation are intact Psych: euthymic mood, full affect   EKG:  EKG is not done    Lipid Panel    Component Value Date/Time   CHOL 171 02/09/2015 0418   TRIG 130 02/09/2015 0418   HDL 36* 02/09/2015 0418   CHOLHDL 4.8 02/09/2015 0418   VLDL 26 02/09/2015 0418   LDLCALC 109* 02/09/2015 0418      Wt Readings from Last 3 Encounters:  04/01/15 166 lb 6.4 oz (75.479 kg)  02/08/15 161 lb 4.8 oz (73.165 kg)  02/08/15 167 lb 12.8 oz (76.114 kg)      ASSESSMENT AND PLAN:  1  CAD No symptoms to suggest angina  Keep on same meds  Pt has ont been in cardiac rehab  Try to enroll  2.  PVOD  Will set up for LE dopplers    3.  HL  Keep on statin    4.  Tobacco      Disposition:   FU with me in 4 to 5 months    Signed, Dietrich Pates, MD  04/01/2015 9:59 AM    [facility] [address] [place], [place], [place]  [address] Phone:  [phone]; Fax: [phone]

## 2015-04-01 ENCOUNTER — Encounter: Payer: Self-pay | Admitting: Internal Medicine

## 2015-04-01 ENCOUNTER — Ambulatory Visit (INDEPENDENT_AMBULATORY_CARE_PROVIDER_SITE_OTHER): Payer: Self-pay | Admitting: Internal Medicine

## 2015-04-01 VITALS — BP 126/60 | HR 57 | Ht 69.0 in | Wt 166.4 lb

## 2015-04-01 DIAGNOSIS — I739 Peripheral vascular disease, unspecified: Secondary | ICD-10-CM

## 2015-04-01 DIAGNOSIS — I214 Non-ST elevation (NSTEMI) myocardial infarction: Secondary | ICD-10-CM

## 2015-04-01 NOTE — Patient Instructions (Signed)
Your physician recommends that you continue on your current medications as directed. Please refer to the Current Medication list given to you today.  Your physician has requested that you have an ankle brachial index (ABI). During this test an ultrasound and blood pressure cuff are used to evaluate the arteries that supply the arms and legs with blood. Allow thirty minutes for this exam. There are no restrictions or special instructions.  You have been referred to Cardiac Rehabilitation  Your physician wants you to follow-up in: 4-5 months with Dr. Tenny Crawoss.  You will receive a reminder letter in the mail two months in advance. If you don't receive a letter, please call our office to schedule the follow-up appointment.  Please ask at checkout about applying for an [employer].

## 2015-04-04 ENCOUNTER — Other Ambulatory Visit: Payer: Self-pay | Admitting: Internal Medicine

## 2015-04-04 DIAGNOSIS — I214 Non-ST elevation (NSTEMI) myocardial infarction: Secondary | ICD-10-CM

## 2015-04-04 DIAGNOSIS — I739 Peripheral vascular disease, unspecified: Secondary | ICD-10-CM

## 2015-04-05 ENCOUNTER — Ambulatory Visit (HOSPITAL_COMMUNITY): Admission: RE | Admit: 2015-04-05 | Payer: Self-pay | Source: Ambulatory Visit

## 2015-04-05 ENCOUNTER — Other Ambulatory Visit: Payer: Self-pay | Admitting: *Deleted

## 2015-04-05 ENCOUNTER — Encounter: Payer: Self-pay | Admitting: *Deleted

## 2015-04-05 DIAGNOSIS — Z006 Encounter for examination for normal comparison and control in clinical research program: Secondary | ICD-10-CM

## 2015-04-05 MED ORDER — AMBULATORY NON FORMULARY MEDICATION: 81.0000 mg | Freq: Every day | Status: DC

## 2015-04-05 NOTE — Progress Notes (Signed)
TWILIGHT Research Study 3 month Randomization visit completed. Patient denies any problems since admission for the Angina. Denies any bleeding episodes and states he has been compliant with ASA & Brilinta. Today he is being randomized to receive ASA 81mg  daily or PLACEBO. Patient forgot to return his study medication and will do at next visit or drop off. He verbalizes understanding not being on open label ASA while in study.  Patient was given a copy of his research visit window. Bottle #'s [id number]; [id number]; [id number]; [id number]; [id number] dispensed with education. Questions encouraged and answered.

## 2015-04-11 ENCOUNTER — Ambulatory Visit (HOSPITAL_COMMUNITY)
Admission: RE | Admit: 2015-04-11 | Discharge: 2015-04-11 | Disposition: A | Discharge status: Home / Self Care | Payer: Self-pay | Source: Ambulatory Visit | Attending: Cardiovascular Disease | Admitting: Cardiovascular Disease

## 2015-04-11 DIAGNOSIS — R938 Abnormal findings on diagnostic imaging of other specified body structures: Secondary | ICD-10-CM | POA: Insufficient documentation

## 2015-04-11 DIAGNOSIS — I739 Peripheral vascular disease, unspecified: Secondary | ICD-10-CM | POA: Insufficient documentation

## 2015-04-11 DIAGNOSIS — I214 Non-ST elevation (NSTEMI) myocardial infarction: Secondary | ICD-10-CM | POA: Insufficient documentation

## 2015-04-29 ENCOUNTER — Ambulatory Visit: Payer: Self-pay | Admitting: Internal Medicine

## 2015-05-10 ENCOUNTER — Encounter: Payer: Self-pay | Admitting: *Deleted

## 2015-05-10 ENCOUNTER — Telehealth: Payer: Self-pay | Admitting: *Deleted

## 2015-05-10 DIAGNOSIS — Z006 Encounter for examination for normal comparison and control in clinical research program: Secondary | ICD-10-CM

## 2015-05-10 NOTE — Telephone Encounter (Signed)
Left message for patient to call Research office for month 4 TWILIGHT Research study telephone follow up visit.

## 2015-05-10 NOTE — Progress Notes (Signed)
TWILIGHT Research study 4 month telephone follow up completed. Patient denies any adverse or bleeding events. States compliant with medication. Questions encouraged and answered.

## 2015-07-02 ENCOUNTER — Emergency Department (HOSPITAL_COMMUNITY): Payer: Self-pay

## 2015-07-02 ENCOUNTER — Encounter (HOSPITAL_COMMUNITY): Payer: Self-pay | Admitting: Emergency Medicine

## 2015-07-02 ENCOUNTER — Encounter (HOSPITAL_COMMUNITY)
Admission: EM | Disposition: A | Discharge status: Home / Self Care | Payer: Self-pay | Source: Home / Self Care | Attending: Cardiology

## 2015-07-02 ENCOUNTER — Inpatient Hospital Stay (HOSPITAL_COMMUNITY)
Admission: EM | Admit: 2015-07-02 | Discharge: 2015-07-05 | DRG: 251 | Disposition: A | Discharge status: Home / Self Care | Payer: Self-pay | Attending: Cardiology | Admitting: Cardiology

## 2015-07-02 DIAGNOSIS — F17200 Nicotine dependence, unspecified, uncomplicated: Secondary | ICD-10-CM | POA: Diagnosis present

## 2015-07-02 DIAGNOSIS — E785 Hyperlipidemia, unspecified: Secondary | ICD-10-CM | POA: Diagnosis present

## 2015-07-02 DIAGNOSIS — F17210 Nicotine dependence, cigarettes, uncomplicated: Secondary | ICD-10-CM | POA: Diagnosis present

## 2015-07-02 DIAGNOSIS — I739 Peripheral vascular disease, unspecified: Secondary | ICD-10-CM | POA: Diagnosis present

## 2015-07-02 DIAGNOSIS — I252 Old myocardial infarction: Secondary | ICD-10-CM

## 2015-07-02 DIAGNOSIS — I2129 ST elevation (STEMI) myocardial infarction involving other sites: Secondary | ICD-10-CM

## 2015-07-02 DIAGNOSIS — I2510 Atherosclerotic heart disease of native coronary artery without angina pectoris: Secondary | ICD-10-CM

## 2015-07-02 DIAGNOSIS — Z9114 Patient's other noncompliance with medication regimen: Secondary | ICD-10-CM

## 2015-07-02 DIAGNOSIS — I2119 ST elevation (STEMI) myocardial infarction involving other coronary artery of inferior wall: Principal | ICD-10-CM | POA: Diagnosis present

## 2015-07-02 DIAGNOSIS — Z7982 Long term (current) use of aspirin: Secondary | ICD-10-CM

## 2015-07-02 DIAGNOSIS — T82855A Stenosis of coronary artery stent, initial encounter: Secondary | ICD-10-CM | POA: Diagnosis present

## 2015-07-02 DIAGNOSIS — Z599 Problem related to housing and economic circumstances, unspecified: Secondary | ICD-10-CM

## 2015-07-02 DIAGNOSIS — I10 Essential (primary) hypertension: Secondary | ICD-10-CM | POA: Diagnosis present

## 2015-07-02 DIAGNOSIS — I228 Subsequent ST elevation (STEMI) myocardial infarction of other sites: Secondary | ICD-10-CM | POA: Diagnosis present

## 2015-07-02 DIAGNOSIS — I213 ST elevation (STEMI) myocardial infarction of unspecified site: Secondary | ICD-10-CM

## 2015-07-02 DIAGNOSIS — I25119 Atherosclerotic heart disease of native coronary artery with unspecified angina pectoris: Secondary | ICD-10-CM | POA: Diagnosis present

## 2015-07-02 DIAGNOSIS — Y713 Surgical instruments, materials and cardiovascular devices (including sutures) associated with adverse incidents: Secondary | ICD-10-CM | POA: Diagnosis present

## 2015-07-02 DIAGNOSIS — Z9861 Coronary angioplasty status: Secondary | ICD-10-CM

## 2015-07-02 DIAGNOSIS — I70219 Atherosclerosis of native arteries of extremities with intermittent claudication, unspecified extremity: Secondary | ICD-10-CM | POA: Diagnosis present

## 2015-07-02 DIAGNOSIS — Z720 Tobacco use: Secondary | ICD-10-CM | POA: Diagnosis present

## 2015-07-02 HISTORY — DX: Coronary angioplasty status: Z98.61

## 2015-07-02 HISTORY — PX: CARDIAC CATHETERIZATION: SHX172

## 2015-07-02 HISTORY — DX: Atherosclerotic heart disease of native coronary artery without angina pectoris: I25.10

## 2015-07-02 LAB — CBC WITH DIFFERENTIAL/PLATELET
Basophils Absolute: 0 10*3/uL (ref 0.0–0.1)
Basophils Relative: 0 %
EOS ABS: 0.2 10*3/uL (ref 0.0–0.7)
EOS PCT: 2 %
HCT: 48.2 % (ref 39.0–52.0)
HEMOGLOBIN: 16 g/dL (ref 13.0–17.0)
LYMPHS ABS: 3 10*3/uL (ref 0.7–4.0)
LYMPHS PCT: 28 %
MCH: 30 pg (ref 26.0–34.0)
MCHC: 33.2 g/dL (ref 30.0–36.0)
MCV: 90.3 fL (ref 78.0–100.0)
MONOS PCT: 7 %
Monocytes Absolute: 0.7 10*3/uL (ref 0.1–1.0)
Neutro Abs: 6.6 10*3/uL (ref 1.7–7.7)
Neutrophils Relative %: 63 %
PLATELETS: 187 10*3/uL (ref 150–400)
RBC: 5.34 MIL/uL (ref 4.22–5.81)
RDW: 12.9 % (ref 11.5–15.5)
WBC: 10.5 10*3/uL (ref 4.0–10.5)

## 2015-07-02 SURGERY — LEFT HEART CATH AND CORONARY ANGIOGRAPHY
Anesthesia: LOCAL

## 2015-07-02 MED ORDER — HEPARIN SODIUM (PORCINE) 5000 UNIT/ML IJ SOLN
5000.0000 [IU] | Freq: Once | INTRAMUSCULAR | Status: AC
  Administered 2015-07-02: 5000 [IU] via INTRAVENOUS

## 2015-07-02 MED ORDER — MORPHINE SULFATE (PF) 2 MG/ML IV SOLN
2.0000 mg | Freq: Once | INTRAVENOUS | Status: AC
  Administered 2015-07-02: 2 mg via INTRAVENOUS

## 2015-07-02 MED ORDER — MORPHINE SULFATE (PF) 2 MG/ML IV SOLN
INTRAVENOUS | Status: AC
  Filled 2015-07-02: qty 2

## 2015-07-02 MED ORDER — NITROGLYCERIN IN D5W 200-5 MCG/ML-% IV SOLN
10.0000 ug/min | INTRAVENOUS | Status: DC
  Administered 2015-07-02: 10 ug/min via INTRAVENOUS
  Filled 2015-07-02: qty 250

## 2015-07-02 MED ORDER — MORPHINE SULFATE (PF) 4 MG/ML IV SOLN: 4.0000 mg | Freq: Once | INTRAVENOUS | Status: DC

## 2015-07-02 SURGICAL SUPPLY — 24 items
BALLN EMERGE MR 2.0X12 (BALLOONS) ×2
BALLN EMERGE MR 2.0X15 (BALLOONS) ×2
BALLN EUPHORA RX 1.5X12 (BALLOONS) ×2
BALLN ~~LOC~~ EMERGE MR 3.5X12 (BALLOONS) ×2
BALLOON EMERGE MR 2.0X12 (BALLOONS) ×1 IMPLANT
BALLOON EMERGE MR 2.0X15 (BALLOONS) ×1 IMPLANT
BALLOON EUPHORA RX 1.5X12 (BALLOONS) ×1 IMPLANT
BALLOON ~~LOC~~ EMERGE MR 3.5X12 (BALLOONS) ×1 IMPLANT
CATH INFINITI 5FR MULTPACK ANG (CATHETERS) ×2 IMPLANT
CATH VISTA GUIDE 6FR XBLAD3.5 (CATHETERS) ×2 IMPLANT
COVER PRB 48X5XTLSCP FOLD TPE (BAG) ×2 IMPLANT
COVER PROBE 5X48 (BAG) ×2
DEVICE RAD COMP TR BAND LRG (VASCULAR PRODUCTS) ×2 IMPLANT
GLIDESHEATH SLEND A-KIT 6F 22G (SHEATH) ×2 IMPLANT
KIT ENCORE 26 ADVANTAGE (KITS) ×2 IMPLANT
KIT HEART LEFT (KITS) ×2 IMPLANT
PACK CARDIAC CATHETERIZATION (CUSTOM PROCEDURE TRAY) ×2 IMPLANT
SHEATH PINNACLE 6F 10CM (SHEATH) ×2 IMPLANT
TRANSDUCER W/STOPCOCK (MISCELLANEOUS) ×2 IMPLANT
TUBING CIL FLEX 10 FLL-RA (TUBING) ×2 IMPLANT
WIRE ASAHI PROWATER 180CM (WIRE) ×2 IMPLANT
WIRE EMERALD 3MM-J .035X150CM (WIRE) ×2 IMPLANT
WIRE HI TORQ VERSACORE-J 145CM (WIRE) ×2 IMPLANT
WIRE SAFE-T 1.5MM-J .035X260CM (WIRE) ×2 IMPLANT

## 2015-07-02 NOTE — ED Notes (Signed)
Pt had MI in dec, stent was placed. Pt compliant w all meds. Started having CP at work around 2 hrs ago. Burning/tightness. Rated 8/10. 324 ASA, 2 NTG with no relief. Pt diaphoretic.

## 2015-07-02 NOTE — ED Provider Notes (Signed)
CSN: [id number]     Arrival date & time 07/02/15  2334 History   By signing my name below, I, Marisue Humble, attest that this documentation has been prepared under the direction and in the presence of Richardean Canal, MD . Electronically Signed: Marisue Humble, Scribe. 07/02/2015. 11:58 PM.   Chief Complaint  Patient presents with  . Code STEMI   The history is provided by the patient and the EMS personnel. No language interpreter was used.   HPI Comments:  Tommy Malone. is a 50 y.o. male with PMHx of acid reflux and NSTEMI who presents to the Emergency Department complaining of 6/10 substernal, non-radiating burning chest pain, tightness and pressure onset ~1.5 hours ago while carrying heavy items at work. Pt had an MI in December with stent placement. Pt states current pain feels similar to prior MI. He reports associated diaphoresis onset with chest pain. He took 324 of Aspirin and 2 doses of NTG prior to EMS arrival. Pain still 6/10 now. Per EMS pt was 98% on room air.   Past Medical History  Diagnosis Date  . Acid reflux   . Depression   . Anxiety   . Peripheral vascular disease (HCC)   . Hemorrhoid   . NSTEMI (non-ST elevated myocardial infarction) (HCC) 12/2014  . CAD S/P percutaneous coronary angioplasty 01/06/2015    a. NSTEMI - Xience DES 3.5 x 33 p-mLAD; 60% D1, 50% mRCA  . History of kidney stones    Past Surgical History  Procedure Laterality Date  . Elbow surgery Bilateral   . Kidney stone surgery      x2  . Aorta - bilateral femoral artery bypass graft N/A 01/07/2013    Procedure: AORTA BIFEMORAL BYPASS GRAFT;  Surgeon: Fransisco Hertz, MD;  Location: [facility] OR;  Service: Vascular;  Laterality: N/A;  . Abdominal aortagram N/A 12/18/2012    Procedure: ABDOMINAL AORTAGRAM;  Surgeon: Fransisco Hertz, MD;  [facility] CATH LAB;  Service: Cardiovascular;  Laterality: N/A;  . Cardiac catheterization N/A 01/06/2015    Procedure: Left Heart Cath and Coronary Angiography;  Surgeon:  Kathleene Hazel, MD;  Location: [facility] INVASIVE CV LAB;  Service: Cardiovascular;  Laterality: N/A;  . Cardiac catheterization N/A 01/06/2015    Procedure: Coronary Stent Intervention;  Surgeon: Kathleene Hazel, MD;  Location: [facility] INVASIVE CV LAB;  Service: Cardiovascular: Xience DES 3.5 mm x 33 mm p-mLAD   Family History  Problem Relation Age of Onset  . Heart disease Mother     NOT  before age 71  . Heart attack Mother   . Hyperlipidemia Mother   . Heart disease Maternal Grandmother   . Cancer Paternal Grandfather   . Heart disease Father     NOT  before age 6  . Heart attack Father   . Hyperlipidemia Father    Social History  Substance Use Topics  . Smoking status: Light Tobacco Smoker -- 1.00 packs/day for 30 years    Types: Cigarettes, E-cigarettes  . Smokeless tobacco: Never Used  . Alcohol Use: No     Comment: occasional    Review of Systems  Constitutional: Positive for diaphoresis.  Cardiovascular: Positive for chest pain.  All other systems reviewed and are negative.     Allergies  Simvastatin  Home Medications   Prior to Admission medications   Medication Sig Start Date End Date Taking? Authorizing Provider  AMBULATORY NON FORMULARY MEDICATION Take 90 mg by mouth 2 (two) times daily. Medication Name:  Brilinta 90 mg BID (TWILIGHT Research Provided do not fill) 01/07/15  Yes Kathleene Hazelhristopher D McAlhany, MD  AMBULATORY NON FORMULARY MEDICATION Take 81 mg by mouth daily. Medication Name: ASA 81 mg or PLACEBO Daily (TWILIGHT Research study provided) 04/05/15  Yes Kathleene Hazelhristopher D McAlhany, MD  aspirin EC 81 MG tablet Take 324 mg by mouth once.   Yes Historical Provider, MD  atorvastatin (LIPITOR) 80 MG tablet Take 1 tablet (80 mg total) by mouth daily at 6 PM. 01/07/15  Yes Brittainy Sherlynn CarbonM Simmons, PA-C  ibuprofen (ADVIL,MOTRIN) 200 MG tablet Take 400-600 mg by mouth every 6 (six) hours as needed for headache (pain).   Yes Historical Provider, MD  metoprolol tartrate  (LOPRESSOR) 25 MG tablet Take 0.5 tablets (12.5 mg total) by mouth 2 (two) times daily. 01/07/15  Yes Brittainy Sherlynn CarbonM Simmons, PA-C  nitroGLYCERIN (NITROSTAT) 0.4 MG SL tablet Place 1 tablet (0.4 mg total) under the tongue every 5 (five) minutes as needed for chest pain (x 3 doses). 02/09/15  Yes Bhavinkumar Bhagat, PA  varenicline (CHANTIX STARTING MONTH PAK) 0.5 MG X 11 & 1 MG X 42 tablet Take one 0.5 mg tablet by mouth once daily for 3 days, then increase to one 0.5 mg tablet twice daily for 4 days, then increase to one 1 mg tablet twice daily. Patient not taking: Reported on 04/01/2015 01/07/15   Brittainy M Simmons, PA-C   BP 143/94 mmHg  Pulse 47  Temp(Src) 97.7 F (36.5 C) (Oral)  Resp 12  Ht 5\' 9"  (1.753 m)  Wt 170 lb (77.111 kg)  BMI 25.09 kg/m2  SpO2 100%   Physical Exam  Constitutional: He appears well-developed and well-nourished. No distress.  HENT:  Head: Normocephalic and atraumatic.  Mouth/Throat: Oropharynx is clear and moist. No oropharyngeal exudate.  Eyes: Conjunctivae and EOM are normal. Pupils are equal, round, and reactive to light. Right eye exhibits no discharge. Left eye exhibits no discharge. No scleral icterus.  Neck: Normal range of motion. Neck supple. No JVD present. No thyromegaly present.  Cardiovascular: Regular rhythm, normal heart sounds and intact distal pulses.  Bradycardia present.  Exam reveals no gallop and no friction rub.   No murmur heard. Pulmonary/Chest: Effort normal and breath sounds normal. No respiratory distress. He has no wheezes. He has no rales.  Abdominal: Soft. Bowel sounds are normal. He exhibits no distension and no mass. There is no tenderness.  Musculoskeletal: Normal range of motion. He exhibits no edema or tenderness.  Lymphadenopathy:    He has no cervical adenopathy.  Neurological: He is alert. Coordination normal.  Skin: Skin is warm and dry. No rash noted. No erythema.  Psychiatric: He has a normal mood and affect. His behavior  is normal.  Nursing note and vitals reviewed.   ED Course  Procedures  DIAGNOSTIC STUDIES:  Oxygen Saturation is 100% on RA, normal by my interpretation.    COORDINATION OF CARE:  11:35 PM Discussed treatment plan with pt at bedside and pt agreed to plan.  CRITICAL CARE Performed by: Silverio LayYAO, Julee Stoll   Total critical care time: 30  minutes  Critical care time was exclusive of separately billable procedures and treating other patients.  Critical care was necessary to treat or prevent imminent or life-threatening deterioration.  Critical care was time spent personally by me on the following activities: development of treatment plan with patient and/or surrogate as well as nursing, discussions with consultants, evaluation of patient's response to treatment, examination of patient, obtaining history from patient or surrogate,  ordering and performing treatments and interventions, ordering and review of laboratory studies, ordering and review of radiographic studies, pulse oximetry and re-evaluation of patient's condition.   Labs Review Labs Reviewed  CBC WITH DIFFERENTIAL/PLATELET  COMPREHENSIVE METABOLIC PANEL  I-STAT TROPOININ, ED    Imaging Review No results found. I have personally reviewed and evaluated these images and lab results as part of my medical decision-making.   EKG Interpretation   Date/Time:  Saturday July 02 2015 23:36:12 EDT Ventricular Rate:  50 PR Interval:  143 QRS Duration: 107 QT Interval:  450 QTC Calculation: 410 R Axis:   -32 Text Interpretation:  Sinus rhythm Left axis deviation RSR' in V1 or V2,  probably normal variant Probable anteroseptal infarct, old Borderline ST  elevation, lateral leads STEMI new since previous  Confirmed by Keionte Swicegood  MD,  Jalayia Bagheri ([id number]) on 07/02/2015 11:57:22 PM      MDM   Final diagnoses:  CAD S/P percutaneous coronary angioplasty  ST elevation myocardial infarction (STEMI), unspecified artery (HCC)   Tommy Capers. is  a 50 y.o. male here with chest pain. EMS EKG showed possible STEMI inferior leads so I asked them to activate code STEMI. Repeat EKG showed STEMI inferior leads in the ED. Still has pain. Dr. Herbie Baltimore and Dr. Mayford Knife at bedside. STEMI is new since previous. Patient already took ASA and nitro. Will start nitro drip and heparin. Cardiology to cath emergently.   I personally performed the services described in this documentation, which was scribed in my presence. The recorded information has been reviewed and is accurate.    Richardean Canal, MD 07/03/15 0000

## 2015-07-02 NOTE — ED Notes (Signed)
To Cath lab with Dr Herbie BaltimoreHarding

## 2015-07-02 NOTE — ED Notes (Signed)
Family at bedside. 

## 2015-07-02 NOTE — H&P (Signed)
Admit date: 07/02/2015 Referring Physician: Dr. Silverio LayYao Primary Cardiologist: Dr. Dietrich PatesPaula Ross Chief complaint/reason for admission:STEMI  HPI: This is a 50 y.o. male with a history of tobacco abuse ongoing, untreated hyperlipidemia, lower extremity PAD s/p aortobifemoral bypass graft by Dr. Imogene Burnhen in 12/2012, and bilateral carotid artery disease . Myoview stress test in 2014 was normal without ischemia, LVEF was normal 57%. He also has bilateral carotid artery disease with evidence of left subclavian stenosis. Carotid Doppler performed in January 2015 showed 40% right ICA stenosis and 40-59% left ICA stenosis. There was no documentation of the degree of his left subclavian stenosis.   He presented with CP and NSTEMI in 12/2014 and Cardiac catheterization  showed 99% proximal LAD lesion treated with DES (Xience 3.5 x 33 mm DES), 60% D2 stenosis, 70% proximal to mid LAD lesion covered with same stent, 50% mid RCA lesion, 30% distal RCA lesion, EF 45-50%. Postprocedure, he was [place] in [place] trial and placed on aspirin and Brilinta. Echocardiogram was obtained on 12/9 which showed EF 55-60%.  He has been doing well since then with no complaints of angina until this evening.  He says that he was getting ready to finish work and was cleaning up.  He has sudden onset of SSCP with no radiation but associated with severe diaphoresis and SOB.  Pain was identical to his prior MI.  He took a SL NTG without any improvement and called EMS.  He was given ASA and Heparin bolus 5000 units. EKG showed ST elevated in V1 and V2, aVL and I with reciprocal ST depression in the inferior leads and code STEMI was called.  In ER patient continues to complain of CP.  Now started on IV NTG gtt.     PMH:    Past Medical History  Diagnosis Date  . Acid reflux   . Depression   . Anxiety   . Peripheral vascular disease (HCC)   . Hemorrhoid   . NSTEMI (non-ST elevated myocardial infarction) (HCC) 12/2014  . History of kidney  stones     PSH:    Past Surgical History  Procedure Laterality Date  . Elbow surgery Bilateral   . Kidney stone surgery      x2  . Aorta - bilateral femoral artery bypass graft N/A 01/07/2013    Procedure: AORTA BIFEMORAL BYPASS GRAFT;  Surgeon: Fransisco HertzBrian L Chen, MD;  [facility];  Service: Vascular;  Laterality: N/A;  . Abdominal aortagram N/A 12/18/2012    Procedure: ABDOMINAL AORTAGRAM;  Surgeon: Fransisco HertzBrian L Chen, MD;  [facility];  Service: Cardiovascular;  Laterality: N/A;  . Cardiac catheterization N/A 01/06/2015    Procedure: Left Heart Cath and Coronary Angiography;  Surgeon: Kathleene Hazelhristopher D McAlhany, MD;  [facility];  Service: Cardiovascular;  Laterality: N/A;  . Cardiac catheterization N/A 01/06/2015    Procedure: Coronary Stent Intervention;  Surgeon: Kathleene Hazelhristopher D McAlhany, MD;  [facility];  Service: Cardiovascular;  Laterality: N/A;    ALLERGIES:   Simvastatin  Prior to Admit Meds:   (Not in a hospital admission) Family HX:    Family History  Problem Relation Age of Onset  . Heart disease Mother     NOT  before age 49  . Heart attack Mother   . Hyperlipidemia Mother   . Heart disease Maternal Grandmother   . Cancer Paternal Grandfather   . Heart disease Father     NOT  before age 49  . Heart attack  Father   . Hyperlipidemia Father    Social HX:    Social History   Social History  . Marital Status: Legally Separated    Spouse Name: N/A  . Number of Children: N/A  . Years of Education: N/A   Occupational History  . Not on file.   Social History Main Topics  . Smoking status: Light Tobacco Smoker -- 1.00 packs/day for 30 years    Types: Cigarettes, E-cigarettes  . Smokeless tobacco: Never Used  . Alcohol Use: No     Comment: occasional  . Drug Use: No  . Sexual Activity: Not on file   Other Topics Concern  . Not on file   Social History Narrative     ROS:  All 11 ROS were addressed and are negative except  what is stated in the HPI  PHYSICAL EXAM Filed Vitals:   07/02/15 2342 07/02/15 2346  BP: 159/96 155/89  Pulse: 49 47  Temp:    Resp: 11 12   General: Well developed, well nourished, in no acute distress Head: Eyes PERRLA, No xanthomas.   Normal cephalic and atramatic  Lungs:   Clear bilaterally to auscultation and percussion. Heart:   HRRR S1 S2 Pulses are 2+ & equal.            No carotid bruit. No JVD.  No abdominal bruits. No femoral bruits. Abdomen: Bowel sounds are positive, abdomen soft and non-tender without masses or                  Hernia's noted. Msk:  Back normal, normal gait. Normal strength and tone for age. Extremities:   No clubbing, cyanosis or edema.  DP +1 Neuro: Alert and oriented X 3. Psych:  Good affect, responds appropriately   Labs:   Lab Results  Component Value Date   WBC 6.7 02/09/2015   HGB 15.6 02/09/2015   HCT 47.6 02/09/2015   MCV 90.7 02/09/2015   PLT 171 02/09/2015   No results for input(s): NA, K, CL, CO2, BUN, CREATININE, CALCIUM, PROT, BILITOT, ALKPHOS, ALT, AST, GLUCOSE in the last 168 hours.  Invalid input(s): LABALBU Lab Results  Component Value Date   TROPONINI <0.03 02/09/2015   No results found for: PTT Lab Results  Component Value Date   INR 1.03 02/08/2015   INR 0.99 01/06/2015   INR 0.94 01/05/2015     Lab Results  Component Value Date   CHOL 171 02/09/2015   CHOL 153 01/06/2015   CHOL 158 01/08/2013   Lab Results  Component Value Date   HDL 36* 02/09/2015   HDL 32* 01/06/2015   HDL 32* 01/08/2013   Lab Results  Component Value Date   LDLCALC 109* 02/09/2015   LDLCALC 99 01/06/2015   LDLCALC 111* 01/08/2013   Lab Results  Component Value Date   TRIG 130 02/09/2015   TRIG 110 01/06/2015   TRIG 76 01/08/2013   Lab Results  Component Value Date   CHOLHDL 4.8 02/09/2015   CHOLHDL 4.8 01/06/2015   CHOLHDL 4.9 01/08/2013   No results found for: LDLDIRECT    Radiology:  No results found.  EKG:  NSR  with ST elevation in I, aVL, V1 and V2 with reciprocal ST depression in the inferior leads  ASSESSMENT/PLAN:   1.  STEMI with lateral ST elevation and V1 and V2.  He has had a recent stent placed in the LAD and known disease of the diagonal  He is currently on Brilinta and ASA.and  is in the [place] trial.  Case reviewed with Dr. Herbie Baltimore and will take for emergent cath. 2.  ASCAD with recent NSTEMI with cath showing 99% proximal LAD lesion treated with DES (Xience 3.5 x 33 mm DES), 60% D2 stenosis, 70% proximal to mid LAD lesion covered with same stent, 50% mid RCA lesion, 30% distal RCA lesion, EF 45-50%.  Nuclear stress test 01/2015 with no ischemia.  Continue ASA/Brilinta/BB/statin.   3.  Hyperlipidemia with LDL goal < 70.  Continue high dose statin.  Check FLP and ALT in am.  4.  Tobacco abuse ongoing.  He has tried Chantix but continues to smoke.   Armanda Magic, MD  07/02/2015  11:52 PM

## 2015-07-03 ENCOUNTER — Other Ambulatory Visit (HOSPITAL_COMMUNITY): Payer: Self-pay

## 2015-07-03 DIAGNOSIS — I2102 ST elevation (STEMI) myocardial infarction involving left anterior descending coronary artery: Secondary | ICD-10-CM

## 2015-07-03 DIAGNOSIS — I2129 ST elevation (STEMI) myocardial infarction involving other sites: Secondary | ICD-10-CM

## 2015-07-03 DIAGNOSIS — I228 Subsequent ST elevation (STEMI) myocardial infarction of other sites: Secondary | ICD-10-CM | POA: Diagnosis present

## 2015-07-03 DIAGNOSIS — I2510 Atherosclerotic heart disease of native coronary artery without angina pectoris: Secondary | ICD-10-CM

## 2015-07-03 LAB — BASIC METABOLIC PANEL
Anion gap: 9 (ref 5–15)
BUN: 10 mg/dL (ref 6–20)
CALCIUM: 8.3 mg/dL — AB (ref 8.9–10.3)
CO2: 23 mmol/L (ref 22–32)
CREATININE: 1.14 mg/dL (ref 0.61–1.24)
Chloride: 106 mmol/L (ref 101–111)
GFR calc Af Amer: >60 mL/min (ref >60)
GLUCOSE: 113 mg/dL — AB (ref 65–99)
Potassium: 3.8 mmol/L (ref 3.5–5.1)
SODIUM: 138 mmol/L (ref 135–145)

## 2015-07-03 LAB — MAGNESIUM: Magnesium: 1.7 mg/dL (ref 1.7–2.4)

## 2015-07-03 LAB — COMPREHENSIVE METABOLIC PANEL
ALBUMIN: 3.4 g/dL — AB (ref 3.5–5.0)
ALT: 23 U/L (ref 17–63)
ALT: 23 U/L (ref 17–63)
ANION GAP: 10 (ref 5–15)
AST: 26 U/L (ref 15–41)
AST: 35 U/L (ref 15–41)
Albumin: 3 g/dL — ABNORMAL LOW (ref 3.5–5.0)
Alkaline Phosphatase: 69 U/L (ref 38–126)
Alkaline Phosphatase: 75 U/L (ref 38–126)
Anion gap: 10 (ref 5–15)
BUN: 10 mg/dL (ref 6–20)
BUN: 9 mg/dL (ref 6–20)
CHLORIDE: 107 mmol/L (ref 101–111)
CHLORIDE: 108 mmol/L (ref 101–111)
CO2: 21 mmol/L — AB (ref 22–32)
CO2: 21 mmol/L — ABNORMAL LOW (ref 22–32)
CREATININE: 1.13 mg/dL (ref 0.61–1.24)
Calcium: 8.2 mg/dL — ABNORMAL LOW (ref 8.9–10.3)
Calcium: 9.2 mg/dL (ref 8.9–10.3)
Creatinine, Ser: 1.36 mg/dL — ABNORMAL HIGH (ref 0.61–1.24)
GFR calc Af Amer: >60 mL/min (ref >60)
GFR calc non Af Amer: >60 mL/min (ref >60)
GLUCOSE: 98 mg/dL (ref 65–99)
Glucose, Bld: 118 mg/dL — ABNORMAL HIGH (ref 65–99)
POTASSIUM: 3.3 mmol/L — AB (ref 3.5–5.1)
POTASSIUM: 3.9 mmol/L (ref 3.5–5.1)
SODIUM: 138 mmol/L (ref 135–145)
Sodium: 139 mmol/L (ref 135–145)
TOTAL PROTEIN: 5.9 g/dL — AB (ref 6.5–8.1)
Total Bilirubin: 0.6 mg/dL (ref 0.3–1.2)
Total Bilirubin: 0.6 mg/dL (ref 0.3–1.2)
Total Protein: 5.3 g/dL — ABNORMAL LOW (ref 6.5–8.1)

## 2015-07-03 LAB — CBC WITH DIFFERENTIAL/PLATELET
BASOS ABS: 0 10*3/uL (ref 0.0–0.1)
BASOS PCT: 0 %
EOS ABS: 0.1 10*3/uL (ref 0.0–0.7)
EOS PCT: 1 %
HCT: 42.9 % (ref 39.0–52.0)
Hemoglobin: 14.4 g/dL (ref 13.0–17.0)
LYMPHS PCT: 20 %
Lymphs Abs: 1.7 10*3/uL (ref 0.7–4.0)
MCH: 29.7 pg (ref 26.0–34.0)
MCHC: 33.6 g/dL (ref 30.0–36.0)
MCV: 88.5 fL (ref 78.0–100.0)
Monocytes Absolute: 0.4 10*3/uL (ref 0.1–1.0)
Monocytes Relative: 5 %
Neutro Abs: 6.1 10*3/uL (ref 1.7–7.7)
Neutrophils Relative %: 74 %
PLATELETS: 170 10*3/uL (ref 150–400)
RBC: 4.85 MIL/uL (ref 4.22–5.81)
RDW: 13 % (ref 11.5–15.5)
WBC: 8.3 10*3/uL (ref 4.0–10.5)

## 2015-07-03 LAB — LIPID PANEL
CHOLESTEROL: 116 mg/dL (ref 0–200)
HDL: 34 mg/dL — ABNORMAL LOW (ref >40)
LDL Cholesterol: 68 mg/dL (ref 0–99)
Total CHOL/HDL Ratio: 3.4 RATIO
Triglycerides: 72 mg/dL (ref <150)
VLDL: 14 mg/dL (ref 0–40)

## 2015-07-03 LAB — PROTIME-INR
INR: 1.08 (ref 0.00–1.49)
INR: 1.08 (ref 0.00–1.49)
PROTHROMBIN TIME: 14.2 s (ref 11.6–15.2)
Prothrombin Time: 14.2 seconds (ref 11.6–15.2)

## 2015-07-03 LAB — TROPONIN I
TROPONIN I: 4.05 ng/mL — AB (ref <0.031)
TROPONIN I: 8.32 ng/mL — AB (ref <0.031)
TROPONIN I: 6.92 ng/mL — AB (ref <0.031)
Troponin I: 3.46 ng/mL (ref <0.031)

## 2015-07-03 LAB — POCT I-STAT TROPONIN I: TROPONIN I, POC: 0.03 ng/mL (ref 0.00–0.08)

## 2015-07-03 LAB — APTT: APTT: 46 s — AB (ref 24–37)

## 2015-07-03 LAB — TSH: TSH: 1.53 u[IU]/mL (ref 0.350–4.500)

## 2015-07-03 LAB — MRSA PCR SCREENING: MRSA BY PCR: NEGATIVE

## 2015-07-03 MED ORDER — HEPARIN (PORCINE) IN NACL 2-0.9 UNIT/ML-% IJ SOLN
INTRAMUSCULAR | Status: DC | PRN
  Administered 2015-07-03: 1000 mL

## 2015-07-03 MED ORDER — HEPARIN SODIUM (PORCINE) 1000 UNIT/ML IJ SOLN
INTRAMUSCULAR | Status: DC | PRN
  Administered 2015-07-03: 2000 [IU] via INTRAVENOUS
  Administered 2015-07-03: 3500 [IU] via INTRAVENOUS

## 2015-07-03 MED ORDER — TIROFIBAN HCL IN NACL 5-0.9 MG/100ML-% IV SOLN
INTRAVENOUS | Status: AC
  Filled 2015-07-03: qty 100

## 2015-07-03 MED ORDER — ASPIRIN 81 MG PO CHEW: 324.0000 mg | CHEWABLE_TABLET | ORAL | Status: DC

## 2015-07-03 MED ORDER — NITROGLYCERIN 0.4 MG SL SUBL: 0.4000 mg | SUBLINGUAL_TABLET | SUBLINGUAL | Status: DC | PRN

## 2015-07-03 MED ORDER — TICAGRELOR 90 MG PO TABS
ORAL_TABLET | ORAL | Status: DC | PRN
  Administered 2015-07-03: 90 mg via ORAL

## 2015-07-03 MED ORDER — MIDAZOLAM HCL 2 MG/2ML IJ SOLN
INTRAMUSCULAR | Status: AC
  Filled 2015-07-03: qty 2

## 2015-07-03 MED ORDER — ACETAMINOPHEN 325 MG PO TABS
650.0000 mg | ORAL_TABLET | ORAL | Status: DC | PRN
  Administered 2015-07-04: 650 mg via ORAL
  Filled 2015-07-03: qty 2

## 2015-07-03 MED ORDER — ASPIRIN 81 MG PO CHEW
81.0000 mg | CHEWABLE_TABLET | Freq: Every day | ORAL | Status: DC
  Administered 2015-07-03 – 2015-07-05 (×3): 81 mg via ORAL
  Filled 2015-07-03 (×3): qty 1

## 2015-07-03 MED ORDER — TRAZODONE HCL 50 MG PO TABS
25.0000 mg | ORAL_TABLET | Freq: Every evening | ORAL | Status: AC | PRN
Start: 2015-07-03 — End: 2015-07-03
  Administered 2015-07-03: 25 mg via ORAL
  Filled 2015-07-03: qty 1

## 2015-07-03 MED ORDER — ATORVASTATIN CALCIUM 80 MG PO TABS
80.0000 mg | ORAL_TABLET | Freq: Every day | ORAL | Status: DC
  Administered 2015-07-03 – 2015-07-04 (×2): 80 mg via ORAL
  Filled 2015-07-03 (×2): qty 1

## 2015-07-03 MED ORDER — TIROFIBAN (AGGRASTAT) BOLUS VIA INFUSION
INTRAVENOUS | Status: DC | PRN
  Administered 2015-07-03: 1927.5 ug via INTRAVENOUS

## 2015-07-03 MED ORDER — TICAGRELOR 90 MG PO TABS
ORAL_TABLET | ORAL | Status: AC
  Filled 2015-07-03: qty 1

## 2015-07-03 MED ORDER — SODIUM CHLORIDE 0.9 % WEIGHT BASED INFUSION
1.0000 mL/kg/h | INTRAVENOUS | Status: AC
  Administered 2015-07-03: 1 mL/kg/h via INTRAVENOUS

## 2015-07-03 MED ORDER — MIDAZOLAM HCL 2 MG/2ML IJ SOLN
INTRAMUSCULAR | Status: AC
Start: 2015-07-03 — End: 2015-07-03
  Filled 2015-07-03: qty 2

## 2015-07-03 MED ORDER — SODIUM CHLORIDE 0.9 % IV SOLN: 250.0000 mL | INTRAVENOUS | Status: DC | PRN

## 2015-07-03 MED ORDER — MIDAZOLAM HCL 2 MG/2ML IJ SOLN
INTRAMUSCULAR | Status: DC | PRN
  Administered 2015-07-03: 1 mg via INTRAVENOUS
  Administered 2015-07-03: 2 mg via INTRAVENOUS
  Administered 2015-07-03 (×2): 1 mg via INTRAVENOUS

## 2015-07-03 MED ORDER — VERAPAMIL HCL 2.5 MG/ML IV SOLN
INTRAVENOUS | Status: DC | PRN
  Administered 2015-07-03: 10 mL via INTRA_ARTERIAL

## 2015-07-03 MED ORDER — SODIUM CHLORIDE 0.9% FLUSH
3.0000 mL | INTRAVENOUS | Status: DC | PRN
  Administered 2015-07-04: 3 mL via INTRAVENOUS
  Filled 2015-07-03: qty 3

## 2015-07-03 MED ORDER — FAMOTIDINE IN NACL 20-0.9 MG/50ML-% IV SOLN
INTRAVENOUS | Status: AC
  Filled 2015-07-03: qty 50

## 2015-07-03 MED ORDER — TIROFIBAN HCL IN NACL 5-0.9 MG/100ML-% IV SOLN
INTRAVENOUS | Status: DC | PRN
  Administered 2015-07-03: 0.15 ug/kg/min via INTRAVENOUS

## 2015-07-03 MED ORDER — FENTANYL CITRATE (PF) 100 MCG/2ML IJ SOLN
INTRAMUSCULAR | Status: AC
  Filled 2015-07-03: qty 2

## 2015-07-03 MED ORDER — LIDOCAINE HCL (PF) 1 % IJ SOLN
INTRAMUSCULAR | Status: DC | PRN
  Administered 2015-07-03: 20 mL
  Administered 2015-07-03: 5 mL

## 2015-07-03 MED ORDER — POTASSIUM CHLORIDE CRYS ER 20 MEQ PO TBCR
40.0000 meq | EXTENDED_RELEASE_TABLET | Freq: Once | ORAL | Status: AC
  Administered 2015-07-03: 40 meq via ORAL
  Filled 2015-07-03: qty 2

## 2015-07-03 MED ORDER — TICAGRELOR 90 MG PO TABS: 90.0000 mg | ORAL_TABLET | Freq: Two times a day (BID) | ORAL | Status: DC

## 2015-07-03 MED ORDER — LIDOCAINE HCL (PF) 1 % IJ SOLN
INTRAMUSCULAR | Status: AC
  Filled 2015-07-03: qty 30

## 2015-07-03 MED ORDER — HEPARIN (PORCINE) IN NACL 100-0.45 UNIT/ML-% IJ SOLN: 900.0000 [IU]/h | INTRAMUSCULAR | Status: DC

## 2015-07-03 MED ORDER — FAMOTIDINE IN NACL 20-0.9 MG/50ML-% IV SOLN
INTRAVENOUS | Status: DC | PRN
  Administered 2015-07-03: 20 mg via INTRAVENOUS

## 2015-07-03 MED ORDER — IOPAMIDOL (ISOVUE-370) INJECTION 76%
INTRAVENOUS | Status: DC | PRN
  Administered 2015-07-03: 115 mL via INTRA_ARTERIAL

## 2015-07-03 MED ORDER — HYDRALAZINE HCL 20 MG/ML IJ SOLN
INTRAMUSCULAR | Status: AC
  Filled 2015-07-03: qty 1

## 2015-07-03 MED ORDER — PANTOPRAZOLE SODIUM 40 MG PO TBEC
40.0000 mg | DELAYED_RELEASE_TABLET | Freq: Every day | ORAL | Status: DC
  Administered 2015-07-03 – 2015-07-05 (×3): 40 mg via ORAL
  Filled 2015-07-03 (×3): qty 1

## 2015-07-03 MED ORDER — FENTANYL CITRATE (PF) 100 MCG/2ML IJ SOLN
INTRAMUSCULAR | Status: DC | PRN
  Administered 2015-07-03 (×2): 25 ug via INTRAVENOUS
  Administered 2015-07-03: 50 ug via INTRAVENOUS

## 2015-07-03 MED ORDER — SODIUM CHLORIDE 0.9% FLUSH
3.0000 mL | Freq: Two times a day (BID) | INTRAVENOUS | Status: DC
  Administered 2015-07-03 – 2015-07-05 (×4): 3 mL via INTRAVENOUS

## 2015-07-03 MED ORDER — TICAGRELOR 90 MG PO TABS
90.0000 mg | ORAL_TABLET | Freq: Two times a day (BID) | ORAL | Status: DC
  Administered 2015-07-03 – 2015-07-05 (×5): 90 mg via ORAL
  Filled 2015-07-03 (×5): qty 1

## 2015-07-03 MED ORDER — HEPARIN (PORCINE) IN NACL 2-0.9 UNIT/ML-% IJ SOLN
INTRAMUSCULAR | Status: AC
  Filled 2015-07-03: qty 1000

## 2015-07-03 MED ORDER — IOPAMIDOL (ISOVUE-370) INJECTION 76%
INTRAVENOUS | Status: AC
  Filled 2015-07-03: qty 50

## 2015-07-03 MED ORDER — ONDANSETRON HCL 4 MG/2ML IJ SOLN: 4.0000 mg | Freq: Four times a day (QID) | INTRAMUSCULAR | Status: DC | PRN

## 2015-07-03 MED ORDER — HYDRALAZINE HCL 20 MG/ML IJ SOLN: 10.0000 mg | INTRAMUSCULAR | Status: DC | PRN

## 2015-07-03 MED ORDER — MORPHINE SULFATE (PF) 10 MG/ML IV SOLN
INTRAVENOUS | Status: DC | PRN
  Administered 2015-07-03: 4 mg via INTRAVENOUS

## 2015-07-03 MED ORDER — METOPROLOL TARTRATE 12.5 MG HALF TABLET
12.5000 mg | ORAL_TABLET | Freq: Two times a day (BID) | ORAL | Status: DC
Start: 2015-07-03 — End: 2015-07-05
  Administered 2015-07-03 – 2015-07-05 (×5): 12.5 mg via ORAL
  Filled 2015-07-03 (×5): qty 1

## 2015-07-03 MED ORDER — FUROSEMIDE 10 MG/ML IJ SOLN
20.0000 mg | Freq: Once | INTRAMUSCULAR | Status: AC
  Administered 2015-07-03: 20 mg via INTRAVENOUS
  Filled 2015-07-03: qty 2

## 2015-07-03 MED ORDER — IOPAMIDOL (ISOVUE-370) INJECTION 76%
INTRAVENOUS | Status: AC
  Filled 2015-07-03: qty 125

## 2015-07-03 MED ORDER — ACETAMINOPHEN 325 MG PO TABS: 650.0000 mg | ORAL_TABLET | ORAL | Status: DC | PRN

## 2015-07-03 MED ORDER — NITROGLYCERIN 1 MG/10 ML FOR IR/CATH LAB
INTRA_ARTERIAL | Status: AC
  Filled 2015-07-03: qty 10

## 2015-07-03 MED ORDER — ASPIRIN EC 81 MG PO TBEC: 81.0000 mg | DELAYED_RELEASE_TABLET | Freq: Every day | ORAL | Status: DC

## 2015-07-03 MED ORDER — ASPIRIN 300 MG RE SUPP: 300.0000 mg | RECTAL | Status: DC

## 2015-07-03 MED ORDER — MORPHINE SULFATE (PF) 4 MG/ML IV SOLN
4.0000 mg | INTRAVENOUS | Status: DC | PRN
Start: 2015-07-03 — End: 2015-07-05
  Administered 2015-07-03 – 2015-07-04 (×7): 4 mg via INTRAVENOUS
  Filled 2015-07-03 (×7): qty 1

## 2015-07-03 MED ORDER — TIROFIBAN HCL IN NACL 5-0.9 MG/100ML-% IV SOLN
0.1500 ug/kg/min | INTRAVENOUS | Status: DC
  Administered 2015-07-03: 0.15 ug/kg/min via INTRAVENOUS
  Filled 2015-07-03: qty 100

## 2015-07-03 MED ORDER — HYDRALAZINE HCL 20 MG/ML IJ SOLN
INTRAMUSCULAR | Status: DC | PRN
  Administered 2015-07-03: 10 mg via INTRAVENOUS

## 2015-07-03 MED ORDER — MORPHINE SULFATE (PF) 10 MG/ML IV SOLN
INTRAVENOUS | Status: AC
  Filled 2015-07-03: qty 1

## 2015-07-03 NOTE — Progress Notes (Signed)
   07/03/15 0100  Clinical Encounter Type  Visited With Family  Visit Type ED  Referral From Nurse  Consult/Referral To Chaplain  [facility] responded to STEMI. [facility]P supported patient's mother, [facility], sat with her until receiving another page. Rodney BoozeGail L Merrily Tegeler 07/03/2015

## 2015-07-03 NOTE — Progress Notes (Addendum)
ANTICOAGULATION CONSULT NOTE - Initial Consult  Pharmacy Consult for heparin Indication: CAD  Allergies  Allergen Reactions  . Simvastatin Nausea Only    Stomach aches    Patient Measurements: Height:  (175.3 cm) Weight: 170 lb (77.111 kg) IBW/kg (Calculated) : 70.7  Vital Signs: Temp: 97.7 F (36.5 C) (06/03 2338) Temp Source: Oral (06/03 2338) BP: 140/104 mmHg (06/04 0148) Pulse Rate: 51 (06/04 0148)  Labs:  Recent Labs  07/02/15 2339  HGB 16.0  HCT 48.2  PLT 187  CREATININE 1.36*    Estimated Creatinine Clearance: 66.4 mL/min (by C-G formula based on Cr of 1.36).   Medical History: Past Medical History  Diagnosis Date  . Acid reflux   . Depression   . Anxiety   . Peripheral vascular disease (HCC)   . Hemorrhoid   . NSTEMI (non-ST elevated myocardial infarction) (HCC) 12/2014  . CAD S/P percutaneous coronary angioplasty 01/06/2015    a. NSTEMI - Xience DES 3.5 x 33 p-mLAD; 60% D1, 50% mRCA  . History of kidney stones     Medications:  Prescriptions prior to admission  Medication Sig Dispense Refill Last Dose  . AMBULATORY NON FORMULARY MEDICATION Take 90 mg by mouth 2 (two) times daily. Medication Name: Brilinta 90 mg BID (TWILIGHT Research Provided do not fill)   07/02/2015 at Unknown time  . AMBULATORY NON FORMULARY MEDICATION Take 81 mg by mouth daily. Medication Name: ASA 81 mg or PLACEBO Daily (TWILIGHT Research study provided)   07/02/2015 at Unknown time  . aspirin EC 81 MG tablet Take 324 mg by mouth once.   07/02/2015 at Unknown time  . atorvastatin (LIPITOR) 80 MG tablet Take 1 tablet (80 mg total) by mouth daily at 6 PM. 30 tablet 5 07/02/2015 at Unknown time  . ibuprofen (ADVIL,MOTRIN) 200 MG tablet Take 400-600 mg by mouth every 6 (six) hours as needed for headache (pain).   Past Month at Unknown time  . metoprolol tartrate (LOPRESSOR) 25 MG tablet Take 0.5 tablets (12.5 mg total) by mouth 2 (two) times daily. 60 tablet 5 07/02/2015 at 1800  .  nitroGLYCERIN (NITROSTAT) 0.4 MG SL tablet Place 1 tablet (0.4 mg total) under the tongue every 5 (five) minutes as needed for chest pain (x 3 doses). 25 tablet 2 07/02/2015 at Unknown time  . varenicline (CHANTIX STARTING MONTH PAK) 0.5 MG X 11 & 1 MG X 42 tablet Take one 0.5 mg tablet by mouth once daily for 3 days, then increase to one 0.5 mg tablet twice daily for 4 days, then increase to one 1 mg tablet twice daily. (Patient not taking: Reported on 04/01/2015) 53 tablet 0 Not Taking at Unknown time   Scheduled:  . aspirin  324 mg Oral NOW   Or  . aspirin  300 mg Rectal NOW  . aspirin  81 mg Oral Daily  . [START ON 07/04/2015] aspirin EC  81 mg Oral Daily  . atorvastatin  80 mg Oral q1800  . metoprolol tartrate  12.5 mg Oral BID  . potassium chloride  40 mEq Oral Once  . sodium chloride flush  3 mL Intravenous Q12H  . ticagrelor  90 mg Oral BID  . ticagrelor  90 mg Oral BID   Infusions:  . sodium chloride    . nitroGLYCERIN 30 mcg/min (07/03/15 0135)  . tirofiban      Assessment: 50yo male presents as code STEMI, has h/o recent NSTEMI (Dec 2016), taken emergently to cath lab, rec'd angioplasty to  prior stent, to start heparin 8hr after sheath removal and to run x48hr; sheath removed 0140.  Goal of Therapy:  Heparin level 0.3-0.5 units/ml (to be on extended Aggrastat infusion) Monitor platelets by anticoagulation protocol: Yes   Plan:  Will begin heparin gtt at 900 units/hr at 0945 this am and monitor heparin levels and CBC.  Vernard GamblesVeronda Dexton Zwilling, PharmD, BCPS  07/03/2015,2:17 AM

## 2015-07-03 NOTE — Progress Notes (Signed)
Dr. Herbie BaltimoreHarding in room, took note of swelling to right arm, proximal to radial site, pressure held again for 20 minutes. Continue to monitor closely.

## 2015-07-03 NOTE — Progress Notes (Signed)
Patient ID: Tommy CapersLarry W Vancleve Jr., male   DOB: [birth year], 50 y.o.   MRN: [id number]    Patient Name: Tommy CapersLarry W Cavanagh Jr. Date of Encounter: 07/03/2015     Principal Problem:   Acute ST elevation myocardial infarction (STEMI) of lateral wall (HCC) Active Problems:   Nicotine addiction   Atherosclerosis of native artery of extremity with intermittent claudication (HCC)   Hyperlipidemia with target LDL less than 70   Tobacco abuse   Subsequent ST elevation (STEMI) myocardial infarction of other sites [facility](HCC)    SUBJECTIVE  No chest pain, c/o soreness in wrist.  CURRENT MEDS . aspirin  81 mg Oral Daily  . atorvastatin  80 mg Oral q1800  . metoprolol tartrate  12.5 mg Oral BID  . pantoprazole  40 mg Oral Q0600  . sodium chloride flush  3 mL Intravenous Q12H  . ticagrelor  90 mg Oral BID    OBJECTIVE  Filed Vitals:   07/03/15 0600 07/03/15 0615 07/03/15 0630 07/03/15 0645  BP: 113/73 115/70 104/76 107/71  Pulse: 48 46 47 49  Temp:      TempSrc:      Resp: 16 13 10 15   Height:      Weight:      SpO2: 99% 98% 98% 98%    Intake/Output Summary (Last 24 hours) at 07/03/15 0902 Last data filed at 07/03/15 0700  Gross per 24 hour  Intake 577.14 ml  Output   1700 ml  Net -1122.86 ml   Filed Weights   07/02/15 2338 07/03/15 0215  Weight: 170 lb (77.111 kg) 167 lb 15.9 oz (76.2 kg)    PHYSICAL EXAM  General: Pleasant, NAD. Neuro: Alert and oriented X 3. Moves all extremities spontaneously. Psych: Normal affect. HEENT:  Normal  Neck: Supple without bruits or JVD. Lungs:  Resp regular and unlabored, CTA. Heart: RRR no s3, s4, or murmurs. Abdomen: Soft, non-tender, non-distended, BS + x 4.  Extremities: No clubbing, cyanosis or edema. DP/PT/Radials 2+ and equal bilaterally. Right hand is warm and no hematoma  Accessory Clinical Findings  CBC  Recent Labs  07/02/15 2339 07/03/15 0533  WBC 10.5 8.3  NEUTROABS 6.6 6.1  HGB 16.0 14.4  HCT 48.2 42.9  MCV 90.3 88.5  PLT  187 170   Basic Metabolic Panel  Recent Labs  07/02/15 2339 07/03/15 0533  NA 139 138  138  K 3.3* 3.9  3.8  CL 108 107  106  CO2 21* 21*  23  GLUCOSE 98 118*  113*  BUN 10 9  10   CREATININE 1.36* 1.13  1.14  CALCIUM 9.2 8.2*  8.3*  MG  --  1.7   Liver Function Tests  Recent Labs  07/02/15 2339 07/03/15 0533  AST 26 35  ALT 23 23  ALKPHOS 75 69  BILITOT 0.6 0.6  PROT 5.9* 5.3*  ALBUMIN 3.4* 3.0*   No results for input(s): LIPASE, AMYLASE in the last 72 hours. Cardiac Enzymes  Recent Labs  07/03/15 0533  TROPONINI 3.46*  4.05*   BNP Invalid input(s): POCBNP D-Dimer No results for input(s): DDIMER in the last 72 hours. Hemoglobin A1C No results for input(s): HGBA1C in the last 72 hours. Fasting Lipid Panel  Recent Labs  07/03/15 0530  CHOL 116  HDL 34*  LDLCALC 68  TRIG 72  CHOLHDL 3.4   Thyroid Function Tests  Recent Labs  07/03/15 0533  TSH 1.530    TELE  nsr  Radiology/Studies  [facility]  07/03/2015  CLINICAL DATA:  Acute onset of generalized chest pain. Burning and tightness. Diaphoresis. Initial encounter. EXAM: PORTABLE CHEST 1 VIEW COMPARISON:  Chest radiograph performed 02/08/2015 FINDINGS: The lungs are well-aerated. Minimal bibasilar opacities likely reflect atelectasis. There is no evidence of pleural effusion or pneumothorax. The cardiomediastinal silhouette is within normal limits. No acute osseous abnormalities are seen. IMPRESSION: Minimal bibasilar opacities likely reflect atelectasis. Electronically Signed   By: Roanna Raider M.D.   On: 07/03/2015 00:06    ASSESSMENT AND PLAN  1. NSTEMI - he is now pain free, s/p diagonal PCI. Will continue anti-coagulation.  2. Dyslipidemia - continue medical therapy. 3. Peripheral vascular disease - currently asymptomatic  Nole Robey,M.D.  07/03/2015 9:02 AM

## 2015-07-03 NOTE — Progress Notes (Signed)
Received patient from cath lab, S/p PCI, with left femoral dressing in place, area soft, old bloody drainage noted, right radial dressing with the distant site having a band aid on  And new radial site with TR Band in place with dressing dry, Site above the TR band with hematoma greater than 3cm noted, cath lab techs in the room and appplied pressure tape to site and elevated on second pillow.

## 2015-07-03 NOTE — Interval H&P Note (Signed)
History and Physical Interval Note:  07/03/2015 12:02 AM  Tommy CapersLarry W Happ Jr.  has presented today for surgery, with the diagnosis of lateral STEMI  The various methods of treatment have been discussed with the patient and family. After consideration of risks, benefits and other options for treatment, the patient has provided verbal consent to  Procedure(s): Left Heart Cath and Coronary Angiography (N/A) With Possible Percutaneous Coronary Intervention as a surgical intervention .  The patient's history has been reviewed, patient examined, no change in status, stable for surgery.  I have reviewed the patient's chart and labs.  Questions were answered to the patient's satisfaction.    Implied consent for emergency   Bryan Lemmaavid Harding

## 2015-07-04 ENCOUNTER — Inpatient Hospital Stay (HOSPITAL_COMMUNITY): Payer: Self-pay

## 2015-07-04 ENCOUNTER — Encounter (HOSPITAL_COMMUNITY): Payer: Self-pay | Admitting: Cardiology

## 2015-07-04 DIAGNOSIS — I2510 Atherosclerotic heart disease of native coronary artery without angina pectoris: Secondary | ICD-10-CM

## 2015-07-04 DIAGNOSIS — Z720 Tobacco use: Secondary | ICD-10-CM

## 2015-07-04 LAB — CBC
HEMATOCRIT: 45.3 % (ref 39.0–52.0)
HEMOGLOBIN: 15 g/dL (ref 13.0–17.0)
MCH: 29.4 pg (ref 26.0–34.0)
MCHC: 33.1 g/dL (ref 30.0–36.0)
MCV: 88.8 fL (ref 78.0–100.0)
Platelets: 167 10*3/uL (ref 150–400)
RBC: 5.1 MIL/uL (ref 4.22–5.81)
RDW: 13 % (ref 11.5–15.5)
WBC: 9.1 10*3/uL (ref 4.0–10.5)

## 2015-07-04 LAB — ECHOCARDIOGRAM COMPLETE
CHL CUP MV DEC (S): 201
E decel time: 201 msec
EERAT: 6.14
FS: 43 % (ref 28–44)
HEIGHTINCHES: 69 in
IVS/LV PW RATIO, ED: 0.93
LA ID, A-P, ES: 33 cm
LA diam end sys: 33 cm
LA diam index: 1.72 cm/m2
LA vol A4C: 28.3 ml
LA vol index: 14.5 mL/m2
LAVOL: 27.8 cm3
LDCA: 3.46 cm2
LV E/e' medial: 6.14
LV E/e'average: 6.14
LV TDI E'LATERAL: 13.2
LVELAT: 13.2 cm/s
LVOTD: 21 cm
MV pk A vel: 44.1 m/s
MVPG: 3 mmHg
MVPKEVEL: 81 m/s
PW: 8.55 mm — AB (ref 0.6–1.1)
RV TAPSE: 25.7 cm
TDI e' medial: 8.7
WEIGHTICAEL: 2687.85 [oz_av]

## 2015-07-04 LAB — HEMOGLOBIN A1C
Hgb A1c MFr Bld: 5.7 % — ABNORMAL HIGH (ref 4.8–5.6)
MEAN PLASMA GLUCOSE: 117 mg/dL

## 2015-07-04 LAB — POCT ACTIVATED CLOTTING TIME: Activated Clotting Time: 266 seconds

## 2015-07-04 MED ORDER — PERFLUTREN LIPID MICROSPHERE
INTRAVENOUS | Status: AC
  Administered 2015-07-04: 2 mL
  Filled 2015-07-04: qty 10

## 2015-07-04 MED ORDER — CLONAZEPAM 0.5 MG PO TABS
0.5000 mg | ORAL_TABLET | Freq: Two times a day (BID) | ORAL | Status: DC | PRN
  Administered 2015-07-04 (×2): 0.5 mg via ORAL
  Filled 2015-07-04 (×2): qty 1

## 2015-07-04 MED ORDER — PERFLUTREN LIPID MICROSPHERE
1.0000 mL | INTRAVENOUS | Status: AC | PRN
  Filled 2015-07-04: qty 10

## 2015-07-04 MED ORDER — SERTRALINE HCL 100 MG PO TABS
100.0000 mg | ORAL_TABLET | Freq: Every day | ORAL | Status: DC
  Administered 2015-07-04 – 2015-07-05 (×2): 100 mg via ORAL
  Filled 2015-07-04 (×2): qty 1

## 2015-07-04 MED FILL — Nitroglycerin IV Soln 100 MCG/ML in D5W: INTRA_ARTERIAL | Qty: 10 | Status: AC

## 2015-07-04 MED FILL — Heparin Sodium (Porcine) 2 Unit/ML in Sodium Chloride 0.9%: INTRAMUSCULAR | Qty: 500 | Status: AC

## 2015-07-04 NOTE — Care Management Note (Addendum)
Case Management Note  Patient Details  Name: Johann CapersLarry W Nishida Jr. MRN: [id number] Date of Birth: Jun 01, 1965  Subjective/Objective:      Adm w mi              Action/Plan: lives w fam, pcp dr polite   Expected Discharge Date:                  Expected Discharge Plan:  Home/Self Care  In-House Referral:     Discharge planning Services  CM Consult, Medication Assistance  Post Acute Care Choice:    Choice offered to:     DME Arranged:    DME Agency:     HH Arranged:    HH Agency:     Status of Service:     Medicare Important Message Given:    Date Medicare IM Given:    Medicare IM give by:    Date Additional Medicare IM Given:    Additional Medicare Important Message give by:     If discussed at Long Length of Stay Meetings, dates discussed:    Additional Comments: pt states on study and gets brilinta for free.  Pt has applied for orange card. Gave him phone number to call and ck on his application. He will ck into medicaid he has minor children in the home. Gave him [place] [facility] list including [facility]. Hanley Haysowell, Leilany Digeronimo T, RN 07/04/2015, 10:05 AM

## 2015-07-04 NOTE — Progress Notes (Signed)
Echocardiogram 2D Echocardiogram has been performed.  Tommy Malone 07/04/2015, 10:29 AM

## 2015-07-04 NOTE — Progress Notes (Signed)
CARDIAC REHAB PHASE I   PRE:  Rate/Rhythm: 56 SB  BP:  Sitting: 118/77        SaO2: 97 RA  MODE:  Ambulation: 700 ft   POST:  Rate/Rhythm: 67 SR  BP:  Sitting: 139/104      SaO2: 99 RA  Pt ambulated 700 ft on RA, independent, steady gait, tolerated well, no complaints, states he is "irritable this morning." Completed MI/PCIeducation. Reviewed risk factors, tobacco cessation (pt declined fake cigarette, states he has not been able to quit smoking because he cannot afford chantix and that is "the only option" for him-however, pt requesting nicotine patch at this time, RN aware). Discussed anti-platelet therapy, MI book, activity restrictions, ntg, exercise, heart healthy diet, and phase 2 cardiac rehab. Pt verbalized understanding, limited receptiveness to education. Pt states he "walks all the time at work" and he "cannot afford healthy food." Pt agrees to phase 2 cardiac rehab referral, [facility] Pt states he quit taking several of his medications as he could not afford them. Pt states he does not have a PCP and had applied for an orange card at some point but never received a response. Case manager notified to follow-up with pt regarding medication needs. Pt to recliner after walk, call bell within reach. Will follow.  [phone] Joylene GrapesEmily C Therma Lasure, RN, BSN 07/04/2015 11:01 AM

## 2015-07-04 NOTE — Progress Notes (Signed)
SUBJECTIVE:  DEcreased chest discomfort  OBJECTIVE:   Vitals:   Filed Vitals:   07/04/15 0740 07/04/15 0800 07/04/15 0820 07/04/15 0923  BP:  106/63  114/74  Pulse:  52 58 53  Temp: 97.7 F (36.5 C)     TempSrc: Oral     Resp:  12 14 11   Height:      Weight:      SpO2:  97% 97% 97%   I&O's:   Intake/Output Summary (Last 24 hours) at 07/04/15 1047 Last data filed at 07/04/15 0800  Gross per 24 hour  Intake   2213 ml  Output   1100 ml  Net   1113 ml   TELEMETRY: Reviewed telemetry pt in sinus bradycardia:     PHYSICAL EXAM General: Well developed, well nourished, in no acute distress Head:   Normal cephalic and atramatic  Lungs:   Clear bilaterally to auscultation. Heart:   HRRR S1 S2  No JVD.   Abdomen: abdomen soft and non-tender Msk:  Back normal,  Normal strength and tone for age. Extremities:   No edema.  r wrist  Mildly swollen and tender Neuro: Alert and oriented. Psych:  Normal affect, responds appropriately Skin: No rash   LABS: Basic Metabolic Panel:  Recent Labs  21/30/8604/04/14 2339 07/03/15 0533  NA 139 138  138  K 3.3* 3.9  3.8  CL 108 107  106  CO2 21* 21*  23  GLUCOSE 98 118*  113*  BUN 10 9  10   CREATININE 1.36* 1.13  1.14  CALCIUM 9.2 8.2*  8.3*  MG  --  1.7   Liver Function Tests:  Recent Labs  07/02/15 2339 07/03/15 0533  AST 26 35  ALT 23 23  ALKPHOS 75 69  BILITOT 0.6 0.6  PROT 5.9* 5.3*  ALBUMIN 3.4* 3.0*   No results for input(s): LIPASE, AMYLASE in the last 72 hours. CBC:  Recent Labs  07/02/15 2339 07/03/15 0533 07/04/15 0218  WBC 10.5 8.3 9.1  NEUTROABS 6.6 6.1  --   HGB 16.0 14.4 15.0  HCT 48.2 42.9 45.3  MCV 90.3 88.5 88.8  PLT 187 170 167   Cardiac Enzymes:  Recent Labs  07/03/15 0533 07/03/15 1054 07/03/15 1447  TROPONINI 3.46*  4.05* 8.32* 6.92*   BNP: Invalid input(s): POCBNP D-Dimer: No results for input(s): DDIMER in the last 72 hours. Hemoglobin A1C: No results for input(s):  HGBA1C in the last 72 hours. Fasting Lipid Panel:  Recent Labs  07/03/15 0530  CHOL 116  HDL 34*  LDLCALC 68  TRIG 72  CHOLHDL 3.4   Thyroid Function Tests:  Recent Labs  07/03/15 0533  TSH 1.530   Anemia Panel: No results for input(s): VITAMINB12, FOLATE, FERRITIN, TIBC, IRON, RETICCTPCT in the last 72 hours. Coag Panel:   Lab Results  Component Value Date   INR 1.08 07/03/2015   INR 1.08 07/03/2015   INR 1.03 02/08/2015    RADIOLOGY: Dg Chest Port 1 View  07/03/2015  CLINICAL DATA:  Acute onset of generalized chest pain. Burning and tightness. Diaphoresis. Initial encounter. EXAM: PORTABLE CHEST 1 VIEW COMPARISON:  Chest radiograph performed 02/08/2015 FINDINGS: The lungs are well-aerated. Minimal bibasilar opacities likely reflect atelectasis. There is no evidence of pleural effusion or pneumothorax. The cardiomediastinal silhouette is within normal limits. No acute osseous abnormalities are seen. IMPRESSION: Minimal bibasilar opacities likely reflect atelectasis. Electronically Signed   By: Roanna RaiderJeffery  Chang M.D.   On: 07/03/2015 00:06  ASSESSMENT: s/p Lateral wall MI  PLAN:  1) Continue DAPT.  Transfer to floor.  I persoinally reviewed the angiogram.  Branch vessel disease persists post PCI.  Continue medical therapy.  He needs to stop smoking.    He wants to go back to work as soon as possible. Possible duischarge tomorrow.    Restart his home dose of clonazepam.  Corky Crafts, MD  07/04/2015  10:47 AM

## 2015-07-05 ENCOUNTER — Encounter (HOSPITAL_COMMUNITY): Payer: Self-pay | Admitting: Physician Assistant

## 2015-07-05 ENCOUNTER — Telehealth: Payer: Self-pay | Admitting: Internal Medicine

## 2015-07-05 DIAGNOSIS — E785 Hyperlipidemia, unspecified: Secondary | ICD-10-CM

## 2015-07-05 DIAGNOSIS — Z9861 Coronary angioplasty status: Secondary | ICD-10-CM

## 2015-07-05 LAB — CBC
HEMATOCRIT: 44.9 % (ref 39.0–52.0)
HEMOGLOBIN: 14.8 g/dL (ref 13.0–17.0)
MCH: 29.7 pg (ref 26.0–34.0)
MCHC: 33 g/dL (ref 30.0–36.0)
MCV: 90.2 fL (ref 78.0–100.0)
Platelets: 173 10*3/uL (ref 150–400)
RBC: 4.98 MIL/uL (ref 4.22–5.81)
RDW: 12.8 % (ref 11.5–15.5)
WBC: 9.5 10*3/uL (ref 4.0–10.5)

## 2015-07-05 MED ORDER — TICAGRELOR 90 MG PO TABS: 90.0000 mg | ORAL_TABLET | Freq: Two times a day (BID) | ORAL | Status: DC

## 2015-07-05 MED ORDER — PANTOPRAZOLE SODIUM 40 MG PO TBEC: 40.0000 mg | DELAYED_RELEASE_TABLET | Freq: Every day | ORAL | Status: DC

## 2015-07-05 MED ORDER — SERTRALINE HCL 100 MG PO TABS: 100.0000 mg | ORAL_TABLET | Freq: Every day | ORAL | Status: DC

## 2015-07-05 MED ORDER — ASPIRIN 81 MG PO CHEW: 81.0000 mg | CHEWABLE_TABLET | Freq: Every day | ORAL | Status: DC

## 2015-07-05 NOTE — Discharge Instructions (Signed)
No driving for 24 hours. No lifting over 5 lbs for 1 week. No sexual activity for 1 week. Keep procedure site clean & dry. If you notice increased pain, swelling, bleeding or pus, call/return!  You may shower, but no soaking baths/hot tubs/pools for 1 week.  ° ° ° °Acute Coronary Syndrome °Acute coronary syndrome (ACS) is a serious problem in which there is suddenly not enough blood and oxygen supplied to the heart. ACS may mean that one or more of the blood vessels in your heart (coronary arteries) may be blocked. ACS can result in chest pain or a heart attack (myocardial infarction or MI). °CAUSES °This condition is caused by atherosclerosis, which is the buildup of fat and cholesterol (plaque) on the inside of the arteries. Over time, the plaque may narrow or block the artery, and this will lessen blood flow to the heart. Plaque can also become weak and break off within a coronary artery to form a clot and cause a sudden blockage. °RISK FACTORS °The risks factors of this condition include: °· High cholesterol levels. °· High blood pressure (hypertension). °· Smoking. °· Diabetes. °· Age. °· Family history of chest pain, heart disease, or stroke. °· Lack of exercise. °SYMPTOMS °The most common signs of this condition include: °· Chest pain, which can be: °¨ A crushing or squeezing in the chest. °¨ A tightness, pressure, fullness, or heaviness in the chest. °¨ Present for more than a few minutes, or it can stop and recur. °· Pain in the arms, neck, jaw, or back. °· Unexplained heartburn or indigestion. °· Shortness of breath. °· Nausea. °· Sudden cold sweats. °· Feeling light-headed or dizzy. °Sometimes, this condition has no symptoms. °DIAGNOSIS °ACS may be diagnosed through the following tests: °· Electrocardiogram (ECG). °· Blood tests. °· Coronary angiogram. This is a procedure to look at the coronary arteries to see if there is any blockage. °TREATMENT °Treatment for ACS may include: °· Healthy behavioral  changes to reduce or control risk factors. °· Medicine. °· Coronary stenting. A stent helps to keep an artery open. °· Coronary angioplasty. This procedure widens a narrowed or blocked artery. °· Coronary artery bypass surgery. This will allow your blood to pass the blockage (bypass) to reach your heart. °HOME CARE INSTRUCTIONS °Eating and Drinking °· Follow a heart-healthy diet. A dietitian can you help to educate you about healthy food options and changes. °· Use healthy cooking methods such as roasting, grilling, broiling, baking, poaching, steaming, or stir-frying. Talk to a dietitian to learn more about healthy cooking methods. °Medicines °· Take medicines only as directed by your health care provider. °· Do not take the following medicines unless your health care provider approves: °¨ Nonsteroidal anti-inflammatory drugs (NSAIDs), such as ibuprofen, naproxen, or celecoxib. °¨ Vitamin supplements that contain vitamin A, vitamin E, or both. °¨ Hormone replacement therapy that contains estrogen with or without progestin. °· Stop illegal drug use. °Activities °· Follow an exercise program that is approved by your health care provider. °· Plan rest periods when you are fatigued. °Lifestyle °· Do not use any tobacco products, including cigarettes, chewing tobacco, or electronic cigarettes. If you need help quitting, ask your health care provider. °· If you drink alcohol, and your health care provider approves, limit your alcohol intake to no more than 1 drink per day. One drink equals 12 ounces of beer, 5 ounces of wine, or 1½ ounces of hard liquor. °· Learn to manage stress. °· Maintain a healthy weight. Lose weight as approved   by your health care provider. °General Instructions °· Manage other health conditions, such as hypertension and diabetes, as directed by your health care provider. °· Keep all follow-up visits as directed by your health care provider. This is important. °· Your health care provider may ask  you to monitor your blood pressure. A blood pressure reading consists of a higher number over a lower number, such as 110 over 72, written as 110/72. Ideally, your blood pressure should be: °¨ Below 140/90 if you have no other medical conditions. °¨ Below 130/80 if you have diabetes or kidney disease. °SEEK IMMEDIATE MEDICAL CARE IF: °· You have pain in your chest, neck, arm, jaw, stomach, or back that lasts more than a few minutes, is recurring, or is not relieved by taking medicine under your tongue (sublingual nitroglycerin). °· You have profuse sweating without cause. °· You have unexplained: °¨ Heartburn or indigestion. °¨ Shortness of breath or difficulty breathing. °¨ Nausea or vomiting. °¨ Fatigue. °¨ Feelings of nervousness or anxiety. °¨ Weakness. °¨ Diarrhea. °· You have sudden light-headedness or dizziness. °· You faint. °These symptoms may represent a serious problem that is an emergency. Do not wait to see if the symptoms will go away. Get medical help right away. Call your local emergency services (911 in the [place] Do not drive yourself to the clinic or hospital. °  °This information is not intended to replace advice given to you by your health care provider. Make sure you discuss any questions you have with your health care provider. °  °Document Released: 01/15/2005 Document Revised: 02/05/2014 Document Reviewed: 05/19/2013 °Elsevier Interactive Patient Education ©2016 [employer] ° ° ° °

## 2015-07-05 NOTE — Progress Notes (Signed)
CARDIAC REHAB PHASE I   PRE:  Rate/Rhythm: 53 SB  BP:  Sitting: 109/65        SaO2: 100 RA  MODE:  Ambulation: 840 ft   POST:  Rate/Rhythm: 74 SR  BP:  Sitting: 98/63         SaO2: 99 RA  Pt states he is feeling a little better today, less irritable. C/o right arm pain, some mild hip discomfort. Pt ambulated 840 ft on RA, independent, steady gait, tolerated well, no complaints. Pt states he has no questions regarding yesterday's education, states he is concerned that he is no longer able to participate in the twilight study, case management following for medication needs/PCP. Encouraged smoking cessation, pt more receptive today. Pt to bed per pt request after walk, call bell within reach.   [phone] Joylene GrapesEmily C Quinterius Gaida, RN, BSN 07/05/2015 10:48 AM

## 2015-07-05 NOTE — Progress Notes (Signed)
Patient Name: Tommy Malone. Date of Encounter: 07/05/2015  Primary Cardiologist: Dr. Tenny Craw   Principal Problem:   Acute ST elevation myocardial infarction (STEMI) of lateral wall (HCC) Active Problems:   Nicotine addiction   Atherosclerosis of native artery of extremity with intermittent claudication (HCC)   Hyperlipidemia with target LDL less than 70   Tobacco abuse   Subsequent ST elevation (STEMI) myocardial infarction of other sites [facility])    SUBJECTIVE  Denies any CP or SOB.  CURRENT MEDS . aspirin  81 mg Oral Daily  . atorvastatin  80 mg Oral q1800  . metoprolol tartrate  12.5 mg Oral BID  . pantoprazole  40 mg Oral Q0600  . sertraline  100 mg Oral Daily  . sodium chloride flush  3 mL Intravenous Q12H  . ticagrelor  90 mg Oral BID    OBJECTIVE  Filed Vitals:   07/04/15 1200 07/04/15 1245 07/04/15 1956 07/05/15 0433  BP: 107/74 96/61 105/54 102/52  Pulse: 48 51 62 65  Temp:  98.3 F (36.8 C) 98.3 F (36.8 C) 99.3 F (37.4 C)  TempSrc:  Oral Oral Oral  Resp: Height:      Weight:  168 lb (76.204 kg)    SpO2: 99% 100% 99% 97%    Intake/Output Summary (Last 24 hours) at 07/05/15 1109 Last data filed at 07/04/15 1327  Gross per 24 hour  Intake    420 ml  Output      0 ml  Net    420 ml   Filed Weights   07/02/15 2338 07/03/15 0215 07/04/15 1245  Weight: 170 lb (77.111 kg) 167 lb 15.9 oz (76.2 kg) 168 lb (76.204 kg)    PHYSICAL EXAM  General: Pleasant, NAD. Neuro: Alert and oriented X 3. Moves all extremities spontaneously. Psych: Normal affect. HEENT:  Normal  Neck: Supple without bruits or JVD. Lungs:  Resp regular and unlabored, CTA. Heart: RRR no s3, s4, or murmurs. Abdomen: Soft, non-tender, non-distended, BS + x 4.  Extremities: No clubbing, cyanosis or edema. DP/PT/Radials 2+ and equal bilaterally.  Accessory Clinical Findings  CBC  Recent Labs  07/02/15 2339 07/03/15 0533 07/04/15 0218 07/05/15 0312  WBC 10.5 8.3  9.1 9.5  NEUTROABS 6.6 6.1  --   --   HGB 16.0 14.4 15.0 14.8  HCT 48.2 42.9 45.3 44.9  MCV 90.3 88.5 88.8 90.2  PLT 187 170 167 173   Basic Metabolic Panel  Recent Labs  07/02/15 2339 07/03/15 0533  NA 139 138  138  K 3.3* 3.9  3.8  CL 108 107  106  CO2 21* 21*  23  GLUCOSE 98 118*  113*  BUN CREATININE 1.36* 1.13  1.14  CALCIUM 9.2 8.2*  8.3*  MG  --  1.7   Liver Function Tests  Recent Labs  07/02/15 2339 07/03/15 0533  AST 26 35  ALT 23 23  ALKPHOS 75 69  BILITOT 0.6 0.6  PROT 5.9* 5.3*  ALBUMIN 3.4* 3.0*   Cardiac Enzymes  Recent Labs  07/03/15 0533 07/03/15 1054 07/03/15 1447  TROPONINI 3.46*  4.05* 8.32* 6.92*   Hemoglobin A1C  Recent Labs  07/03/15 0533  HGBA1C 5.7*   Fasting Lipid Panel  Recent Labs  07/03/15 0530  CHOL 116  HDL 34*  LDLCALC 68  TRIG 72  CHOLHDL 3.4   Thyroid Function Tests  Recent Labs  07/03/15 0533  TSH 1.530  TELE NSR with HR 50s    ECG  No new EKG  Echocardiogram 07/04/2015  - Left ventricle: The cavity size was normal. Systolic function was  normal. The estimated ejection fraction was in the range of 60%  to 65%. Wall motion was normal; there were no regional wall  motion abnormalities. Left ventricular diastolic function  parameters were normal. - Aortic valve: There was no regurgitation. - Mitral valve: Structurally normal valve. - Right ventricle: The cavity size was normal. Wall thickness was  normal. Systolic function was normal. - Right atrium: The atrium was normal in size. - Tricuspid valve: There was trivial regurgitation. - Pulmonary arteries: Systolic pressure was within the normal  range. - Inferior vena cava: The vessel was normal in size. - Pericardium, extracardiac: There was no pericardial effusion.  Impressions:  - No regional wall motion abnormalities were seen on Definity  images.    Radiology/Studies  Dg Chest Port 1 View  07/03/2015   CLINICAL DATA:  Acute onset of generalized chest pain. Burning and tightness. Diaphoresis. Initial encounter. EXAM: PORTABLE CHEST 1 VIEW COMPARISON:  Chest radiograph performed 02/08/2015 FINDINGS: The lungs are well-aerated. Minimal bibasilar opacities likely reflect atelectasis. There is no evidence of pleural effusion or pneumothorax. The cardiomediastinal silhouette is within normal limits. No acute osseous abnormalities are seen. IMPRESSION: Minimal bibasilar opacities likely reflect atelectasis. Electronically Signed   By: Roanna RaiderJeffery  Chang M.D.   On: 07/03/2015 00:06    ASSESSMENT AND PLAN  1. Lateral STEMI  - cath 07/03/2015 95% ost D2 treated with suboptimal PTCA with 55% residual stenosis, 55% mid LAD, 55% mid LAD ISR treated with PTCA, 99% ost 1st septal perforator, 50% mid RCA.   - Echo 07/04/2015 EF 60-65%  - per research note, it appears Twilight drugs will be discontinued and he has been instructed to return unused study drugs. Likely to avoid him being in the placebo arm of study given recurrent MI  - likely discharge today, no further CP. His HR is in 50s, with frequent drop to mid 40s and even high 30s, if symptomatic, we may have to hold his metoprolol. 7 day TCM followup   2. CAD s/p recent NSTEMI in 12/2014 with DES to LAD  3. HLD: on lipitor  4. Tobacco abuse: repeat discussion regarding cessation  5. PAD s/p aortobifemoral bypass graft by Dr. Imogene Burnhen in 12/2012  Signed, Azalee CourseHao Meng PA-C Pager: [phone]   I have examined the patient and reviewed assessment and plan and discussed with patient.  Agree with above as stated. Stable. No further chest pain.  OK for discharge.  Will have to settle how he will get Brilinta with the case worker.  He [place].    Lance MussJayadeep Sonnet Rizor

## 2015-07-05 NOTE — Research (Signed)
Spoke with patient this morning regarding the [employer] Research Study. Informed patient that he will still be followed via telephone until end of study, but due to the need for DAPT (open label ASA) TWILIGHT drugs will be discontinued. Instructed patient to return unused study drugs. Patient verbalized understanding.

## 2015-07-05 NOTE — Telephone Encounter (Signed)
[facility]  6/14 9am- Sally         930am- Wynema BirchHao

## 2015-07-05 NOTE — Discharge Summary (Signed)
Discharge Summary    Patient ID: Tommy Malone.,  MRN: [id number], DOB/AGE: [birth year] 50 y.o.  Admit date: 07/02/2015 Discharge date: 07/05/2015  Primary Care Provider: Renford Dills D Primary Cardiologist: Dr. Tenny Craw  Discharge Diagnoses    Principal Problem:   Acute ST elevation myocardial infarction (STEMI) of lateral wall (HCC) Active Problems:   Nicotine addiction   Atherosclerosis of native artery of extremity with intermittent claudication (HCC)   Hyperlipidemia with target LDL less than 70   Tobacco abuse   Subsequent ST elevation (STEMI) myocardial infarction of other sites [facility])   Allergies Allergies  Allergen Reactions  . Simvastatin Nausea Only    Stomach aches    Diagnostic Studies/Procedures    Cath 07/03/2015 Conclusion    1. Ost 2nd Diag to 2nd Diag lesion, 95% stenosed. Post PTCA intervention, there is a 55% residual stenosis. 2. Mid LAD lesion, 55% stenosed. Involved in the bifurcation lesion. This is in-stent restenosis. Post intervention - PTCA only, there is a 5% residual stenosis. 3. Ost 1st Sept lesion, 99% stenosed. 4. Mid RCA lesion, 50% stenosed. Dist RCA lesion, 30% stenosed. 5. Prox Cx to Mid Cx lesion, 25% stenosed. 6. Elevated LVEDP  For his lateral ST elevations, likely culprit lesion is the jailed D2 vessel with a 95% stenosis.  Ultimately the PTCA result on D2 was suboptimal, but after 2 angioplasty balloons ruptured at this lesion, I felt it safest to abort any further attempts. There was also given plaque shift into the stented segment of the LAD. This was treated with post-dilation balloon PTCA. Unfortunately in part of this process, the jailed septal perforator trunk with a 99% stenosis that had TIMI 2 flow initially and TIMI 1 flow at the end of the case. The patient was still having 5 out of 10 anginal pain as well as GERD symptoms at the end of the case. He was given 4 mg of morphine, 20 mg Pepcid IV, and the nitroglycerin  glycerin infusion was increased. Upon arrival to the CCU room, the patient was relatively pain-free and feeling better. I suspect his pain was coming from the jailed septal perforator trunk that had TIMI 1 flow at the end of the case, likely related to increased LVEDP from systemic hypertension leading to high wall stress.   Plan:  Admit to CCU for post PTCA/PCI care.  TR band removal per protocol  Aggrastat infusion for 6 hours  Consider IV heparin for 24 hours, starting 8 hours after sheath removal  Nitrogen infusion for afterload reduction  1 dose of IV Lasix to be administered upon arrival CCU. - 20 mg  When necessary morphine for pain control  Based on this presentation, I would prefer not to fast track, however if he looks stable by the next day, to consider. He is already on Brilinta, but would need care management assistance to see if we can figure out a way to get his medications on discharge. Apparently has not been taking a lot of his medications besides Brilinta.  He needs to stop Smoking.     Echo 07/04/2015 LV EF: 60% - 65%  ------------------------------------------------------------------- Indications: CAD of native vessels 414.01.  ------------------------------------------------------------------- History: PMH: Coronary artery disease. PMH: Myocardial infarction.  ------------------------------------------------------------------- Study Conclusions  - Left ventricle: The cavity size was normal. Systolic function was  normal. The estimated ejection fraction was in the range of 60%  to 65%. Wall motion was normal; there were no regional wall  motion abnormalities. Left ventricular diastolic  function  parameters were normal. - Aortic valve: There was no regurgitation. - Mitral valve: Structurally normal valve. - Right ventricle: The cavity size was normal. Wall thickness was  normal. Systolic function was normal. - Right atrium: The  atrium was normal in size. - Tricuspid valve: There was trivial regurgitation. - Pulmonary arteries: Systolic pressure was within the normal  range. - Inferior vena cava: The vessel was normal in size. - Pericardium, extracardiac: There was no pericardial effusion.  Impressions:  - No regional wall motion abnormalities were seen on Definity  images. _____________   History of Present Illness      This is a 50 y.o. male with a history of tobacco abuse ongoing, untreated hyperlipidemia, lower extremity PAD s/p aortobifemoral bypass graft by Dr. Imogene Burn in 12/2012, and bilateral carotid artery disease . Myoview stress test in 2014 was normal without ischemia, LVEF was normal 57%. He also has bilateral carotid artery disease with evidence of left subclavian stenosis. Carotid Doppler performed in January 2015 showed 40% right ICA stenosis and 40-59% left ICA stenosis. There was no documentation of the degree of his left subclavian stenosis.   He presented with CP and NSTEMI in 12/2014 and Cardiac catheterization showed 99% proximal LAD lesion treated with DES (Xience 3.5 x 33 mm DES), 60% D2 stenosis, 70% proximal to mid LAD lesion covered with same stent, 50% mid RCA lesion, 30% distal RCA lesion, EF 45-50%. Postprocedure, he was enrolled in [place] trial and placed on aspirin and Brilinta. Echocardiogram was obtained on 12/9 which showed EF 55-60%. He has been doing well since then with no complaints of angina until this evening. He says that he was getting ready to finish work and was cleaning up. He has sudden onset of SSCP with no radiation but associated with severe diaphoresis and SOB. Pain was identical to his prior MI. He took a SL NTG without any improvement and called EMS. He was given ASA and Heparin bolus 5000 units. EKG showed ST elevated in V1 and V2, aVL and I with reciprocal ST depression in the inferior leads and code STEMI was called. In ER patient continues to complain of  CP. Now started on IV NTG gtt.   Hospital Course     Patient underwent cardiac catheterization on 07/03/2015 95% ost D2 treated with suboptimal PTCA with 55% residual stenosis, 55% mid LAD, 55% mid LAD ISR treated with PTCA, 99% ost 1st septal perforator, 50% mid RCA. Echocardiogram obtained on 07/04/2015 showed EF 60-65%. Unfortunately during the cardiac catheterization septal perforator was jailed, post cath, his troponin trended up to 8.32.  He was seen in the morning of 07/05/2015, at which time he denies any further chest discomfort. Due to his social issues and financial issues acquiring medications, we have consulted case manager who has given him prescription for Brilinta. I have also filled out medication assistance form for him. I have arranged one week transition of care follow-up in the cardiology clinic. Otherwise he is deemed stable for discharge from cardiology perspective. I have given him one month prescription for Zoloft which appears to be his previous home medication, he will need to follow-up with his PCP if he needs more. I have instructed to follow-up with his PCP if he needs his home Klonopin.    _____________  Discharge Vitals Blood pressure 118/67, pulse 58, temperature 98.5 F (36.9 C), temperature source Oral, resp. rate 19, height 5\' 9"  (1.753 m), weight 168 lb (76.204 kg), SpO2 100 %.  Filed Weights  07/02/15 2338 07/03/15 0215 07/04/15 1245  Weight: 170 lb (77.111 kg) 167 lb 15.9 oz (76.2 kg) 168 lb (76.204 kg)    Labs & Radiologic Studies     CBC  Recent Labs  07/02/15 2339 07/03/15 0533 07/04/15 0218 07/05/15 0312  WBC 10.5 8.3 9.1 9.5  NEUTROABS 6.6 6.1  --   --   HGB 16.0 14.4 15.0 14.8  HCT 48.2 42.9 45.3 44.9  MCV 90.3 88.5 88.8 90.2  PLT 187 170 167 173   Basic Metabolic Panel  Recent Labs  07/02/15 2339 07/03/15 0533  NA 139 138  138  K 3.3* 3.9  3.8  CL 108 107  106  CO2 21* 21*  23  GLUCOSE 98 118*  113*  BUN 10 9  10     CREATININE 1.36* 1.13  1.14  CALCIUM 9.2 8.2*  8.3*  MG  --  1.7   Liver Function Tests  Recent Labs  07/02/15 2339 07/03/15 0533  AST 26 35  ALT 23 23  ALKPHOS 75 69  BILITOT 0.6 0.6  PROT 5.9* 5.3*  ALBUMIN 3.4* 3.0*   Cardiac Enzymes  Recent Labs  07/03/15 0533 07/03/15 1054 07/03/15 1447  TROPONINI 3.46*  4.05* 8.32* 6.92*   Hemoglobin A1C  Recent Labs  07/03/15 0533  HGBA1C 5.7*   Fasting Lipid Panel  Recent Labs  07/03/15 0530  CHOL 116  HDL 34*  LDLCALC 68  TRIG 72  CHOLHDL 3.4   Thyroid Function Tests  Recent Labs  07/03/15 0533  TSH 1.530    Dg Chest Port 1 View  07/03/2015  CLINICAL DATA:  Acute onset of generalized chest pain. Burning and tightness. Diaphoresis. Initial encounter. EXAM: PORTABLE CHEST 1 VIEW COMPARISON:  Chest radiograph performed 02/08/2015 FINDINGS: The lungs are well-aerated. Minimal bibasilar opacities likely reflect atelectasis. There is no evidence of pleural effusion or pneumothorax. The cardiomediastinal silhouette is within normal limits. No acute osseous abnormalities are seen. IMPRESSION: Minimal bibasilar opacities likely reflect atelectasis. Electronically Signed   By: Roanna RaiderJeffery  Chang M.D.   On: 07/03/2015 00:06    Disposition   Pt is being discharged home today in good condition.  Follow-up Plans & Appointments    Follow-up Information    Follow up with Norma FredricksonLORI GERHARDT, NP On 07/11/2015.   Specialties:  Nurse Practitioner, Interventional Cardiology, Cardiology, Radiology   Why:  2:30 visit with pharmcist and 3:00PM with Norma FredricksonLori Gerhardt. Cardiology visit with Dr. Charlott Rakesoss's PA/NP   Contact information:   [address] [place] [address] [phone]       Schedule an appointment as soon as possible for a visit with Katy ApoPOLITE,RONALD D, MD.   Specialty:  Internal Medicine   Contact information:   [address] [place] [address] [phone]      Discharge Instructions     Amb Referral to Cardiac Rehabilitation    Complete by:  As directed   Diagnosis:  PTCA           Discharge Medications   Current Discharge Medication List    START taking these medications   Details  aspirin 81 MG chewable tablet Chew 1 tablet (81 mg total) by mouth daily.    pantoprazole (PROTONIX) 40 MG tablet Take 1 tablet (40 mg total) by mouth daily at 6 (six) AM. Qty: 90 tablet, Refills: 3    sertraline (ZOLOFT) 100 MG tablet Take 1 tablet (100 mg total) by mouth daily. Qty: 30 tablet, Refills: 0  ticagrelor (BRILINTA) 90 MG TABS tablet Take 1 tablet (90 mg total) by mouth 2 (two) times daily. Qty: 60 tablet, Refills: 11      CONTINUE these medications which have NOT CHANGED   Details  atorvastatin (LIPITOR) 80 MG tablet Take 1 tablet (80 mg total) by mouth daily at 6 PM. Qty: 30 tablet, Refills: 5    ibuprofen (ADVIL,MOTRIN) 200 MG tablet Take 400-600 mg by mouth every 6 (six) hours as needed for headache (pain).    metoprolol tartrate (LOPRESSOR) 25 MG tablet Take 0.5 tablets (12.5 mg total) by mouth 2 (two) times daily. Qty: 60 tablet, Refills: 5    nitroGLYCERIN (NITROSTAT) 0.4 MG SL tablet Place 1 tablet (0.4 mg total) under the tongue every 5 (five) minutes as needed for chest pain (x 3 doses). Qty: 25 tablet, Refills: 2    varenicline (CHANTIX STARTING MONTH PAK) 0.5 MG X 11 & 1 MG X 42 tablet Take one 0.5 mg tablet by mouth once daily for 3 days, then increase to one 0.5 mg tablet twice daily for 4 days, then increase to one 1 mg tablet twice daily. Qty: 53 tablet, Refills: 0      STOP taking these medications     AMBULATORY NON FORMULARY MEDICATION      AMBULATORY NON FORMULARY MEDICATION      aspirin EC 81 MG tablet          Aspirin prescribed at discharge?  Yes High Intensity Statin Prescribed? (Lipitor 40-80mg  or Crestor 20-40mg ): Yes Beta Blocker Prescribed? Yes For EF 45% or less, Was ACEI/ARB Prescribed? No: EF normal ADP Receptor  Inhibitor Prescribed? (i.e. Plavix etc.-Includes Medically Managed Patients): Yes For EF <40%, Aldosterone Inhibitor Prescribed? No: EF normal Was EF assessed during THIS hospitalization? Yes Was Cardiac Rehab II ordered? (Included Medically managed Patients): Yes   Outstanding Labs/Studies   None  Duration of Discharge Encounter   Greater than 30 minutes including physician time.  Signed, Azalee Course PA-C 07/05/2015, 1:46 PM    I have examined the patient and reviewed assessment and plan and discussed with patient.  Agree with above as stated.  Stable. No further chest pain. OK for discharge. Will have to settle how he will get Brilinta with the case worker. He will no longer be in the Twilight trial. EF 60-65% by echo.  Lance Muss

## 2015-07-05 NOTE — Care Management Note (Signed)
Case Management Note Previous CM note initiated by Dorcas Carroweborah Dowell RN, CM  Patient Details  Name: Tommy CapersLarry W Pineau Jr. MRN: [id number] Date of Birth: 09/05/1965  Subjective/Objective:      Adm w mi              Action/Plan: lives w fam, pcp dr polite   Expected Discharge Date:   07/05/15               Expected Discharge Plan:  Home/Self Care  In-House Referral:     Discharge planning Services  CM Consult, Medication Assistance  Post Acute Care Choice:    Choice offered to:     DME Arranged:    DME Agency:     HH Arranged:    HH Agency:     Status of Service:  Completed, signed off  Medicare Important Message Given:    Date Medicare IM Given:    Medicare IM give by:    Date Additional Medicare IM Given:    Additional Medicare Important Message give by:     If discussed at Long Length of Stay Meetings, dates discussed:    Additional Comments:   07/05/15- 1130- Donn PieriniKristi [place] Shell[place] RN, BSN- pt for d/c home today- was in [employer] Research Study- but now per note in chart will no longer be in study - will need assistance with Brilinta as pt will no loner be getting drug for free. Spoke with pt at bedside who confirmed that his PCP is Dr. Nehemiah SettlePolite- pt currently does not have insurance and has not been able to afford to go to PCP due to copay cost- but pt reports that he recently started a new job and should have insurance within the next 1-2 mo. Pt does report that he has filled out paperwork for an orange card about 4 wks ago but has not heard anything back this he states was through Dr. Tenny Crawoss- encouraged pt to f/u with Dr. Tenny Crawoss office for a # to contact to f/u on this paperwork. Pt reports that he has been using medication assistance with [facility] for some medications.- He states that he still has some meds at home but some he has run out of has requested to see if discharging MD is willing to write for 30 day script for Zoloft 100 mg/Klonopin 0.5mg /Prilosec 20 mg- have ask PA regarding this and  they are willing to give pt scripts for Zoloft and Prilosec at discharge however state that pt will need to see his PCP regarding the Klonopin. Have given pt the 30 day free card for Brilinta and also the pt assistance application which has already been filled out by presciber- pt to f/u with filling out his part and getting needed info then faxing application for further assistance. Pt updated on discharge scripts.    Per CM- Dorcas Carroweborah Dowell- pt states on study and gets brilinta for free.  Pt has applied for orange card. Gave him phone number to call and ck on his application. He will ck into medicaid he has minor children in the home. Gave [place] list including [facility] cell [facility].   Darrold SpanWebster, Almendra Loria Hall, RN 07/05/2015, 12:37 PM

## 2015-07-05 NOTE — Telephone Encounter (Signed)
F/U CORRECTION 6/12 @ 2:30- Kennon RoundsSally 6/12 @ 3:00- Lawson FiscalLori

## 2015-07-06 NOTE — Telephone Encounter (Signed)
[facility]  Patient understands to follow up with provider ? 6/12 @ 9am with PharmD; 9:30am [address] office; pt to arrive 15 mins. early Patient understands discharge instructions? Yes  Patient understands medications and regiment? Yes   Patient understands to bring all medications to this visit? Yes   Per pt request appts. Move up to morning from 2:30pm and 3pm on 6/12 as pt is hoping to be cleared to return to work and go back that afternoon as he works 2nd shift.

## 2015-07-10 NOTE — Progress Notes (Signed)
Patient ID: Tommy Malone.                 DOB: 11-29-1965                      MRN: [id number]    Pharmacy Transitions of Care Visit  HPI: Rodderick Holtzer. is a 50 y.o. male discharged on 07/05/15 with primary diagnosis of STEMI.  PMH is significant for tobacco abuse, hyperlipidemia, lower extremity PAD and bilateral carotid artery disease.  Pt presented to hospital on 6/3 complaining of sudden onset of chest pain.  He took a NTG and called EMS.  Code STEMI called.  Pt was discharged on 6/6.  Patient presents to clinic for pharmacy transitions of care medication reconciliation after hospital discharge.  He does not have prescription insurance at this time.  He was given the application for an orange card while at the hospital but states he expects to have insurance through work within the next few weeks.  All medications have been reviewed with patient.  He was getting his Brilinta through the [facility] study but given the ASA would potentially change to placebo in a few months, he has decided to not participate in the study.  He still has some Brilinta left from that bottle and the 30 day free card.  He does state that protonix does not control his GERD as well as omeprazole so he would like to change back.  He continues to smoke.  He states he does not have the money to buy Chantix.  He used this in the past to quit smoking for ~16 months.  He started smoking again after someone offered him a cigar.  He smokes ~1ppd.  He has tried nicotine products in the past with no success.    Pt also states he needs a PCP to help refill his Zoloft and klonopin.     Past Medical History  Diagnosis Date  . Acid reflux   . Depression   . Anxiety   . Peripheral vascular disease (HCC)   . Hemorrhoid   . NSTEMI (non-ST elevated myocardial infarction) (HCC) 12/2014  . CAD S/P percutaneous coronary angioplasty 01/06/2015    a. NSTEMI - Xience DES 3.5 x 33 p-mLAD; 60% D1, 50% mRCA   b. 95% ost D2 treated with  suboptimal PTCA with 55% residual stenosis, 55% mid LAD, 55% mid LAD ISR treated with PTCA, 99% ost 1st septal perforator, 50% mid RCA  . History of kidney stones     Current Outpatient Prescriptions on File Prior to Visit  Medication Sig Dispense Refill  . aspirin 81 MG chewable tablet Chew 1 tablet (81 mg total) by mouth daily.    [name] atorvastatin (LIPITOR) 80 MG tablet Take 1 tablet (80 mg total) by mouth daily at 6 PM. 30 tablet 5  . ibuprofen (ADVIL,MOTRIN) 200 MG tablet Take 400-600 mg by mouth every 6 (six) hours as needed for headache (pain).    . metoprolol tartrate (LOPRESSOR) 25 MG tablet Take 0.5 tablets (12.5 mg total) by mouth 2 (two) times daily. 60 tablet 5  . nitroGLYCERIN (NITROSTAT) 0.4 MG SL tablet Place 1 tablet (0.4 mg total) under the tongue every 5 (five) minutes as needed for chest pain (x 3 doses). 25 tablet 2  . pantoprazole (PROTONIX) 40 MG tablet Take 1 tablet (40 mg total) by mouth daily at 6 (six) AM. 90 tablet 3  . sertraline (ZOLOFT) 100 MG tablet Take 1  tablet (100 mg total) by mouth daily. 30 tablet 0  . ticagrelor (BRILINTA) 90 MG TABS tablet Take 1 tablet (90 mg total) by mouth 2 (two) times daily. 60 tablet 11  . varenicline (CHANTIX STARTING MONTH PAK) 0.5 MG X 11 & 1 MG X 42 tablet Take one 0.5 mg tablet by mouth once daily for 3 days, then increase to one 0.5 mg tablet twice daily for 4 days, then increase to one 1 mg tablet twice daily. (Patient not taking: Reported on 04/01/2015) 53 tablet 0   No current facility-administered medications on file prior to visit.    Allergies  Allergen Reactions  . Simvastatin Nausea Only    Stomach aches    Assessment/Plan:  1.  CAD- Pt doing well since discharge.  He has all of his medications and on appropriate therapy.  BP on lower side to consider ACE-I or ARB at this moment.  Discussed smoking cessation.  He wants to restart Chantix.  Gave him a 2 week sample and sent Rx for continuing month.  Also changed  protonix back to omeprazole since it works better for patient.  If he has to change to Plavix due to cost in the future will need to adjust PPI.

## 2015-07-11 ENCOUNTER — Encounter: Payer: Self-pay | Admitting: Nurse Practitioner

## 2015-07-11 ENCOUNTER — Ambulatory Visit (INDEPENDENT_AMBULATORY_CARE_PROVIDER_SITE_OTHER): Payer: Self-pay | Admitting: Nurse Practitioner

## 2015-07-11 ENCOUNTER — Ambulatory Visit (INDEPENDENT_AMBULATORY_CARE_PROVIDER_SITE_OTHER): Payer: Self-pay | Admitting: Pharmacist

## 2015-07-11 VITALS — BP 104/72 | HR 56 | Wt 168.5 lb

## 2015-07-11 VITALS — BP 104/72 | HR 50 | Ht 69.0 in | Wt 168.0 lb

## 2015-07-11 DIAGNOSIS — Z9861 Coronary angioplasty status: Secondary | ICD-10-CM

## 2015-07-11 DIAGNOSIS — I213 ST elevation (STEMI) myocardial infarction of unspecified site: Secondary | ICD-10-CM

## 2015-07-11 DIAGNOSIS — E785 Hyperlipidemia, unspecified: Secondary | ICD-10-CM

## 2015-07-11 DIAGNOSIS — I2510 Atherosclerotic heart disease of native coronary artery without angina pectoris: Secondary | ICD-10-CM

## 2015-07-11 DIAGNOSIS — Z720 Tobacco use: Secondary | ICD-10-CM

## 2015-07-11 DIAGNOSIS — I739 Peripheral vascular disease, unspecified: Secondary | ICD-10-CM

## 2015-07-11 LAB — CBC
HCT: 44 % (ref 38.5–50.0)
Hemoglobin: 14.9 g/dL (ref 13.2–17.1)
MCH: 30.1 pg (ref 27.0–33.0)
MCHC: 33.9 g/dL (ref 32.0–36.0)
MCV: 88.9 fL (ref 80.0–100.0)
MPV: 11.2 fL (ref 7.5–12.5)
Platelets: 237 10*3/uL (ref 140–400)
RBC: 4.95 MIL/uL (ref 4.20–5.80)
RDW: 13 % (ref 11.0–15.0)
WBC: 7.5 10*3/uL (ref 3.8–10.8)

## 2015-07-11 LAB — BASIC METABOLIC PANEL
BUN: 14 mg/dL (ref 7–25)
CO2: 24 mmol/L (ref 20–31)
Calcium: 9 mg/dL (ref 8.6–10.3)
Chloride: 107 mmol/L (ref 98–110)
Creat: 1.05 mg/dL (ref 0.60–1.35)
Glucose, Bld: 81 mg/dL (ref 65–99)
Potassium: 4.3 mmol/L (ref 3.5–5.3)
Sodium: 139 mmol/L (ref 135–146)

## 2015-07-11 MED ORDER — OMEPRAZOLE 20 MG PO CPDR: 20.0000 mg | DELAYED_RELEASE_CAPSULE | Freq: Every day | ORAL | Status: DC

## 2015-07-11 MED ORDER — VARENICLINE TARTRATE 1 MG PO TABS: 1.0000 mg | ORAL_TABLET | Freq: Two times a day (BID) | ORAL | Status: DC

## 2015-07-11 NOTE — Patient Instructions (Addendum)
We will be checking the following labs today - BMET and CBC   Medication Instructions:    Continue with your current medicines.    Call us if you are running low on Brilinta    Testing/Procedures To Be Arranged:  N/A  Follow-Up:   See me back in about 4 to 6 weeks (has recall in system) with fasting labs.  See Dr. Tenny Crawoss in next 2 to 3 months    Other Special Instructions:   Keep working on stop smoking.     If you need a refill on your cardiac medications before your next appointment, please call your pharmacy.   Call the [facility] office at [phone] if you have any questions, problems or concerns.

## 2015-07-11 NOTE — Progress Notes (Signed)
CARDIOLOGY OFFICE NOTE  Date:  07/11/2015    Tommy Malone. Date of Birth: [birth year] Medical Record #[id number]  PCP:  Katy Apo, MD  Cardiologist:  Tenny Craw    Chief Complaint  Patient presents with  . Coronary Artery Disease    TOC - seen for Dr. Tenny Craw    History of Present Illness: Tommy Malone. is a 50 y.o. male who presents today for a TOC visit. Seen for Dr. Tenny Craw.    He has a history of tobacco abuse ongoing, untreated hyperlipidemia, lower extremity PAD s/p aortobifemoral bypass graft by Dr. Imogene Burn in 12/2012, and bilateral carotid artery disease . Myoview stress test in 2014 was normal without ischemia, LVEF was normal 57%. He also has bilateral carotid artery disease with evidence of left subclavian stenosis. Carotid Doppler performed in January 2015 showed 40% right ICA stenosis and 40-59% left ICA stenosis. There was no documentation of the degree of his left subclavian stenosis.   He presented with CP and NSTEMI in 12/2014 and Cardiac catheterization showed 99% proximal LAD lesion treated with DES (Xience 3.5 x 33 mm DES), 60% D2 stenosis, 70% proximal to mid LAD lesion covered with same stent, 50% mid RCA lesion, 30% distal RCA lesion, EF 45-50%. Postprocedure, he was enrolled in [place] trial and placed on aspirin and Brilinta. Echocardiogram was obtained on 12/9 which showed EF 55-60%. He has been doing well since then with no complaints of angina until this evening. Earlier this month, he had sudden onset of SSCP with no radiation but associated with severe diaphoresis and SOB. Pain was identical to his prior MI. He took a SL NTG without any improvement and called EMS. He was given ASA and Heparin bolus 5000 units. EKG showed ST elevated in V1 and V2, aVL and I with reciprocal ST depression in the inferior leads and code STEMI was called.    Patient underwent cardiac catheterization on 07/03/2015 95% ost D2 treated with suboptimal PTCA with 55% residual  stenosis, 55% mid LAD, 55% mid LAD ISR treated with PTCA, 99% ost 1st septal perforator, 50% mid RCA. Echocardiogram obtained on 07/04/2015 showed EF 60-65%. Unfortunately during the cardiac catheterization septal perforator was jailed, post cath, his troponin trended up to 8.32. He did well clinically.      Comes back today. Here alone. He has seen the pharmacist here earlier this morning. No real problems. Needs to get back to work so he can earn money. He is still smoking. No more chest pain. He will be switching over to Prilosec - works better for him than Protonix. Apparently his GI symptoms are very similar to his heart symptoms. Extensive bruising in the right arm. Not short of breath. He says he will have insurance in just a few weeks. No longer in the TWILIGHT study but has the sample bottle of Brilinta. Going to restart Chantix - this has worked for him before. He understands the findings from the cardiac cath. He is pleased with how he is currently doing.   Past Medical History  Diagnosis Date  . Acid reflux   . Depression   . Anxiety   . Peripheral vascular disease (HCC)   . Hemorrhoid   . NSTEMI (non-ST elevated myocardial infarction) (HCC) 12/2014  . CAD S/P percutaneous coronary angioplasty 01/06/2015    a. NSTEMI - Xience DES 3.5 x 33 p-mLAD; 60% D1, 50% mRCA   b. 95% ost D2 treated with suboptimal PTCA with 55% residual stenosis,  55% mid LAD, 55% mid LAD ISR treated with PTCA, 99% ost 1st septal perforator, 50% mid RCA  . History of kidney stones     Past Surgical History  Procedure Laterality Date  . Elbow surgery Bilateral   . Kidney stone surgery      x2  . Aorta - bilateral femoral artery bypass graft N/A 01/07/2013    Procedure: AORTA BIFEMORAL BYPASS GRAFT;  Surgeon: Fransisco Hertz, MD;  Location: [facility] OR;  Service: Vascular;  Laterality: N/A;  . Abdominal aortagram N/A 12/18/2012    Procedure: ABDOMINAL AORTAGRAM;  Surgeon: Fransisco Hertz, MD;  Location: [facility] CATH LAB;   Service: Cardiovascular;  Laterality: N/A;  . Cardiac catheterization N/A 01/06/2015    Procedure: Left Heart Cath and Coronary Angiography;  Surgeon: Kathleene Hazel, MD;  Location: [facility] INVASIVE CV LAB;  Service: Cardiovascular;  Laterality: N/A;  . Cardiac catheterization N/A 01/06/2015    Procedure: Coronary Stent Intervention;  Surgeon: Kathleene Hazel, MD;  Location: [facility] INVASIVE CV LAB;  Service: Cardiovascular: Xience DES 3.5 mm x 33 mm p-mLAD  . Cardiac catheterization N/A 07/02/2015    Procedure: Left Heart Cath and Coronary Angiography;  Surgeon: Marykay Lex, MD;  Location: [facility] INVASIVE CV LAB;  Service: Cardiovascular;  Laterality: N/A;  . Cardiac catheterization N/A 07/02/2015    Procedure: Coronary Stent Intervention;  Surgeon: Marykay Lex, MD;  Location: [facility] INVASIVE CV LAB;  Service: Cardiovascular;  Laterality: N/A;     Medications: Current Outpatient Prescriptions  Medication Sig Dispense Refill  . aspirin 81 MG chewable tablet Chew 1 tablet (81 mg total) by mouth daily.    [name] atorvastatin (LIPITOR) 80 MG tablet Take 1 tablet (80 mg total) by mouth daily at 6 PM. 30 tablet 5  . ibuprofen (ADVIL,MOTRIN) 200 MG tablet Take 400-600 mg by mouth every 6 (six) hours as needed for headache (pain).    . metoprolol tartrate (LOPRESSOR) 25 MG tablet Take 0.5 tablets (12.5 mg total) by mouth 2 (two) times daily. 60 tablet 5  . nitroGLYCERIN (NITROSTAT) 0.4 MG SL tablet Place 1 tablet (0.4 mg total) under the tongue every 5 (five) minutes as needed for chest pain (x 3 doses). 25 tablet 2  . omeprazole (PRILOSEC) 20 MG capsule Take 1 capsule (20 mg total) by mouth daily. 30 capsule 11  . sertraline (ZOLOFT) 100 MG tablet Take 1 tablet (100 mg total) by mouth daily. 30 tablet 0  . ticagrelor (BRILINTA) 90 MG TABS tablet Take 1 tablet (90 mg total) by mouth 2 (two) times daily. 60 tablet 11  . varenicline (CHANTIX CONTINUING MONTH PAK) 1 MG tablet Take 1 tablet (1 mg total) by  mouth 2 (two) times daily. 60 tablet 1   No current facility-administered medications for this visit.    Allergies: Allergies  Allergen Reactions  . Simvastatin Nausea Only    Stomach aches    Social History: The patient  reports that he has been smoking Cigarettes and E-cigarettes.  He has a 30 pack-year smoking history. He has never used smokeless tobacco. He reports that he does not drink alcohol or use illicit drugs.   Family History: The patient's family history includes Cancer in his paternal grandfather; Heart attack in his father and mother; Heart disease in his father, maternal grandmother, and mother; Hyperlipidemia in his father and mother.   Review of Systems: Please see the history of present illness.   Otherwise, the review of systems is positive for none.  All other systems are reviewed and negative.   Physical Exam: VS:  BP 104/72 mmHg  Pulse 50  Ht  (1.753 m)  Wt 168 lb (76.204 kg)  BMI 24.80 kg/m2 .  BMI Body mass index is 24.8 kg/(m^2).  Wt Readings from Last 3 Encounters:  07/11/15 168 lb (76.204 kg)  07/11/15 168 lb 8 oz (76.431 kg)  07/04/15 168 lb (76.204 kg)    General: Pleasant. Well developed, well nourished and in no acute distress. Looks older than his stated age.  HEENT: Normal. Neck: Supple, no JVD, carotid bruits, or masses noted.  Cardiac: Regular rate and rhythm. No murmurs, rubs, or gallops. No edema.  Respiratory:  Lungs are coarse with normal work of breathing.  GI: Soft and nontender.  MS: No deformity or atrophy. Gait and ROM intact. Skin: Warm and dry. Color is normal.  Neuro:  Strength and sensation are intact and no gross focal deficits noted.  Psych: Alert, appropriate and with normal affect. Right arm with extensive but soft bruising. Good +2 radial pulse on the right.    LABORATORY DATA:  EKG:  EKG is ordered today. This shows sinus bradycardia with lateral T wave changes.   Lab Results  Component Value Date   WBC  9.5 07/05/2015   HGB 14.8 07/05/2015   HCT 44.9 07/05/2015   PLT 173 07/05/2015   GLUCOSE 118* 07/03/2015   GLUCOSE 113* 07/03/2015   CHOL 116 07/03/2015   TRIG 72 07/03/2015   HDL 34* 07/03/2015   LDLCALC 68 07/03/2015   ALT 23 07/03/2015   AST 35 07/03/2015   NA 138 07/03/2015   NA 138 07/03/2015   K 3.9 07/03/2015   K 3.8 07/03/2015   CL 107 07/03/2015   CL 106 07/03/2015   CREATININE 1.13 07/03/2015   CREATININE 1.14 07/03/2015   BUN 9 07/03/2015   BUN 10 07/03/2015   CO2 21* 07/03/2015   CO2 23 07/03/2015   TSH 1.530 07/03/2015   INR 1.08 07/03/2015   INR 1.08 07/03/2015   HGBA1C 5.7* 07/03/2015    BNP (last 3 results) No results for input(s): BNP in the last 8760 hours.  ProBNP (last 3 results) No results for input(s): PROBNP in the last 8760 hours.   Other Studies Reviewed Today: Cath 07/03/2015 Conclusion    1. Ost 2nd Diag to 2nd Diag lesion, 95% stenosed. Post PTCA intervention, there is a 55% residual stenosis. 2. Mid LAD lesion, 55% stenosed. Involved in the bifurcation lesion. This is in-stent restenosis. Post intervention - PTCA only, there is a 5% residual stenosis. 3. Ost 1st Sept lesion, 99% stenosed. 4. Mid RCA lesion, 50% stenosed. Dist RCA lesion, 30% stenosed. 5. Prox Cx to Mid Cx lesion, 25% stenosed. 6. Elevated LVEDP  For his lateral ST elevations, likely culprit lesion is the jailed D2 vessel with a 95% stenosis.  Ultimately the PTCA result on D2 was suboptimal, but after 2 angioplasty balloons ruptured at this lesion, I felt it safest to abort any further attempts. There was also given plaque shift into the stented segment of the LAD. This was treated with post-dilation balloon PTCA. Unfortunately in part of this process, the jailed septal perforator trunk with a 99% stenosis that had TIMI 2 flow initially and TIMI 1 flow at the end of the case. The patient was still having 5 out of 10 anginal pain as well as GERD symptoms at the end of  the case. He was given 4 mg of morphine,  20 mg Pepcid IV, and the nitroglycerin glycerin infusion was increased. Upon arrival to the CCU room, the patient was relatively pain-free and feeling better. I suspect his pain was coming from the jailed septal perforator trunk that had TIMI 1 flow at the end of the case, likely related to increased LVEDP from systemic hypertension leading to high wall stress.   Plan:  Admit to CCU for post PTCA/PCI care.  TR band removal per protocol  Aggrastat infusion for 6 hours  Consider IV heparin for 24 hours, starting 8 hours after sheath removal  Nitrogen infusion for afterload reduction  1 dose of IV Lasix to be administered upon arrival CCU. - 20 mg  When necessary morphine for pain control  Based on this presentation, I would prefer not to fast track, however if he looks stable by the next day, to consider. He is already on Brilinta, but would need care management assistance to see if we can figure out a way to get his medications on discharge. Apparently has not been taking a lot of his medications besides Brilinta.  He needs to stop Smoking.        Echo Study Conclusions 06/2015  - Left ventricle: The cavity size was normal. Systolic function was  normal. The estimated ejection fraction was in the range of 60%  to 65%. Wall motion was normal; there were no regional wall  motion abnormalities. Left ventricular diastolic function  parameters were normal. - Aortic valve: There was no regurgitation. - Mitral valve: Structurally normal valve. - Right ventricle: The cavity size was normal. Wall thickness was  normal. Systolic function was normal. - Right atrium: The atrium was normal in size. - Tricuspid valve: There was trivial regurgitation. - Pulmonary arteries: Systolic pressure was within the normal  range. - Inferior vena cava: The vessel was normal in size. - Pericardium, extracardiac: There was no pericardial  effusion.  Impressions:  - No regional wall motion abnormalities were seen on Definity  images.  Assessment/Plan: 1. CAD with recurrent angina - s/p cardiac cath with angioplasty to 1st diagonal - has had "jailed" septal perforator - angioplasty result not ideal. Currently without symptoms. He is doing ok. Continue Brilinta and aspirin along with CV risk factor modification. Recheck BMET and CBC today. I have given him a note to return to work - he needs the money - he can minimize his lifting for the next wee or so. Also gave a note saying he had been hospitalized and not able to work last week for court needs.   2. HLD - on statin - needs labs on return  3. Tobacco abuse - total cessation encouraged. He is going to restart Chantix. He has a coupon card.   4. GERD - ok to switch back over to Prilosec - this has worked well for him the in the past.   5. PVD  Current medicines are reviewed with the patient today.  The patient does not have concerns regarding medicines other than what has been noted above.  The following changes have been made:  See above.  Labs/ tests ordered today include:   No orders of the defined types were placed in this encounter.     Disposition:   FU with me in 4 to 6 weeks with fasting labs. Will get him back to Dr. Tenny Craw in next few months.   Patient is agreeable to this plan and will call if any problems develop in the interim.   Signed: Jennet Maduro.  Tyrone SageGerhardt, RN, ANP-C 07/11/2015 10:03 AM  [facility] [address] [place], [place]  [address] Phone: [phone] Fax: [phone]

## 2015-07-13 ENCOUNTER — Encounter: Payer: Self-pay | Admitting: Physician Assistant

## 2015-08-23 ENCOUNTER — Ambulatory Visit: Payer: Self-pay | Admitting: Nurse Practitioner

## 2015-09-02 ENCOUNTER — Telehealth (HOSPITAL_COMMUNITY): Payer: Self-pay | Admitting: Cardiac Rehabilitation

## 2015-09-02 ENCOUNTER — Ambulatory Visit: Payer: Self-pay | Admitting: Nurse Practitioner

## 2015-09-02 NOTE — Telephone Encounter (Signed)
pc to pt to discuss enrolling in cardiac rehab. Pt declined due to work schedule.  

## 2015-09-07 ENCOUNTER — Ambulatory Visit: Payer: Self-pay | Admitting: Nurse Practitioner

## 2015-09-16 ENCOUNTER — Encounter: Payer: Self-pay | Admitting: Nurse Practitioner

## 2015-09-29 ENCOUNTER — Encounter: Payer: Self-pay | Admitting: *Deleted

## 2015-09-30 ENCOUNTER — Ambulatory Visit: Payer: Self-pay | Admitting: Nurse Practitioner

## 2015-09-30 NOTE — Progress Notes (Deleted)
CARDIOLOGY OFFICE NOTE  Date:  09/30/2015    Tommy Malone. Date of Birth: 27-Nov-1965 Medical Record #[id number]  PCP:  Katy Apo, MD  Cardiologist:  Tyrone Sage & ***    No chief complaint on file.   History of Present Illness: Tommy Malone. is a 50 y.o. male who presents today for a ***   Comes in today. Here with   Past Medical History:  Diagnosis Date  . Acid reflux   . Anxiety   . CAD S/P percutaneous coronary angioplasty 01/06/2015   a. NSTEMI - Xience DES 3.5 x 33 p-mLAD; 60% D1, 50% mRCA   b. 95% ost D2 treated with suboptimal PTCA with 55% residual stenosis, 55% mid LAD, 55% mid LAD ISR treated with PTCA, 99% ost 1st septal perforator, 50% mid RCA  . Depression   . Hemorrhoid   . History of kidney stones   . NSTEMI (non-ST elevated myocardial infarction) (HCC) 12/2014  . Peripheral vascular disease [facility])     Past Surgical History:  Procedure Laterality Date  . ABDOMINAL AORTAGRAM N/A 12/18/2012   Procedure: ABDOMINAL AORTAGRAM;  Surgeon: Fransisco Hertz, MD;  [facility];  Service: Cardiovascular;  Laterality: N/A;  . AORTA - BILATERAL FEMORAL ARTERY BYPASS GRAFT N/A 01/07/2013   Procedure: AORTA BIFEMORAL BYPASS GRAFT;  Surgeon: Fransisco Hertz, MD;  Location: [facility] OR;  Service: Vascular;  Laterality: N/A;  . CARDIAC CATHETERIZATION N/A 01/06/2015   Procedure: Left Heart Cath and Coronary Angiography;  Surgeon: Kathleene Hazel, MD;  Location: [facility] INVASIVE CV LAB;  Service: Cardiovascular;  Laterality: N/A;  . CARDIAC CATHETERIZATION N/A 01/06/2015   Procedure: Coronary Stent Intervention;  Surgeon: Kathleene Hazel, MD;  Location: [facility] INVASIVE CV LAB;  Service: Cardiovascular: Xience DES 3.5 mm x 33 mm p-mLAD  . CARDIAC CATHETERIZATION N/A 07/02/2015   Procedure: Left Heart Cath and Coronary Angiography;  Surgeon: Marykay Lex, MD;  Location: [facility] INVASIVE CV LAB;  Service: Cardiovascular;  Laterality: N/A;  . CARDIAC CATHETERIZATION N/A  07/02/2015   Procedure: Coronary Stent Intervention;  Surgeon: Marykay Lex, MD;  Location: [facility] INVASIVE CV LAB;  Service: Cardiovascular;  Laterality: N/A;  . ELBOW SURGERY Bilateral   . KIDNEY STONE SURGERY     x2     Medications: Current Outpatient Prescriptions  Medication Sig Dispense Refill  . aspirin 81 MG chewable tablet Chew 1 tablet (81 mg total) by mouth daily.    [name] atorvastatin (LIPITOR) 80 MG tablet Take 1 tablet (80 mg total) by mouth daily at 6 PM. 30 tablet 5  . ibuprofen (ADVIL,MOTRIN) 200 MG tablet Take 400-600 mg by mouth every 6 (six) hours as needed for headache (pain).    . metoprolol tartrate (LOPRESSOR) 25 MG tablet Take 0.5 tablets (12.5 mg total) by mouth 2 (two) times daily. 60 tablet 5  . nitroGLYCERIN (NITROSTAT) 0.4 MG SL tablet Place 1 tablet (0.4 mg total) under the tongue every 5 (five) minutes as needed for chest pain (x 3 doses). 25 tablet 2  . omeprazole (PRILOSEC) 20 MG capsule Take 1 capsule (20 mg total) by mouth daily. 30 capsule 11  . sertraline (ZOLOFT) 100 MG tablet Take 1 tablet (100 mg total) by mouth daily. 30 tablet 0  . ticagrelor (BRILINTA) 90 MG TABS tablet Take 1 tablet (90 mg total) by mouth 2 (two) times daily. 60 tablet 11  . varenicline (CHANTIX CONTINUING MONTH PAK) 1 MG tablet Take 1 tablet (  1 mg total) by mouth 2 (two) times daily. 60 tablet 1   No current facility-administered medications for this visit.     Allergies: Allergies  Allergen Reactions  . Simvastatin Nausea Only    Stomach aches    Social History: The patient  reports that he has been smoking Cigarettes and E-cigarettes.  He has a 30.00 pack-year smoking history. He has never used smokeless tobacco. He reports that he does not drink alcohol or use drugs.   Family History: The patient's ***family history includes Cancer in his paternal grandfather; Heart attack in his father and mother; Heart disease in his father, maternal grandmother, and mother;  Hyperlipidemia in his father and mother.   Review of Systems: Please see the history of present illness.   Otherwise, the review of systems is positive for {NONE DEFAULTED:18576::"none"}.   All other systems are reviewed and negative.   Physical Exam: VS:  There were no vitals taken for this visit. [name].  BMI There is no height or weight on file to calculate BMI.  Wt Readings from Last 3 Encounters:  07/11/15 168 lb (76.2 kg)  07/11/15 168 lb 8 oz (76.4 kg)  07/04/15 168 lb (76.2 kg)    General: Pleasant. Well developed, well nourished and in no acute distress.   HEENT: Normal.  Neck: Supple, no JVD, carotid bruits, or masses noted.  Cardiac: ***Regular rate and rhythm. No murmurs, rubs, or gallops. No edema.  Respiratory:  Lungs are clear to auscultation bilaterally with normal work of breathing.  GI: Soft and nontender.  MS: No deformity or atrophy. Gait and ROM intact.  Skin: Warm and dry. Color is normal.  Neuro:  Strength and sensation are intact and no gross focal deficits noted.  Psych: Alert, appropriate and with normal affect.   LABORATORY DATA:  EKG:  EKG {ACTION; IS/IS [id number]}OT:[id number]} ordered today. This demonstrates ***.  Lab Results  Component Value Date   WBC 7.5 07/11/2015   HGB 14.9 07/11/2015   HCT 44.0 07/11/2015   PLT 237 07/11/2015   GLUCOSE 81 07/11/2015   CHOL 116 07/03/2015   TRIG 72 07/03/2015   HDL 34 (L) 07/03/2015   LDLCALC 68 07/03/2015   ALT 23 07/03/2015   AST 35 07/03/2015   NA 139 07/11/2015   K 4.3 07/11/2015   CL 107 07/11/2015   CREATININE 1.05 07/11/2015   BUN 14 07/11/2015   CO2 24 07/11/2015   TSH 1.530 07/03/2015   INR 1.08 07/03/2015   INR 1.08 07/03/2015   HGBA1C 5.7 (H) 07/03/2015    BNP (last 3 results) No results for input(s): BNP in the last 8760 hours.  ProBNP (last 3 results) No results for input(s): PROBNP in the last 8760 hours.   Other Studies Reviewed Today:   Assessment/Plan:   Current medicines are  reviewed with the patient today.  The patient does not have concerns regarding medicines other than what has been noted above.  The following changes have been made:  See above.  Labs/ tests ordered today include:   No orders of the defined types were placed in this encounter.    Disposition:   FU with *** in {gen number [id number]}0-10:310397} {Days to years:10300}.   Patient is agreeable to this plan and will call if any problems develop in the interim.   Signed: Rosalio MacadamiaLori C. Jestin Burbach, RN, ANP-C 09/30/2015 7:32 AM  [facility] [address] [place], [place]  [address] Phone: [phone] Fax: [phone]  [phone]        

## 2015-10-06 ENCOUNTER — Encounter: Payer: Self-pay | Admitting: Nurse Practitioner

## 2015-10-16 NOTE — Progress Notes (Deleted)
Cardiology Office Note   Date:  10/16/2015   ID:  Tommy CapersLarry W Gaddie Jr., DOB 1965/03/20, MRN [id number]  PCP:  Katy ApoPOLITE,RONALD D, MD  Cardiologist:   Dietrich PatesPaula Stormy Sabol, MD   No chief complaint on file.     History of Present Illness: Tommy CapersLarry W Blasko Jr. is a 50 y.o. male with a history of Tob use, HL, PAD 9s/p aortobfem bypass, CV dz.   Normal myoview in 2014   Underwent cath in December 2016 after NSTEMI in 12/2014 and Cardiac catheterization showed 99% proximal LAD lesion treated with DES (Xience 3.5 x 33 mm DES), 60% D2 stenosis, 70% proximal to mid LAD lesion covered with same stent, 50% mid RCA lesion, 30% distal RCA lesion, EF 45-50%. Postprocedure, he was enrolled in [place] trial and placed on aspirin and Brilinta. Echocardiogram was obtained on 12/9 which showed EF 55-60% Seen in June 2016 by [employer] Gerhardt   Insurance has been an issue         Outpatient Medications Prior to Visit  Medication Sig Dispense Refill  . aspirin 81 MG chewable tablet Chew 1 tablet (81 mg total) by mouth daily.    [name]. atorvastatin (LIPITOR) 80 MG tablet Take 1 tablet (80 mg total) by mouth daily at 6 PM. 30 tablet 5  . ibuprofen (ADVIL,MOTRIN) 200 MG tablet Take 400-600 mg by mouth every 6 (six) hours as needed for headache (pain).    . metoprolol tartrate (LOPRESSOR) 25 MG tablet Take 0.5 tablets (12.5 mg total) by mouth 2 (two) times daily. 60 tablet 5  . nitroGLYCERIN (NITROSTAT) 0.4 MG SL tablet Place 1 tablet (0.4 mg total) under the tongue every 5 (five) minutes as needed for chest pain (x 3 doses). 25 tablet 2  . omeprazole (PRILOSEC) 20 MG capsule Take 1 capsule (20 mg total) by mouth daily. 30 capsule 11  . sertraline (ZOLOFT) 100 MG tablet Take 1 tablet (100 mg total) by mouth daily. 30 tablet 0  . ticagrelor (BRILINTA) 90 MG TABS tablet Take 1 tablet (90 mg total) by mouth 2 (two) times daily. 60 tablet 11  . varenicline (CHANTIX CONTINUING MONTH PAK) 1 MG tablet Take 1 tablet (1 mg total) by mouth 2  (two) times daily. 60 tablet 1   No facility-administered medications prior to visit.      Allergies:   Simvastatin   Past Medical History:  Diagnosis Date  . Acid reflux   . Anxiety   . CAD S/P percutaneous coronary angioplasty 01/06/2015   a. NSTEMI - Xience DES 3.5 x 33 p-mLAD; 60% D1, 50% mRCA   b. 95% ost D2 treated with suboptimal PTCA with 55% residual stenosis, 55% mid LAD, 55% mid LAD ISR treated with PTCA, 99% ost 1st septal perforator, 50% mid RCA  . Depression   . Hemorrhoid   . History of kidney stones   . NSTEMI (non-ST elevated myocardial infarction) (HCC) 12/2014  . Peripheral vascular disease [facility](HCC)     Past Surgical History:  Procedure Laterality Date  . ABDOMINAL AORTAGRAM N/A 12/18/2012   Procedure: ABDOMINAL AORTAGRAM;  Surgeon: Fransisco HertzBrian L Chen, MD;  [facility];  Service: Cardiovascular;  Laterality: N/A;  . AORTA - BILATERAL FEMORAL ARTERY BYPASS GRAFT N/A 01/07/2013   Procedure: AORTA BIFEMORAL BYPASS GRAFT;  Surgeon: Fransisco HertzBrian L Chen, MD;  [facility];  Service: Vascular;  Laterality: N/A;  . CARDIAC CATHETERIZATION N/A 01/06/2015   Procedure: Left Heart Cath and Coronary Angiography;  Surgeon: Kathleene Hazelhristopher D McAlhany, MD;  Location: [facility] INVASIVE CV LAB;  Service: Cardiovascular;  Laterality: N/A;  . CARDIAC CATHETERIZATION N/A 01/06/2015   Procedure: Coronary Stent Intervention;  Surgeon: Kathleene Hazel, MD;  Location: [facility] INVASIVE CV LAB;  Service: Cardiovascular: Xience DES 3.5 mm x 33 mm p-mLAD  . CARDIAC CATHETERIZATION N/A 07/02/2015   Procedure: Left Heart Cath and Coronary Angiography;  Surgeon: Marykay Lex, MD;  Location: [facility] INVASIVE CV LAB;  Service: Cardiovascular;  Laterality: N/A;  . CARDIAC CATHETERIZATION N/A 07/02/2015   Procedure: Coronary Stent Intervention;  Surgeon: Marykay Lex, MD;  Location: [facility] INVASIVE CV LAB;  Service: Cardiovascular;  Laterality: N/A;  . ELBOW SURGERY Bilateral   . KIDNEY STONE SURGERY     x2      Social History:  The patient  reports that he has been smoking Cigarettes and E-cigarettes.  He has a 30.00 pack-year smoking history. He has never used smokeless tobacco. He reports that he does not drink alcohol or use drugs.   Family History:  The patient's family history includes Cancer in his paternal grandfather; Heart attack in his father and mother; Heart disease in his father, maternal grandmother, and mother; Hyperlipidemia in his father and mother.    ROS:  Please see the history of present illness. All other systems are reviewed and  Negative to the above problem except as noted.    PHYSICAL EXAM: VS:  There were no vitals taken for this visit.  GEN: Well nourished, well developed, in no acute distress  HEENT: normal  Neck: no JVD, carotid bruits, or masses Cardiac: RRR; no murmurs, rubs, or gallops,no edema  Respiratory:  clear to auscultation bilaterally, normal work of breathing GI: soft, nontender, nondistended, + BS  No hepatomegaly  MS: no deformity Moving all extremities   Skin: warm and dry, no rash Neuro:  Strength and sensation are intact Psych: euthymic mood, full affect   EKG:  EKG is ordered today.   Lipid Panel    Component Value Date/Time   CHOL 116 07/03/2015 0530   TRIG 72 07/03/2015 0530   HDL 34 (L) 07/03/2015 0530   CHOLHDL 3.4 07/03/2015 0530   VLDL 14 07/03/2015 0530   LDLCALC 68 07/03/2015 0530      Wt Readings from Last 3 Encounters:  07/11/15 168 lb (76.2 kg)  07/11/15 168 lb 8 oz (76.4 kg)  07/04/15 168 lb (76.2 kg)      ASSESSMENT AND PLAN:       Labs/ tests ordered today include: No orders of the defined types were placed in this encounter.    Disposition:   FU with  in   Signed, Dietrich Pates, MD  10/16/2015 10:01 AM    [facility] [address] [place], [place], [place]  [address] Phone: [phone]; Fax: [phone]

## 2015-10-17 ENCOUNTER — Ambulatory Visit: Payer: Self-pay | Admitting: Internal Medicine

## 2015-10-19 ENCOUNTER — Telehealth: Payer: Self-pay | Admitting: *Deleted

## 2015-10-19 NOTE — Telephone Encounter (Signed)
Left message for patient to call research office for month 9 telephone visit. He will remain in telephone follow up even though he is on open label ASA.

## 2015-10-24 ENCOUNTER — Encounter: Payer: Self-pay | Admitting: *Deleted

## 2015-10-24 DIAGNOSIS — Z006 Encounter for examination for normal comparison and control in clinical research program: Secondary | ICD-10-CM

## 2015-10-24 NOTE — Progress Notes (Signed)
TWILIGHT Research study 9 month telephone call completed. Even though patient is on open label ASA and not in treatment for research study (due to Revascularization) he remains in follow up. Patient states he is taking his Brilinta and ASA. He denies any bleeding or other adverse events. Questions encouraged and answered. Only 2 more telephone follow up required for research study and patient agrees to complete.

## 2015-10-27 ENCOUNTER — Telehealth: Payer: Self-pay | Admitting: Vascular Surgery

## 2015-10-27 NOTE — Telephone Encounter (Signed)
Per the note from Suzanne,NP and Dr.Ross, I scheduled an appt for this patient and called and left voice message for him to call back for appt information. I did schedule abi's/carotid US and an appt w/ [facility] on 12/02/15 at 1pm. awt

## 2015-10-27 NOTE — Telephone Encounter (Signed)
-----   Message from Annye RuskSuzanne L Nickel, NP sent at 10/26/2015  1:04 PM EDT ----- Regarding: apparently needs follow up He is not scheduled to return, Dr. Tenny Crawoss note is suggesting he return here for follow up. Thank you, Rosalita ChessmanSuzanne  ----- Message ----- From: Pricilla RifflePaula V Ross, MD Sent: 04/12/2015   8:09 PM To: Carma LairSuzanne L Nickel, NP, Lendon KaMichalene Wilson, RN  Pt has been followed at [facility].  Not seen in couple years. I will forward for review and follow up

## 2015-11-01 ENCOUNTER — Other Ambulatory Visit: Payer: Self-pay | Admitting: Vascular Surgery

## 2015-11-01 DIAGNOSIS — I70219 Atherosclerosis of native arteries of extremities with intermittent claudication, unspecified extremity: Secondary | ICD-10-CM

## 2015-11-01 DIAGNOSIS — I779 Disorder of arteries and arterioles, unspecified: Secondary | ICD-10-CM

## 2015-11-01 DIAGNOSIS — I70209 Unspecified atherosclerosis of native arteries of extremities, unspecified extremity: Secondary | ICD-10-CM

## 2015-11-01 DIAGNOSIS — I739 Peripheral vascular disease, unspecified: Secondary | ICD-10-CM

## 2015-11-10 DIAGNOSIS — Z23 Encounter for immunization: Secondary | ICD-10-CM | POA: Diagnosis not present

## 2015-11-30 ENCOUNTER — Encounter: Payer: Self-pay | Admitting: Vascular Surgery

## 2015-12-02 ENCOUNTER — Encounter (HOSPITAL_COMMUNITY): Payer: Self-pay

## 2015-12-02 ENCOUNTER — Ambulatory Visit: Payer: Self-pay | Admitting: Vascular Surgery

## 2016-02-14 ENCOUNTER — Encounter: Payer: Self-pay | Admitting: Vascular Surgery

## 2016-02-17 ENCOUNTER — Encounter (HOSPITAL_COMMUNITY): Payer: Self-pay

## 2016-02-17 ENCOUNTER — Ambulatory Visit: Payer: Self-pay | Admitting: Vascular Surgery

## 2016-02-17 ENCOUNTER — Other Ambulatory Visit: Payer: Self-pay | Admitting: Cardiology

## 2016-02-23 DIAGNOSIS — I2510 Atherosclerotic heart disease of native coronary artery without angina pectoris: Secondary | ICD-10-CM | POA: Diagnosis not present

## 2016-02-23 DIAGNOSIS — I739 Peripheral vascular disease, unspecified: Secondary | ICD-10-CM | POA: Diagnosis not present

## 2016-02-23 DIAGNOSIS — G458 Other transient cerebral ischemic attacks and related syndromes: Secondary | ICD-10-CM | POA: Diagnosis not present

## 2016-02-23 DIAGNOSIS — E7800 Pure hypercholesterolemia, unspecified: Secondary | ICD-10-CM | POA: Diagnosis not present

## 2016-03-22 ENCOUNTER — Telehealth: Payer: Self-pay | Admitting: Vascular Surgery

## 2016-03-22 NOTE — Telephone Encounter (Signed)
Unable to get in touch with patient to notify him of his appointment. The number on file doesn't accept voicemail. I also attempted to contact his emergency contacts and was unsuccessful. As a last attempt, I will email the patient via MyChart to see if the patient is still interested to see us for a follow up appointment.

## 2016-04-06 ENCOUNTER — Encounter (HOSPITAL_COMMUNITY): Payer: Self-pay

## 2016-04-06 ENCOUNTER — Ambulatory Visit: Payer: Self-pay | Admitting: Vascular Surgery

## 2016-04-10 ENCOUNTER — Telehealth (HOSPITAL_COMMUNITY): Payer: Self-pay | Admitting: Nurse Practitioner

## 2016-04-10 NOTE — Telephone Encounter (Signed)
Follow -up call attempted for the TWILIGHT study 18 month telephone call. Unable to reach patient but message was left on his voice mail with instructions left to call back at when possible.

## 2016-05-22 ENCOUNTER — Ambulatory Visit (INDEPENDENT_AMBULATORY_CARE_PROVIDER_SITE_OTHER): Payer: BLUE CROSS/BLUE SHIELD | Admitting: Nurse Practitioner

## 2016-05-22 ENCOUNTER — Encounter: Payer: Self-pay | Admitting: Nurse Practitioner

## 2016-05-22 VITALS — BP 130/90 | HR 57 | Ht 69.0 in | Wt 171.1 lb

## 2016-05-22 DIAGNOSIS — I259 Chronic ischemic heart disease, unspecified: Secondary | ICD-10-CM | POA: Diagnosis not present

## 2016-05-22 DIAGNOSIS — E7800 Pure hypercholesterolemia, unspecified: Secondary | ICD-10-CM | POA: Diagnosis not present

## 2016-05-22 DIAGNOSIS — Z720 Tobacco use: Secondary | ICD-10-CM

## 2016-05-22 NOTE — Progress Notes (Signed)
CARDIOLOGY OFFICE NOTE  Date:  05/22/2016    Tommy Malone. Date of Birth: Jan 21, 1966 Medical Record #[id number]  PCP:  Katy Apo, MD  Cardiologist:  Tenny Craw   Chief Complaint  Patient presents with  . Coronary Artery Disease    10 month check - seen for Dr. Tenny Craw    History of Present Illness: Tommy Malone. is a 51 y.o. male who presents today for a 10 month check. Seen for Dr. Tenny Craw.    He has a history of tobacco abuse ongoing, untreated hyperlipidemia, lower extremity PAD s/p aortobifemoral bypass graft by Dr. Imogene Burn in 12/2012, and bilateral carotid artery disease . Myoview stress test in 2014 was normal without ischemia, LVEF was normal 57%. He also has bilateral carotid artery disease with evidence of left subclavian stenosis. Carotid Doppler performed in January 2015 showed 40% right ICA stenosis and 40-59% left ICA stenosis. There was no documentation of the degree of his left subclavian stenosis.   He presented with CP and NSTEMI in 12/2014 and Cardiac catheterizationshowed 99% proximal LAD lesion treated with DES (Xience 3.5 x 33 mm DES), 60% D2 stenosis, 70% proximal to mid LAD lesion covered with same stent, 50% mid RCA lesion, 30% distal RCA lesion, EF 45-50%. Postprocedure, he was enrolled in [place] trial and placed on aspirin and Brilinta. Echocardiogram was obtained on 12/9 which showed EF 55-60%.   In June of 2017 he had sudden onset of SSCP with no radiation but associated with severe diaphoresis and SOB. Pain was identical to his prior MI. He took a SL NTG without any improvement and called EMS. He was given ASA and Heparin bolus 5000 units. EKG showed ST elevated in V1 and V2, aVL and I with reciprocal ST depression in the inferior leads and code STEMI was called.    Patient underwent cardiac catheterization on 07/03/2015 95% ost D2 treated with suboptimal PTCA with 55% residual stenosis, 55% mid LAD, 55% mid LAD ISR treated with PTCA, 99% ost 1st  septal perforator, 50% mid RCA. Echocardiogram obtained on 07/04/2015 showed EF 60-65%. Unfortunately during the cardiac catheterization septal perforator was jailed, post cath, his troponin trended up to 8.32. He did well clinically.      I then saw him for his post hospital visit - has not been back here since - needed to go back to work for money. Still smoking. No chest pain. His anginal equivalent is very GI in nature.    Comes in today. Here alone. Doing ok. No chest pain other than some fleeting pains - nothing like his prior chest pain syndrome. Not short of breath. He has had some swelling in his legs a week or so ago - not clear why - it has resolved - may have been related to NSAID use. He had no other associated symptoms and it is now resolved. He has gotten insurance - he was able to get Chantix - he has stopped smoking for the past month. Overall, he feels pretty well. Tolerating his medicines. Brilinta is affordable at this time.   Past Medical History:  Diagnosis Date  . Acid reflux   . Anxiety   . CAD S/P percutaneous coronary angioplasty 01/06/2015   a. NSTEMI - Xience DES 3.5 x 33 p-mLAD; 60% D1, 50% mRCA   b. 95% ost D2 treated with suboptimal PTCA with 55% residual stenosis, 55% mid LAD, 55% mid LAD ISR treated with PTCA, 99% ost 1st septal perforator, 50% mid  RCA  . Depression   . Hemorrhoid   . History of kidney stones   . NSTEMI (non-ST elevated myocardial infarction) (HCC) 12/2014  . Peripheral vascular disease [facility])     Past Surgical History:  Procedure Laterality Date  . ABDOMINAL AORTAGRAM N/A 12/18/2012   Procedure: ABDOMINAL AORTAGRAM;  Surgeon: Fransisco Hertz, MD;  Location: [facility] CATH LAB;  Service: Cardiovascular;  Laterality: N/A;  . AORTA - BILATERAL FEMORAL ARTERY BYPASS GRAFT N/A 01/07/2013   Procedure: AORTA BIFEMORAL BYPASS GRAFT;  Surgeon: Fransisco Hertz, MD;  Location: [facility] OR;  Service: Vascular;  Laterality: N/A;  . CARDIAC CATHETERIZATION N/A 01/06/2015    Procedure: Left Heart Cath and Coronary Angiography;  Surgeon: Kathleene Hazel, MD;  Location: [facility] INVASIVE CV LAB;  Service: Cardiovascular;  Laterality: N/A;  . CARDIAC CATHETERIZATION N/A 01/06/2015   Procedure: Coronary Stent Intervention;  Surgeon: Kathleene Hazel, MD;  Location: [facility] INVASIVE CV LAB;  Service: Cardiovascular: Xience DES 3.5 mm x 33 mm p-mLAD  . CARDIAC CATHETERIZATION N/A 07/02/2015   Procedure: Left Heart Cath and Coronary Angiography;  Surgeon: Marykay Lex, MD;  Location: [facility] INVASIVE CV LAB;  Service: Cardiovascular;  Laterality: N/A;  . CARDIAC CATHETERIZATION N/A 07/02/2015   Procedure: Coronary Stent Intervention;  Surgeon: Marykay Lex, MD;  Location: [facility] INVASIVE CV LAB;  Service: Cardiovascular;  Laterality: N/A;  . ELBOW SURGERY Bilateral   . KIDNEY STONE SURGERY     x2     Medications: Current Outpatient Prescriptions  Medication Sig Dispense Refill  . aspirin 81 MG chewable tablet Chew 1 tablet (81 mg total) by mouth daily.    [name] atorvastatin (LIPITOR) 80 MG tablet Take 1 tablet (80 mg total) by mouth daily at 6 PM. 30 tablet 5  . ibuprofen (ADVIL,MOTRIN) 200 MG tablet Take 400-600 mg by mouth every 6 (six) hours as needed for headache (pain).    . metoprolol tartrate (LOPRESSOR) 25 MG tablet TAKE 1/2 TABLET BY MOUTH TWICE A DAY 60 tablet 1  . nitroGLYCERIN (NITROSTAT) 0.4 MG SL tablet Place 1 tablet (0.4 mg total) under the tongue every 5 (five) minutes as needed for chest pain (x 3 doses). 25 tablet 2  . omeprazole (PRILOSEC) 20 MG capsule Take 1 capsule (20 mg total) by mouth daily. 30 capsule 11  . sertraline (ZOLOFT) 100 MG tablet Take 1 tablet (100 mg total) by mouth daily. 30 tablet 0  . ticagrelor (BRILINTA) 90 MG TABS tablet Take 1 tablet (90 mg total) by mouth 2 (two) times daily. 60 tablet 11  . varenicline (CHANTIX CONTINUING MONTH PAK) 1 MG tablet Take 1 tablet (1 mg total) by mouth 2 (two) times daily. 60 tablet 1   No current  facility-administered medications for this visit.     Allergies: Allergies  Allergen Reactions  . Simvastatin Nausea Only    Stomach aches    Social History: The patient  reports that he quit smoking about 4 weeks ago. His smoking use included Cigarettes and E-cigarettes. He has a 30.00 pack-year smoking history. He has never used smokeless tobacco. He reports that he does not drink alcohol or use drugs.   Family History: The patient's family history includes Cancer in his paternal grandfather; Heart attack in his father and mother; Heart disease in his father, maternal grandmother, and mother; Hyperlipidemia in his father and mother.   Review of Systems: Please see the history of present illness.   Otherwise, the review of systems is positive for  none.   All other systems are reviewed and negative.   Physical Exam: VS:  BP 130/90 (BP Location: Left Arm, Patient Position: Sitting, Cuff Size: Normal)   Pulse (!) 57   Ht  (1.753 m)   Wt 171 lb 1.9 oz (77.6 kg)   SpO2 96% Comment: at rest  BMI 25.27 kg/m  .  BMI Body mass index is 25.27 kg/m.  Wt Readings from Last 3 Encounters:  05/22/16 171 lb 1.9 oz (77.6 kg)  07/11/15 168 lb (76.2 kg)  07/11/15 168 lb 8 oz (76.4 kg)    General: Pleasant. Well developed, well nourished and in no acute distress.   HEENT: Normal.  Neck: Supple, no JVD, carotid bruits, or masses noted.  Cardiac: Regular rate and rhythm. No murmurs, rubs, or gallops. No edema.  Respiratory:  Lungs are clear to auscultation bilaterally with normal work of breathing.  GI: Soft and nontender.  MS: No deformity or atrophy. Gait and ROM intact.  Skin: Warm and dry. Color is normal.  Neuro:  Strength and sensation are intact and no gross focal deficits noted.  Psych: Alert, appropriate and with normal affect.   LABORATORY DATA:  EKG:  EKG is not ordered today.  Lab Results  Component Value Date   WBC 7.5 07/11/2015   HGB 14.9 07/11/2015   HCT 44.0  07/11/2015   PLT 237 07/11/2015   GLUCOSE 81 07/11/2015   CHOL 116 07/03/2015   TRIG 72 07/03/2015   HDL 34 (L) 07/03/2015   LDLCALC 68 07/03/2015   ALT 23 07/03/2015   AST 35 07/03/2015   NA 139 07/11/2015   K 4.3 07/11/2015   CL 107 07/11/2015   CREATININE 1.05 07/11/2015   BUN 14 07/11/2015   CO2 24 07/11/2015   TSH 1.530 07/03/2015   INR 1.08 07/03/2015   INR 1.08 07/03/2015   HGBA1C 5.7 (H) 07/03/2015    BNP (last 3 results) No results for input(s): BNP in the last 8760 hours.  ProBNP (last 3 results) No results for input(s): PROBNP in the last 8760 hours.   Other Studies Reviewed Today:  Cath 07/03/2015 Conclusion    1. Ost 2nd Diag to 2nd Diag lesion, 95% stenosed. Post PTCA intervention, there is a 55% residual stenosis. 2. Mid LAD lesion, 55% stenosed. Involved in the bifurcation lesion. This is in-stent restenosis. Post intervention - PTCA only, there is a 5% residual stenosis. 3. Ost 1st Sept lesion, 99% stenosed. 4. Mid RCA lesion, 50% stenosed. Dist RCA lesion, 30% stenosed. 5. Prox Cx to Mid Cx lesion, 25% stenosed. 6. Elevated LVEDP  For his lateral ST elevations, likely culprit lesion is the jailed D2 vessel with a 95% stenosis.  Ultimately the PTCA result on D2 was suboptimal, but after 2 angioplasty balloons ruptured at this lesion, I felt it safest to abort any further attempts. There was also given plaque shift into the stented segment of the LAD. This was treated with post-dilation balloon PTCA. Unfortunately in part of this process, the jailed septal perforator trunk with a 99% stenosis that had TIMI 2 flow initially and TIMI 1 flow at the end of the case. The patient was still having 5 out of 10 anginal pain as well as GERD symptoms at the end of the case. He was given 4 mg of morphine, 20 mg Pepcid IV, and the nitroglycerin glycerin infusion was increased. Upon arrival to the CCU room, the patient was relatively pain-free and feeling better. I  suspect his  pain was coming from the jailed septal perforator trunk that had TIMI 1 flow at the end of the case, likely related to increased LVEDP from systemic hypertension leading to high wall stress.   Plan:  Admit to CCU for post PTCA/PCI care.  TR band removal per protocol  Aggrastat infusion for 6 hours  Consider IV heparin for 24 hours, starting 8 hours after sheath removal  Nitrogen infusion for afterload reduction  1 dose of IV Lasix to be administered upon arrival CCU. - 20 mg  When necessary morphine for pain control  Based on this presentation, I would prefer not to fast track, however if he looks stable by the next day, to consider. He is already on Brilinta, but would need care management assistance to see if we can figure out a way to get his medications on discharge. Apparently has not been taking a lot of his medications besides Brilinta.  He needs to stop Smoking.        Echo Study Conclusions 06/2015  - Left ventricle: The cavity size was normal. Systolic function was  normal. The estimated ejection fraction was in the range of 60%  to 65%. Wall motion was normal; there were no regional wall  motion abnormalities. Left ventricular diastolic function  parameters were normal. - Aortic valve: There was no regurgitation. - Mitral valve: Structurally normal valve. - Right ventricle: The cavity size was normal. Wall thickness was  normal. Systolic function was normal. - Right atrium: The atrium was normal in size. - Tricuspid valve: There was trivial regurgitation. - Pulmonary arteries: Systolic pressure was within the normal  range. - Inferior vena cava: The vessel was normal in size. - Pericardium, extracardiac: There was no pericardial effusion.  Impressions:  - No regional wall motion abnormalities were seen on Definity  images.  Assessment/Plan: 1. CAD with recurrent angina - s/p cardiac cath 06/2015 with angioplasty to 1st diagonal -  has had "jailed" septal perforator - angioplasty result not ideal. Currently without symptoms. He is doing ok. Continue Brilinta and aspirin along with CV risk factor modification. He has stopped smoking.   2. HLD - on statin - labs today  3. Tobacco abuse - stopped with Chantix.   4. GERD - not really discussed today.   5. PVD - no claudication  6. Reports of leg swelling - resolved- may have been related to NSAID use. He will let me know if this recurs or if he has any associated symptoms.   Current medicines are reviewed with the patient today.  The patient does not have concerns regarding medicines other than what has been noted above.  The following changes have been made:  See above.  Labs/ tests ordered today include:    Orders Placed This Encounter  Procedures  . Basic metabolic panel  . CBC  . Hepatic function panel  . Lipid panel     Disposition:   FU with me in 6 months.   Patient is agreeable to this plan and will call if any problems develop in the interim.   SignedNorma Fredrickson, NP  05/22/2016 8:55 AM  [facility] [address] [place], [place]  [address] Phone: [phone] Fax: [phone]

## 2016-05-22 NOTE — Patient Instructions (Addendum)
We will be checking the following labs today - BMET, CBC, HPF and Lipids   Medication Instructions:    Continue with your current medicines.     Testing/Procedures To Be Arranged:  N/A  Follow-Up:   See me in 6 months with fasting labs and EKG    Other Special Instructions:   Congrats for stopping smoking!!!    If you need a refill on your cardiac medications before your next appointment, please call your pharmacy.   Call the [facility] office at [phone] if you have any questions, problems or concerns.

## 2016-05-24 LAB — LIPID PANEL
Chol/HDL Ratio: 4.2 ratio (ref 0.0–5.0)
Cholesterol, Total: 165 mg/dL (ref 100–199)
HDL: 39 mg/dL — ABNORMAL LOW (ref >39)
LDL Calculated: 100 mg/dL — ABNORMAL HIGH (ref 0–99)
Triglycerides: 128 mg/dL (ref 0–149)
VLDL Cholesterol Cal: 26 mg/dL (ref 5–40)

## 2016-05-24 LAB — BASIC METABOLIC PANEL
BUN/Creatinine Ratio: 17 (ref 9–20)
BUN: 20 mg/dL (ref 6–24)
CO2: 23 mmol/L (ref 18–29)
Calcium: 8.7 mg/dL (ref 8.7–10.2)
Chloride: 101 mmol/L (ref 96–106)
Creatinine, Ser: 1.16 mg/dL (ref 0.76–1.27)
GFR calc Af Amer: 85 mL/min/{1.73_m2} (ref >59)
GFR calc non Af Amer: 74 mL/min/{1.73_m2} (ref >59)
Glucose: 84 mg/dL (ref 65–99)
Potassium: 4.5 mmol/L (ref 3.5–5.2)
Sodium: 140 mmol/L (ref 134–144)

## 2016-05-24 LAB — CBC
Hematocrit: 46.3 % (ref 37.5–51.0)
Hemoglobin: 15.3 g/dL (ref 13.0–17.7)
MCH: 29.6 pg (ref 26.6–33.0)
MCHC: 33 g/dL (ref 31.5–35.7)
MCV: 90 fL (ref 79–97)
Platelets: 210 10*3/uL (ref 150–379)
RBC: 5.17 x10E6/uL (ref 4.14–5.80)
RDW: 13.5 % (ref 12.3–15.4)
WBC: 12.6 10*3/uL — ABNORMAL HIGH (ref 3.4–10.8)

## 2016-05-24 LAB — HEPATIC FUNCTION PANEL
ALT: 18 IU/L (ref 0–44)
AST: 17 IU/L (ref 0–40)
Albumin: 3.6 g/dL (ref 3.5–5.5)
Alkaline Phosphatase: 59 IU/L (ref 39–117)
Bilirubin Total: 0.2 mg/dL (ref 0.0–1.2)
Bilirubin, Direct: 0.07 mg/dL (ref 0.00–0.40)
Total Protein: 5.4 g/dL — ABNORMAL LOW (ref 6.0–8.5)

## 2016-07-09 ENCOUNTER — Telehealth: Payer: Self-pay | Admitting: *Deleted

## 2016-07-09 NOTE — Telephone Encounter (Signed)
Left message for patient to call research office for the TWILIGHT 18 month (end of study) phone call.

## 2016-07-25 ENCOUNTER — Other Ambulatory Visit: Payer: Self-pay | Admitting: Physician Assistant

## 2016-07-25 NOTE — Telephone Encounter (Signed)
Refill Request.  

## 2016-07-31 ENCOUNTER — Telehealth: Payer: Self-pay | Admitting: *Deleted

## 2016-07-31 NOTE — Telephone Encounter (Signed)
Left message for patient to call research office for last TWILIGHT telephone follow up. I will mail letter to try to get a response from patient.

## 2016-08-09 ENCOUNTER — Other Ambulatory Visit: Payer: Self-pay | Admitting: Physician Assistant

## 2016-08-09 NOTE — Telephone Encounter (Signed)
Please review for refill, thanks ! 

## 2016-08-09 NOTE — Telephone Encounter (Signed)
Medication Detail    Disp Refills Start End   BRILINTA 90 MG TABS tablet 60 tablet 8 07/25/2016    Sig - Route: TAKE 1 TABLET (90 MG TOTAL) BY MOUTH 2 (TWO) TIMES DAILY. - Oral   Sent to pharmacy as: BRILINTA 90 MG Tab tablet   E-Prescribing Status: Receipt confirmed by pharmacy (07/25/2016 11:05 AM EDT)   Pharmacy   [id number] - [place], [place] - [address]

## 2016-10-22 ENCOUNTER — Telehealth: Payer: Self-pay | Admitting: *Deleted

## 2016-10-22 NOTE — Telephone Encounter (Signed)
[facility] being on blood thinner. Will fax to [phone] Lori's recommendation's.  Phone # is [phone].

## 2016-11-26 ENCOUNTER — Ambulatory Visit (INDEPENDENT_AMBULATORY_CARE_PROVIDER_SITE_OTHER): Payer: BLUE CROSS/BLUE SHIELD | Admitting: Nurse Practitioner

## 2016-11-26 ENCOUNTER — Encounter: Payer: Self-pay | Admitting: Nurse Practitioner

## 2016-11-26 VITALS — BP 118/80 | HR 53 | Ht 69.0 in | Wt 171.8 lb

## 2016-11-26 DIAGNOSIS — Z9861 Coronary angioplasty status: Secondary | ICD-10-CM

## 2016-11-26 DIAGNOSIS — I259 Chronic ischemic heart disease, unspecified: Secondary | ICD-10-CM

## 2016-11-26 DIAGNOSIS — I2510 Atherosclerotic heart disease of native coronary artery without angina pectoris: Secondary | ICD-10-CM

## 2016-11-26 DIAGNOSIS — E7800 Pure hypercholesterolemia, unspecified: Secondary | ICD-10-CM

## 2016-11-26 LAB — BASIC METABOLIC PANEL
BUN/Creatinine Ratio: 9 (ref 9–20)
BUN: 12 mg/dL (ref 6–24)
CO2: 25 mmol/L (ref 20–29)
Calcium: 9.3 mg/dL (ref 8.7–10.2)
Chloride: 100 mmol/L (ref 96–106)
Creatinine, Ser: 1.29 mg/dL — ABNORMAL HIGH (ref 0.76–1.27)
GFR calc Af Amer: 74 mL/min/{1.73_m2} (ref >59)
GFR calc non Af Amer: 64 mL/min/{1.73_m2} (ref >59)
Glucose: 94 mg/dL (ref 65–99)
Potassium: 5 mmol/L (ref 3.5–5.2)
Sodium: 138 mmol/L (ref 134–144)

## 2016-11-26 LAB — HEPATIC FUNCTION PANEL
ALT: 17 IU/L (ref 0–44)
AST: 17 IU/L (ref 0–40)
Albumin: 4.2 g/dL (ref 3.5–5.5)
Alkaline Phosphatase: 79 IU/L (ref 39–117)
Bilirubin Total: 0.4 mg/dL (ref 0.0–1.2)
Bilirubin, Direct: 0.12 mg/dL (ref 0.00–0.40)
Total Protein: 6.1 g/dL (ref 6.0–8.5)

## 2016-11-26 LAB — CBC
Hematocrit: 46.8 % (ref 37.5–51.0)
Hemoglobin: 16.5 g/dL (ref 13.0–17.7)
MCH: 31.1 pg (ref 26.6–33.0)
MCHC: 35.3 g/dL (ref 31.5–35.7)
MCV: 88 fL (ref 79–97)
Platelets: 198 10*3/uL (ref 150–379)
RBC: 5.31 x10E6/uL (ref 4.14–5.80)
RDW: 13.6 % (ref 12.3–15.4)
WBC: 7.2 10*3/uL (ref 3.4–10.8)

## 2016-11-26 LAB — LIPID PANEL
Chol/HDL Ratio: 4.5 ratio (ref 0.0–5.0)
Cholesterol, Total: 197 mg/dL (ref 100–199)
HDL: 44 mg/dL (ref >39)
LDL Calculated: 128 mg/dL — ABNORMAL HIGH (ref 0–99)
Triglycerides: 124 mg/dL (ref 0–149)
VLDL Cholesterol Cal: 25 mg/dL (ref 5–40)

## 2016-11-26 MED ORDER — SERTRALINE HCL 100 MG PO TABS: 100.0000 mg | ORAL_TABLET | Freq: Every day | ORAL | 6 refills | Status: DC

## 2016-11-26 NOTE — Progress Notes (Signed)
CARDIOLOGY OFFICE NOTE  Date:  11/26/2016    Tommy Malone. Date of Birth: [birth year] Medical Record #[id number]  PCP:  Renford Dills, MD  Cardiologist:  Tenny Craw    Chief Complaint  Patient presents with  . Coronary Artery Disease    Follow up visit - seen for Dr. Tenny Craw    History of Present Illness: Tommy Laurel. is a 51 y.o. male who presents today for a 6 month check. Seen for Dr. Tenny Craw.   He has a historyof tobacco abuse ongoing, untreated hyperlipidemia, lower extremity PAD s/p aortobifemoral bypass graft by Dr. Imogene Burn in 12/2012, and bilateral carotid artery disease . Myoview stress test in 2014 was normal without ischemia, LVEF was normal 57%. He also has bilateral carotid artery disease with evidence of left subclavian stenosis. Carotid Doppler performed in January 2015 showed 40% right ICA stenosis and 40-59% left ICA stenosis. There was no documentation of the degree of his left subclavian stenosis.   He presented with CP and NSTEMI in 12/2014 and Cardiac catheterizationshowed 99% proximal LAD lesion treated with DES (Xience 3.5 x 33 mm DES), 60% D2 stenosis, 70% proximal to mid LAD lesion covered with same stent, 50% mid RCA lesion, 30% distal RCA lesion, EF 45-50%. Postprocedure, he was enrolled in Wallace trial and placed on aspirin and Brilinta. Echocardiogram was obtained on 12/9 which showed EF 55-60%.   In June of 2017 he had sudden onset of SSCP with no radiation but associated with severe diaphoresis and SOB. Pain was identical to his prior MI. He took a SL NTG without any improvement and called EMS. He was given ASA and Heparin bolus 5000 units. EKG showed ST elevated in V1 and V2, aVL and I with reciprocal ST depression in the inferior leads and code STEMI was called.    Patient underwent cardiac catheterization on 07/03/2015 95% ost D2 treated with suboptimal PTCA with 55% residual stenosis, 55% mid LAD, 55% mid LAD ISR treated with PTCA, 99% ost 1st  septal perforator, 50% mid RCA. Echocardiogram obtained on 07/04/2015 showed EF 60-65%. Unfortunately during the cardiac catheterization septal perforator was jailed, post cath, his troponin trended up to 8.32. He did well clinically.      I then saw him for his post hospital visit - had not been back here since until April of 2018 - needed to go back to work for money. Still smoking. No chest pain. His anginal equivalent is very GI in nature. At his last visit back in April - he was doing ok - had gotten insurance and was able to get Chantix and had stopped smoking.    Comes in today. Here alone. He is doing well. He continues to have chest pain - mostly different from his prior chest pain syndrome - that was more indigestion. Nothing exertional. Works 6 days a week. Stands and walks a lot at work. Occasionally will go and walk - no chest pain with this. Little short of breath - not smoking. Still talking Chantix. Long standing anxiety and depression. Ran out of his prescriptions for Klonopin and Zoloft - asking for med. He notes he "worries himself to death". In regards to his chest pain - he really feels that overall he is much better than where we started.   Past Medical History:  Diagnosis Date  . Acid reflux   . Anxiety   . CAD S/P percutaneous coronary angioplasty 01/06/2015   a. NSTEMI - Xience DES 3.5 x  33 p-mLAD; 60% D1, 50% mRCA   b. 95% ost D2 treated with suboptimal PTCA with 55% residual stenosis, 55% mid LAD, 55% mid LAD ISR treated with PTCA, 99% ost 1st septal perforator, 50% mid RCA  . Depression   . Hemorrhoid   . History of kidney stones   . NSTEMI (non-ST elevated myocardial infarction) (HCC) 12/2014  . Peripheral vascular disease [facility])     Past Surgical History:  Procedure Laterality Date  . ABDOMINAL AORTAGRAM N/A 12/18/2012   Procedure: ABDOMINAL AORTAGRAM;  Surgeon: Fransisco Hertz, MD;  Location: [facility] CATH LAB;  Service: Cardiovascular;  Laterality: N/A;  . AORTA -  BILATERAL FEMORAL ARTERY BYPASS GRAFT N/A 01/07/2013   Procedure: AORTA BIFEMORAL BYPASS GRAFT;  Surgeon: Fransisco Hertz, MD;  Location[facility] OR;  Service: Vascular;  Laterality: N/A;  . CARDIAC CATHETERIZATION N/A 01/06/2015   Procedure: Left Heart Cath and Coronary Angiography;  Surgeon: Kathleene Hazel, MD;  Location: [facility] INVASIVE CV LAB;  Service: Cardiovascular;  Laterality: N/A;  . CARDIAC CATHETERIZATION N/A 01/06/2015   Procedure: Coronary Stent Intervention;  Surgeon: Kathleene Hazel, MD;  Location: [facility] INVASIVE CV LAB;  Service: Cardiovascular: Xience DES 3.5 mm x 33 mm p-mLAD  . CARDIAC CATHETERIZATION N/A 07/02/2015   Procedure: Left Heart Cath and Coronary Angiography;  Surgeon: Marykay Lex, MD;  Location: [facility] INVASIVE CV LAB;  Service: Cardiovascular;  Laterality: N/A;  . CARDIAC CATHETERIZATION N/A 07/02/2015   Procedure: Coronary Stent Intervention;  Surgeon: Marykay Lex, MD;  Location: [facility] INVASIVE CV LAB;  Service: Cardiovascular;  Laterality: N/A;  . ELBOW SURGERY Bilateral   . KIDNEY STONE SURGERY     x2     Medications: Current Meds  Medication Sig  . aspirin 81 MG chewable tablet Chew 1 tablet (81 mg total) by mouth daily.  [name] atorvastatin (LIPITOR) 80 MG tablet Take 1 tablet (80 mg total) by mouth daily at 6 PM.  . BRILINTA 90 MG TABS tablet TAKE 1 TABLET (90 MG TOTAL) BY MOUTH 2 (TWO) TIMES DAILY.  [name] ibuprofen (ADVIL,MOTRIN) 200 MG tablet Take 400-600 mg by mouth every 6 (six) hours as needed for headache (pain).  . metoprolol tartrate (LOPRESSOR) 25 MG tablet TAKE 1/2 TABLET BY MOUTH TWICE A DAY  . nitroGLYCERIN (NITROSTAT) 0.4 MG SL tablet Place 1 tablet (0.4 mg total) under the tongue every 5 (five) minutes as needed for chest pain (x 3 doses).  [name] omeprazole (PRILOSEC) 20 MG capsule Take 1 capsule (20 mg total) by mouth daily.  . sertraline (ZOLOFT) 100 MG tablet Take 1 tablet (100 mg total) by mouth daily.  . sildenafil (REVATIO) 20 MG tablet Take 60 mg  by mouth as needed.   . varenicline (CHANTIX CONTINUING MONTH PAK) 1 MG tablet Take 1 tablet (1 mg total) by mouth 2 (two) times daily.  . [DISCONTINUED] sertraline (ZOLOFT) 100 MG tablet Take 1 tablet (100 mg total) by mouth daily.     Allergies: Allergies  Allergen Reactions  . Simvastatin Nausea Only    Stomach aches    Social History: The patient  reports that he quit smoking about 7 months ago. His smoking use included Cigarettes and E-cigarettes. He has a 30.00 pack-year smoking history. He has never used smokeless tobacco. He reports that he does not drink alcohol or use drugs.   Family History: The patient's family history includes Cancer in his paternal grandfather; Heart attack in his father and mother; Heart disease in his father, maternal  grandmother, and mother; Hyperlipidemia in his father and mother.   Review of Systems: Please see the history of present illness.   Otherwise, the review of systems is positive for none.   All other systems are reviewed and negative.   Physical Exam: VS:  BP 118/80   Pulse (!) 53   Ht 5\' 9"  (1.753 m)   Wt 171 lb 12.8 oz (77.9 kg)   BMI 25.37 kg/m  .  BMI Body mass index is 25.37 kg/m.  Wt Readings from Last 3 Encounters:  11/26/16 171 lb 12.8 oz (77.9 kg)  05/22/16 171 lb 1.9 oz (77.6 kg)  07/11/15 168 lb (76.2 kg)    General: Pleasant. Well developed, well nourished and in no acute distress.  Says he has stopped smoking but smells of tobacco.  HEENT: Normal.  Neck: Supple, no JVD, carotid bruits, or masses noted.  Cardiac: Regular rate and rhythm. No murmurs, rubs, or gallops. No edema.  Respiratory:  Lungs are clear to auscultation bilaterally with normal work of breathing.  GI: Soft and nontender.  MS: No deformity or atrophy. Gait and ROM intact.  Skin: Warm and dry. Color is normal.  Neuro:  Strength and sensation are intact and no gross focal deficits noted.  Psych: Alert, appropriate and with normal  affect.   LABORATORY DATA:  EKG:  EKG is ordered today. This demonstrates sinus brady.  Lab Results  Component Value Date   WBC 12.6 (H) 05/22/2016   HGB 15.3 05/22/2016   HCT 46.3 05/22/2016   PLT 210 05/22/2016   GLUCOSE 84 05/22/2016   CHOL 165 05/22/2016   TRIG 128 05/22/2016   HDL 39 (L) 05/22/2016   LDLCALC 100 (H) 05/22/2016   ALT 18 05/22/2016   AST 17 05/22/2016   NA 140 05/22/2016   K 4.5 05/22/2016   CL 101 05/22/2016   CREATININE 1.16 05/22/2016   BUN 20 05/22/2016   CO2 23 05/22/2016   TSH 1.530 07/03/2015   INR 1.08 07/03/2015   INR 1.08 07/03/2015   HGBA1C 5.7 (H) 07/03/2015     BNP (last 3 results) No results for input(s): BNP in the last 8760 hours.  ProBNP (last 3 results) No results for input(s): PROBNP in the last 8760 hours.   Other Studies Reviewed Today:  Cath 07/03/2015    Conclusion   1. Ost 2nd Diag to 2nd Diag lesion, 95% stenosed. Post PTCA intervention, there is a 55% residual stenosis. 2. Mid LAD lesion, 55% stenosed. Involved in the bifurcation lesion. This is in-stent restenosis. Post intervention - PTCA only, there is a 5% residual stenosis. 3. Ost 1st Sept lesion, 99% stenosed. 4. Mid RCA lesion, 50% stenosed. Dist RCA lesion, 30% stenosed. 5. Prox Cx to Mid Cx lesion, 25% stenosed. 6. Elevated LVEDP  For his lateral ST elevations, likely culprit lesion is the jailed D2 vessel with a 95% stenosis.  Ultimately the PTCA result on D2 was suboptimal, but after 2 angioplasty balloons ruptured at this lesion, I felt it safest to abort any further attempts. There was also given plaque shift into the stented segment of the LAD. This was treated with post-dilation balloon PTCA. Unfortunately in part of this process, the jailed septal perforator trunk with a 99% stenosis that had TIMI 2 flow initially and TIMI 1 flow at the end of the case. The patient was still having 5 out of 10 anginal pain as well as GERD symptoms at the end of  the case. He was given  4 mg of morphine, 20 mg Pepcid IV, and the nitroglycerin glycerin infusion was increased. Upon arrival to the CCU room, the patient was relatively pain-free and feeling better. I suspect his pain was coming from the jailed septal perforator trunk that had TIMI 1 flow at the end of the case, likely related to increased LVEDP from systemic hypertension leading to high wall stress.   Plan:  Admit to CCU for post PTCA/PCI care.  TR band removal per protocol  Aggrastat infusion for 6 hours  Consider IV heparin for 24 hours, starting 8 hours after sheath removal  Nitrogen infusion for afterload reduction  1 dose of IV Lasix to be administered upon arrival CCU. - 20 mg  When necessary morphine for pain control  Based on this presentation, I would prefer not to fast track, however if he looks stable by the next day, to consider. He is already on Brilinta, but would need care management assistance to see if we can figure out a way to get his medications on discharge. Apparently has not been taking a lot of his medications besides Brilinta.  He needs to stop Smoking. Dr. Herbie Baltimore       Echo Study Conclusions 06/2015  - Left ventricle: The cavity size was normal. Systolic function was  normal. The estimated ejection fraction was in the range of 60%  to 65%. Wall motion was normal; there were no regional wall  motion abnormalities. Left ventricular diastolic function  parameters were normal. - Aortic valve: There was no regurgitation. - Mitral valve: Structurally normal valve. - Right ventricle: The cavity size was normal. Wall thickness was  normal. Systolic function was normal. - Right atrium: The atrium was normal in size. - Tricuspid valve: There was trivial regurgitation. - Pulmonary arteries: Systolic pressure was within the normal  range. - Inferior vena cava: The vessel was normal in size. - Pericardium, extracardiac: There was no pericardial  effusion.  Impressions:  - No regional wall motion abnormalities were seen on Definity  images.  Assessment/Plan:  1. CAD with recurrent angina - s/p cardiac cath 06/2015 with angioplasty to 1st diagonal - has had "jailed" septal perforator - angioplasty result not ideal.  He notes some occasional chest pain - not exertional - and overall feels like he is improved - will follow for now. He knows to let us know if he has progression/worsening of symptoms.  Continue Brilinta and aspirin along with CV risk factor modification. He has stopped smoking.   2. HLD - on statin - labs today  3. Tobacco abuse - says he has stopped - would stop Chantix.   4. GERD - he remains on PPI therapy.   5. PVD - no claudication reported  6. Anxiety/depression - I do not mind restarting his Zoloft - he will need to see psyche or his PCP about Klonopin.   Current medicines are reviewed with the patient today.  The patient does not have concerns regarding medicines other than what has been noted above.  The following changes have been made:  See above.  Labs/ tests ordered today include:    Orders Placed This Encounter  Procedures  . Basic metabolic panel  . CBC  . Hepatic function panel  . Lipid panel  . EKG 12-Lead     Disposition:   FU with me in 6 months with fasting labs.   Patient is agreeable to this plan and will call if any problems develop in the interim.   SignedNorma Fredrickson,  NP  11/26/2016 10:56 AM  [facility] [address] [place], [place]  [address] Phone: [phone] Fax: [phone]

## 2016-11-26 NOTE — Patient Instructions (Addendum)
We will be checking the following labs today - BMET, CBC, HPF, Lipids   Medication Instructions:    Continue with your current medicines.   I added back your Zoloft today    Testing/Procedures To Be Arranged:  N/A  Follow-Up:   See me in 6 months with fasting labs    Other Special Instructions:   N/A    If you need a refill on your cardiac medications before your next appointment, please call your pharmacy.   Call the [facility] office at [phone] if you have any questions, problems or concerns.

## 2016-11-27 ENCOUNTER — Telehealth: Payer: Self-pay | Admitting: Nurse Practitioner

## 2016-11-27 NOTE — Telephone Encounter (Signed)
Informed patient of results and verbal understanding expressed.  Reiterated to patient the importance of taking Lipitor daily as directed.  He was grateful for call and agrees with treatment plan.

## 2016-11-27 NOTE — Telephone Encounter (Signed)
-----   Message from Rosalio MacadamiaLori C Gerhardt, NP sent at 11/26/2016  4:10 PM EDT ----- Ok to report. Labs are stable - lipids not as good - make sure he is taking his Lipitor 80 mg as advised. For now, would continue on current regimen as outlined at his visit.

## 2016-11-27 NOTE — Telephone Encounter (Signed)
Returning a call about test results . Please call  °

## 2017-04-03 ENCOUNTER — Other Ambulatory Visit: Payer: Self-pay | Admitting: Cardiology

## 2017-05-27 ENCOUNTER — Ambulatory Visit: Payer: BLUE CROSS/BLUE SHIELD | Admitting: Nurse Practitioner

## 2017-05-27 NOTE — Progress Notes (Deleted)
CARDIOLOGY OFFICE NOTE  Date:  05/27/2017    Tommy Malone. Date of Birth: 05-Oct-1965 Medical Record #[id number]  PCP:  Renford Dills, MD  Cardiologist:  Belia Heman    No chief complaint on file.   History of Present Illness: Tommy Malone. is a 52 y.o. male who presents today for a 6 month check. Seen for Dr. Tenny Craw.   He has a history of ongoing tobacco abuse, untreated HLD, lower extremity PAD s/p aortobifemoral bypass by Dr. Imogene Burn in 2014, and bilateral carotid disease. Myoview in 2014 was normal without ischemia with EF of 57%.  He also has bilateral carotid artery disease with evidence of left subclavian stenosis. Carotid Doppler performed in January 2015 showed 40% right ICA stenosis and 40-59% left ICA stenosis. There was no documentation of the degree of his left subclavian stenosis.   He presented with CP and NSTEMI in 12/2014 and Cardiac catheterizationshowed 99% proximal LAD lesion treated with DES (Xience 3.5 x 33 mm DES), 60% D2 stenosis, 70% proximal to mid LAD lesion covered with same stent, 50% mid RCA lesion, 30% distal RCA lesion, EF 45-50%. Postprocedure, he was enrolled in [place] trial and placed on aspirin and Brilinta. Echocardiogram was obtained on 12/9 which showed EF 55-60%.  In June of 2017 he had sudden onset of SSCP with no radiation but associated with severe diaphoresis and SOB. Pain was identical to his prior MI. He took a SL NTG without any improvement and called EMS. He was given ASA and Heparin bolus 5000 units. EKG showed ST elevated in V1 and V2, aVL and I with reciprocal ST depression in the inferior leads and code STEMI was called.   Patient underwent cardiac catheterization on 07/03/2015 95% ost D2 treated with suboptimal PTCA with 55% residual stenosis, 55% mid LAD, 55% mid LAD ISR treated with PTCA, 99% ost 1st septal perforator, 50% mid RCA. Echocardiogram obtained on 07/04/2015 showed EF 60-65%. Unfortunately during the cardiac  catheterization septal perforator was jailed, post cath, his troponin trended up to 8.32. He did well clinically.  I have seen him several times since that time - he has been able to stop smoking with Chantix. Last seen in October - he has continued to have chest pain - different from prior chest pain syndrome and overall he thought he was doing better.  Had ran out of his Klonopin and Zoloft. Was still using Chantix.   Comes in today. Here alone.    Past Medical History:  Diagnosis Date  . Acid reflux   . Anxiety   . CAD S/P percutaneous coronary angioplasty 01/06/2015   a. NSTEMI - Xience DES 3.5 x 33 p-mLAD; 60% D1, 50% mRCA   b. 95% ost D2 treated with suboptimal PTCA with 55% residual stenosis, 55% mid LAD, 55% mid LAD ISR treated with PTCA, 99% ost 1st septal perforator, 50% mid RCA  . Depression   . Hemorrhoid   . History of kidney stones   . NSTEMI (non-ST elevated myocardial infarction) (HCC) 12/2014  . Peripheral vascular disease [facility])     Past Surgical History:  Procedure Laterality Date  . ABDOMINAL AORTAGRAM N/A 12/18/2012   Procedure: ABDOMINAL AORTAGRAM;  Surgeon: Fransisco Hertz, MD;  Location: [facility] CATH LAB;  Service: Cardiovascular;  Laterality: N/A;  . AORTA - BILATERAL FEMORAL ARTERY BYPASS GRAFT N/A 01/07/2013   Procedure: AORTA BIFEMORAL BYPASS GRAFT;  Surgeon: Fransisco Hertz, MD;  Location: [facility] OR;  Service: Vascular;  Laterality: N/A;  . CARDIAC CATHETERIZATION N/A 01/06/2015   Procedure: Left Heart Cath and Coronary Angiography;  Surgeon: Kathleene Hazel, MD;  Location: [facility] INVASIVE CV LAB;  Service: Cardiovascular;  Laterality: N/A;  . CARDIAC CATHETERIZATION N/A 01/06/2015   Procedure: Coronary Stent Intervention;  Surgeon: Kathleene Hazel, MD;  Location: [facility] INVASIVE CV LAB;  Service: Cardiovascular: Xience DES 3.5 mm x 33 mm p-mLAD  . CARDIAC CATHETERIZATION N/A 07/02/2015   Procedure: Left Heart Cath and Coronary Angiography;  Surgeon: Marykay Lex, MD;   Location[facility] INVASIVE CV LAB;  Service: Cardiovascular;  Laterality: N/A;  . CARDIAC CATHETERIZATION N/A 07/02/2015   Procedure: Coronary Stent Intervention;  Surgeon: Marykay Lex, MD;  Location: [facility] INVASIVE CV LAB;  Service: Cardiovascular;  Laterality: N/A;  . ELBOW SURGERY Bilateral   . KIDNEY STONE SURGERY     x2     Medications: No outpatient medications have been marked as taking for the 05/27/17 encounter (Appointment) with Rosalio Macadamia, NP.     Allergies: Allergies  Allergen Reactions  . Simvastatin Nausea Only    Stomach aches    Social History: The patient  reports that he quit smoking about 13 months ago. His smoking use included cigarettes and e-cigarettes. He has a 30.00 pack-year smoking history. He has never used smokeless tobacco. He reports that he does not drink alcohol or use drugs.   Family History: The patient's family history includes Cancer in his paternal grandfather; Heart attack in his father and mother; Heart disease in his father, maternal grandmother, and mother; Hyperlipidemia in his father and mother.   Review of Systems: Please see the history of present illness.   Otherwise, the review of systems is positive for none.   All other systems are reviewed and negative.   Physical Exam: VS:  There were no vitals taken for this visit. [name]  BMI There is no height or weight on file to calculate BMI.  Wt Readings from Last 3 Encounters:  11/26/16 171 lb 12.8 oz (77.9 kg)  05/22/16 171 lb 1.9 oz (77.6 kg)  07/11/15 168 lb (76.2 kg)    General: Pleasant. Well developed, well nourished and in no acute distress.   HEENT: Normal.  Neck: Supple, no JVD, carotid bruits, or masses noted.  Cardiac: Regular rate and rhythm. No murmurs, rubs, or gallops. No edema.  Respiratory:  Lungs are clear to auscultation bilaterally with normal work of breathing.  GI: Soft and nontender.  MS: No deformity or atrophy. Gait and ROM intact.  Skin: Warm and dry. Color is  normal.  Neuro:  Strength and sensation are intact and no gross focal deficits noted.  Psych: Alert, appropriate and with normal affect.   LABORATORY DATA:  EKG:  EKG is ordered today. This demonstrates .  Lab Results  Component Value Date   WBC 7.2 11/26/2016   HGB 16.5 11/26/2016   HCT 46.8 11/26/2016   PLT 198 11/26/2016   GLUCOSE 94 11/26/2016   CHOL 197 11/26/2016   TRIG 124 11/26/2016   HDL 44 11/26/2016   LDLCALC 128 (H) 11/26/2016   ALT 17 11/26/2016   AST 17 11/26/2016   NA 138 11/26/2016   K 5.0 11/26/2016   CL 100 11/26/2016   CREATININE 1.29 (H) 11/26/2016   BUN 12 11/26/2016   CO2 25 11/26/2016   TSH 1.530 07/03/2015   INR 1.08 07/03/2015   INR 1.08 07/03/2015   HGBA1C 5.7 (H) 07/03/2015     BNP (  last 3 results) No results for input(s): BNP in the last 8760 hours.  ProBNP (last 3 results) No results for input(s): PROBNP in the last 8760 hours.   Other Studies Reviewed Today:  Cath 07/03/2015    Conclusion   1. Ost 2nd Diag to 2nd Diag lesion, 95% stenosed. Post PTCA intervention, there is a 55% residual stenosis. 2. Mid LAD lesion, 55% stenosed. Involved in the bifurcation lesion. This is in-stent restenosis. Post intervention - PTCA only, there is a 5% residual stenosis. 3. Ost 1st Sept lesion, 99% stenosed. 4. Mid RCA lesion, 50% stenosed. Dist RCA lesion, 30% stenosed. 5. Prox Cx to Mid Cx lesion, 25% stenosed. 6. Elevated LVEDP  For his lateral ST elevations, likely culprit lesion is the jailed D2 vessel with a 95% stenosis.  Ultimately the PTCA result on D2 was suboptimal, but after 2 angioplasty balloons ruptured at this lesion, I felt it safest to abort any further attempts. There was also given plaque shift into the stented segment of the LAD. This was treated with post-dilation balloon PTCA. Unfortunately in part of this process, the jailed septal perforator trunk with a 99% stenosis that had TIMI 2 flow initially and TIMI 1 flow at  the end of the case. The patient was still having 5 out of 10 anginal pain as well as GERD symptoms at the end of the case. He was given 4 mg of morphine, 20 mg Pepcid IV, and the nitroglycerin glycerin infusion was increased. Upon arrival to the CCU room, the patient was relatively pain-free and feeling better. I suspect his pain was coming from the jailed septal perforator trunk that had TIMI 1 flow at the end of the case, likely related to increased LVEDP from systemic hypertension leading to high wall stress.   Plan:  Admit to CCU for post PTCA/PCI care.  TR band removal per protocol  Aggrastat infusion for 6 hours  Consider IV heparin for 24 hours, starting 8 hours after sheath removal  Nitrogen infusion for afterload reduction  1 dose of IV Lasix to be administered upon arrival CCU. - 20 mg  When necessary morphine for pain control  Based on this presentation, I would prefer not to fast track, however if he looks stable by the next day, to consider. He is already on Brilinta, but would need care management assistance to see if we can figure out a way to get his medications on discharge. Apparently has not been taking a lot of his medications besides Brilinta.  He needs to stop Smoking. Dr. Herbie Baltimore       Echo Study Conclusions 06/2015  - Left ventricle: The cavity size was normal. Systolic function was  normal. The estimated ejection fraction was in the range of 60%  to 65%. Wall motion was normal; there were no regional wall  motion abnormalities. Left ventricular diastolic function  parameters were normal. - Aortic valve: There was no regurgitation. - Mitral valve: Structurally normal valve. - Right ventricle: The cavity size was normal. Wall thickness was  normal. Systolic function was normal. - Right atrium: The atrium was normal in size. - Tricuspid valve: There was trivial regurgitation. - Pulmonary arteries: Systolic pressure was within the normal   range. - Inferior vena cava: The vessel was normal in size. - Pericardium, extracardiac: There was no pericardial effusion.  Impressions:  - No regional wall motion abnormalities were seen on Definity  images.  Assessment/Plan:  1. CAD with recurrent angina - s/p cardiac cath 06/2015 with  angioplasty to 1st diagonal - has had "jailed" septal perforator - angioplasty result not ideal.  He notes some occasional chest pain - not exertional - and overall feels like he is improved - will follow for now. He knows to let us know if he has progression/worsening of symptoms.  Continue Brilinta and aspirin along with CV risk factor modification. He has stopped smoking.   2. HLD - on statin - labs today  3. Tobacco abuse - says he has stopped - would stop Chantix.   4. GERD - he remains on PPI therapy.   5. PVD - no claudication reported  6. Anxiety/depression - I do not mind restarting his Zoloft - he will need to see psyche or his PCP about Klonopin.             Current medicines are reviewed with the patient today.  The patient does not have concerns regarding medicines other than what has been noted above.  The following changes have been made:  See above.  Labs/ tests ordered today include:   No orders of the defined types were placed in this encounter.    Disposition:   FU with *** in {gen number 1-61:096045} {Days to years:10300}.   Patient is agreeable to this plan and will call if any problems develop in the interim.   SignedNorma Fredrickson, NP  05/27/2017 7:47 AM  [facility] [address] [place], [place]  [address] Phone: [phone] Fax: [phone]

## 2017-07-12 ENCOUNTER — Other Ambulatory Visit: Payer: Self-pay | Admitting: Physician Assistant

## 2017-07-12 ENCOUNTER — Other Ambulatory Visit: Payer: Self-pay | Admitting: Nurse Practitioner

## 2017-07-16 ENCOUNTER — Other Ambulatory Visit: Payer: Self-pay | Admitting: Physician Assistant

## 2017-07-22 ENCOUNTER — Ambulatory Visit: Payer: BLUE CROSS/BLUE SHIELD | Admitting: Nurse Practitioner

## 2017-07-22 DIAGNOSIS — R0989 Other specified symptoms and signs involving the circulatory and respiratory systems: Secondary | ICD-10-CM

## 2017-07-22 NOTE — Progress Notes (Deleted)
CARDIOLOGY OFFICE NOTE  Date:  07/22/2017     Tommy Malone. Date of Birth: 04-08-1965 Medical Record #[id number]  PCP:  Renford Dills, MD  Cardiologist:  Tyrone Sage & ***    No chief complaint on file.   History of Present Illness: Tommy Kersh. is a 52 y.o. male who presents today for a *** Seen for Dr. Tenny Craw.   He has a historyof tobacco abuse ongoing, untreated hyperlipidemia, lower extremity PAD s/p aortobifemoral bypass graft by Dr. Imogene Burn in 12/2012, and bilateral carotid artery disease . Myoview stress test in 2014 was normal without ischemia, LVEF was normal 57%. He also has bilateral carotid artery disease with evidence of left subclavian stenosis. Carotid Doppler performed in January 2015 showed 40% right ICA stenosis and 40-59% left ICA stenosis. There was no documentation of the degree of his left subclavian stenosis.   He presented with CP and NSTEMI in 12/2014 and Cardiac catheterizationshowed 99% proximal LAD lesion treated with DES (Xience 3.5 x 33 mm DES), 60% D2 stenosis, 70% proximal to mid LAD lesion covered with same stent, 50% mid RCA lesion, 30% distal RCA lesion, EF 45-50%. Postprocedure, he was enrolled in [place] trial and placed on aspirin and Brilinta. Echocardiogram was obtained on 12/9 which showed EF 55-60%.   In June of 2017 he had sudden onset of SSCP with no radiation but associated with severe diaphoresis and SOB. Pain was identical to his prior MI. He took a SL NTG without any improvement and called EMS. He was given ASA and Heparin bolus 5000 units. EKG showed ST elevated in V1 and V2, aVL and I with reciprocal ST depression in the inferior leads and code STEMI was called.    Patient underwent cardiac catheterization on 07/03/2015 95% ost D2 treated with suboptimal PTCA with 55% residual stenosis, 55% mid LAD, 55% mid LAD ISR treated with PTCA, 99% ost 1st septal perforator, 50% mid RCA. Echocardiogram obtained on 07/04/2015 showed EF  60-65%. Unfortunately during the cardiac catheterization septal perforator was jailed, post cath, his troponin trended up to 8.32. He did well clinically.      I then saw him for his post hospital visit - had not been back here since until April of 2018 - needed to go back to work for money. Still smoking. No chest pain. His anginal equivalent is very GI in nature. At his last visit back in April - he was doing ok - had gotten insurance and was able to get Chantix and had stopped smoking.   Comes in today. Here alone. He is doing well. He continues to have chest pain - mostly different from his prior chest pain syndrome - that was more indigestion. Nothing exertional. Works 6 days a week. Stands and walks a lot at work. Occasionally will go and walk - no chest pain with this. Little short of breath - not smoking. Still talking Chantix. Long standing anxiety and depression. Ran out of his prescriptions for Klonopin and Zoloft - asking for med. He notes he "worries himself to death". In regards to his chest pain - he really feels that overall he is much better than where we started.              Comes in today. Here with   Past Medical History:  Diagnosis Date  . Acid reflux   . Anxiety   . CAD S/P percutaneous coronary angioplasty 01/06/2015   a. NSTEMI - Xience DES 3.5 x  33 p-mLAD; 60% D1, 50% mRCA   b. 95% ost D2 treated with suboptimal PTCA with 55% residual stenosis, 55% mid LAD, 55% mid LAD ISR treated with PTCA, 99% ost 1st septal perforator, 50% mid RCA  . Depression   . Hemorrhoid   . History of kidney stones   . NSTEMI (non-ST elevated myocardial infarction) (HCC) 12/2014  . Peripheral vascular disease [facility](HCC)     Past Surgical History:  Procedure Laterality Date  . ABDOMINAL AORTAGRAM N/A 12/18/2012   Procedure: ABDOMINAL AORTAGRAM;  Surgeon: Fransisco HertzBrian L Chen, MD;  [facility];  Service: Cardiovascular;  Laterality: N/A;  . AORTA - BILATERAL FEMORAL ARTERY  BYPASS GRAFT N/A 01/07/2013   Procedure: AORTA BIFEMORAL BYPASS GRAFT;  Surgeon: Fransisco HertzBrian L Chen, MD;  [facility];  Service: Vascular;  Laterality: N/A;  . CARDIAC CATHETERIZATION N/A 01/06/2015   Procedure: Left Heart Cath and Coronary Angiography;  Surgeon: Kathleene Hazelhristopher D McAlhany, MD;  [facility];  Service: Cardiovascular;  Laterality: N/A;  . CARDIAC CATHETERIZATION N/A 01/06/2015   Procedure: Coronary Stent Intervention;  Surgeon: Kathleene Hazelhristopher D McAlhany, MD;  Location: [facility] INVASIVE CV LAB;  Service: Cardiovascular: Xience DES 3.5 mm x 33 mm p-mLAD  . CARDIAC CATHETERIZATION N/A 07/02/2015   Procedure: Left Heart Cath and Coronary Angiography;  Surgeon: Marykay Lexavid W Harding, MD;  [facility];  Service: Cardiovascular;  Laterality: N/A;  . CARDIAC CATHETERIZATION N/A 07/02/2015   Procedure: Coronary Stent Intervention;  Surgeon: Marykay Lexavid W Harding, MD;  [facility];  Service: Cardiovascular;  Laterality: N/A;  . ELBOW SURGERY Bilateral   . KIDNEY STONE SURGERY     x2     Medications: No outpatient medications have been marked as taking for the 07/22/17 encounter (Appointment) with Rosalio MacadamiaGerhardt, Markia Kyer C, NP.     Allergies: Allergies  Allergen Reactions  . Simvastatin Nausea Only    Stomach aches    Social History: The patient  reports that he quit smoking about 14 months ago. His smoking use included cigarettes and e-cigarettes. He has a 30.00 pack-year smoking history. He has never used smokeless tobacco. He reports that he does not drink alcohol or use drugs.   Family History: The patient's ***family history includes Cancer in his paternal grandfather; Heart attack in his father and mother; Heart disease in his father, maternal grandmother, and mother; Hyperlipidemia in his father and mother.   Review of Systems: Please see the history of present illness.   Otherwise, the review of systems is positive for {NONE DEFAULTED:18576::"none"}.   All  other systems are reviewed and negative.   Physical Exam: VS:  There were no vitals taken for this visit. [name].  BMI There is no height or weight on file to calculate BMI.  Wt Readings from Last 3 Encounters:  11/26/16 171 lb 12.8 oz (77.9 kg)  05/22/16 171 lb 1.9 oz (77.6 kg)  07/11/15 168 lb (76.2 kg)    General: Pleasant. Well developed, well nourished and in no acute distress.   HEENT: Normal.  Neck: Supple, no JVD, carotid bruits, or masses noted.  Cardiac: ***Regular rate and rhythm. No murmurs, rubs, or gallops. No edema.  Respiratory:  Lungs are clear to auscultation bilaterally with normal work of breathing.  GI: Soft and nontender.  MS: No deformity or atrophy. Gait and ROM intact.  Skin: Warm and dry. Color is normal.  Neuro:  Strength and sensation are intact and no gross focal deficits noted.  Psych:  Alert, appropriate and with normal affect.   LABORATORY DATA:  EKG:  EKG {ACTION; IS/IS [id number]:[id number]} ordered today. This demonstrates ***.  Lab Results  Component Value Date   WBC 7.2 11/26/2016   HGB 16.5 11/26/2016   HCT 46.8 11/26/2016   PLT 198 11/26/2016   GLUCOSE 94 11/26/2016   CHOL 197 11/26/2016   TRIG 124 11/26/2016   HDL 44 11/26/2016   LDLCALC 128 (H) 11/26/2016   ALT 17 11/26/2016   AST 17 11/26/2016   NA 138 11/26/2016   K 5.0 11/26/2016   CL 100 11/26/2016   CREATININE 1.29 (H) 11/26/2016   BUN 12 11/26/2016   CO2 25 11/26/2016   TSH 1.530 07/03/2015   INR 1.08 07/03/2015   INR 1.08 07/03/2015   HGBA1C 5.7 (H) 07/03/2015     BNP (last 3 results) No results for input(s): BNP in the last 8760 hours.  ProBNP (last 3 results) No results for input(s): PROBNP in the last 8760 hours.   Other Studies Reviewed Today:   Assessment/Plan: Cath 07/03/2015    Conclusion   1. Ost 2nd Diag to 2nd Diag lesion, 95% stenosed. Post PTCA intervention, there is a 55% residual stenosis. 2. Mid LAD lesion, 55% stenosed. Involved in the bifurcation  lesion. This is in-stent restenosis. Post intervention - PTCA only, there is a 5% residual stenosis. 3. Ost 1st Sept lesion, 99% stenosed. 4. Mid RCA lesion, 50% stenosed. Dist RCA lesion, 30% stenosed. 5. Prox Cx to Mid Cx lesion, 25% stenosed. 6. Elevated LVEDP  For his lateral ST elevations, likely culprit lesion is the jailed D2 vessel with a 95% stenosis.  Ultimately the PTCA result on D2 was suboptimal, but after 2 angioplasty balloons ruptured at this lesion, I felt it safest to abort any further attempts. There was also given plaque shift into the stented segment of the LAD. This was treated with post-dilation balloon PTCA. Unfortunately in part of this process, the jailed septal perforator trunk with a 99% stenosis that had TIMI 2 flow initially and TIMI 1 flow at the end of the case. The patient was still having 5 out of 10 anginal pain as well as GERD symptoms at the end of the case. He was given 4 mg of morphine, 20 mg Pepcid IV, and the nitroglycerin glycerin infusion was increased. Upon arrival to the CCU room, the patient was relatively pain-free and feeling better. I suspect his pain was coming from the jailed septal perforator trunk that had TIMI 1 flow at the end of the case, likely related to increased LVEDP from systemic hypertension leading to high wall stress.   Plan:  Admit to CCU for post PTCA/PCI care.  TR band removal per protocol  Aggrastat infusion for 6 hours  Consider IV heparin for 24 hours, starting 8 hours after sheath removal  Nitrogen infusion for afterload reduction  1 dose of IV Lasix to be administered upon arrival CCU. - 20 mg  When necessary morphine for pain control  Based on this presentation, I would prefer not to fast track, however if he looks stable by the next day, to consider. He is already on Brilinta, but would need care management assistance to see if we can figure out a way to get his medications on discharge. Apparently has not  been taking a lot of his medications besides Brilinta.  He needs to stop Smoking. Dr. Herbie Baltimore       Echo Study Conclusions 06/2015  - Left ventricle: The cavity size  was normal. Systolic function was  normal. The estimated ejection fraction was in the range of 60%  to 65%. Wall motion was normal; there were no regional wall  motion abnormalities. Left ventricular diastolic function  parameters were normal. - Aortic valve: There was no regurgitation. - Mitral valve: Structurally normal valve. - Right ventricle: The cavity size was normal. Wall thickness was  normal. Systolic function was normal. - Right atrium: The atrium was normal in size. - Tricuspid valve: There was trivial regurgitation. - Pulmonary arteries: Systolic pressure was within the normal  range. - Inferior vena cava: The vessel was normal in size. - Pericardium, extracardiac: There was no pericardial effusion.  Impressions:  - No regional wall motion abnormalities were seen on Definity  images.  Assessment/Plan:  1. CAD with recurrent angina - s/p cardiac cath 06/2015 with angioplasty to 1st diagonal - has had "jailed" septal perforator - angioplasty result not ideal.  He notes some occasional chest pain - not exertional - and overall feels like he is improved - will follow for now. He knows to let us know if he has progression/worsening of symptoms.  Continue Brilinta and aspirin along with CV risk factor modification. He has stopped smoking.   2. HLD - on statin - labs today  3. Tobacco abuse - says he has stopped - would stop Chantix.   4. GERD - he remains on PPI therapy.   5. PVD - no claudication reported  6. Anxiety/depression - I do not mind restarting his Zoloft - he will need to see psyche or his PCP about Klonopin.             Current medicines are reviewed with the patient today.  The patient does not have concerns regarding medicines other than what has been noted  above.  The following changes have been made:  See above.  Labs/ tests ordered today include:   No orders of the defined types were placed in this encounter.    Disposition:   FU with *** in {gen number 1-61:096045} {Days to years:10300}.   Patient is agreeable to this plan and will call if any problems develop in the interim.   SignedNorma Fredrickson, NP  07/22/2017 7:36 AM  [facility] [address] [place], [place]  [address] Phone: [phone] Fax: [phone]

## 2017-07-23 ENCOUNTER — Encounter: Payer: Self-pay | Admitting: Nurse Practitioner

## 2017-10-23 ENCOUNTER — Other Ambulatory Visit: Payer: Self-pay | Admitting: Nurse Practitioner

## 2017-10-28 ENCOUNTER — Encounter: Payer: Self-pay | Admitting: Vascular Surgery

## 2017-11-27 ENCOUNTER — Other Ambulatory Visit: Payer: Self-pay | Admitting: Nurse Practitioner

## 2017-12-17 ENCOUNTER — Other Ambulatory Visit: Payer: Self-pay | Admitting: Nurse Practitioner

## 2017-12-17 NOTE — Telephone Encounter (Signed)
Please advise on refill request. Thanks, MI 

## 2017-12-25 ENCOUNTER — Other Ambulatory Visit: Payer: Self-pay | Admitting: Physician Assistant

## 2018-01-20 ENCOUNTER — Ambulatory Visit (INDEPENDENT_AMBULATORY_CARE_PROVIDER_SITE_OTHER): Payer: BLUE CROSS/BLUE SHIELD | Admitting: Nurse Practitioner

## 2018-01-20 ENCOUNTER — Encounter: Payer: Self-pay | Admitting: Nurse Practitioner

## 2018-01-20 VITALS — BP 100/68 | HR 53 | Ht 69.0 in | Wt 182.0 lb

## 2018-01-20 DIAGNOSIS — E7849 Other hyperlipidemia: Secondary | ICD-10-CM

## 2018-01-20 DIAGNOSIS — I259 Chronic ischemic heart disease, unspecified: Secondary | ICD-10-CM | POA: Diagnosis not present

## 2018-01-20 MED ORDER — METOPROLOL TARTRATE 25 MG PO TABS: 12.5000 mg | ORAL_TABLET | Freq: Two times a day (BID) | ORAL | 3 refills | Status: DC

## 2018-01-20 MED ORDER — OMEPRAZOLE 20 MG PO CPDR: 20.0000 mg | DELAYED_RELEASE_CAPSULE | Freq: Every day | ORAL | 3 refills | Status: DC

## 2018-01-20 MED ORDER — NITROGLYCERIN 0.4 MG SL SUBL: 0.4000 mg | SUBLINGUAL_TABLET | SUBLINGUAL | 2 refills | Status: DC | PRN

## 2018-01-20 MED ORDER — TICAGRELOR 90 MG PO TABS: 90.0000 mg | ORAL_TABLET | Freq: Two times a day (BID) | ORAL | 3 refills | Status: DC

## 2018-01-20 MED ORDER — ATORVASTATIN CALCIUM 80 MG PO TABS: 80.0000 mg | ORAL_TABLET | Freq: Every day | ORAL | 3 refills | Status: DC

## 2018-01-20 NOTE — Progress Notes (Signed)
CARDIOLOGY OFFICE NOTE  Date:  01/20/2018    Tommy CapersLarry W Etienne Jr. Date of Birth: May 29, 1965 Medical Record #[id number]  PCP:  Renford DillsPolite, Ronald, MD  Cardiologist:  Belia Heman[facility]hardt [facility]    Chief Complaint  Patient presents with  . Coronary Artery Disease    14 month check. Seen for Dr. Tenny Crawoss    History of Present Illness: Tommy CapersLarry W Pizano Jr. is a 52 y.o. male who presents today for a 14 month check. Seen for Dr. Tenny Crawoss. Typically follows with me.   He has a historyof tobacco abuse ongoing, untreated hyperlipidemia, lower extremity PAD s/p aortobifemoral bypass graft by Dr. Imogene Burnhen in 12/2012, and bilateral carotid artery disease . Myoview stress test in 2014 was normal without ischemia, LVEF was normal 57%. He also has bilateral carotid artery disease with evidence of left subclavian stenosis. Carotid Doppler performed in January 2015 showed 40% right ICA stenosis and 40-59% left ICA stenosis. There was no documentation of the degree of his left subclavian stenosis.   He presented with CP and NSTEMI in 12/2014 and Cardiac catheterizationshowed 99% proximal LAD lesion treated with DES (Xience 3.5 x 33 mm DES), 60% D2 stenosis, 70% proximal to mid LAD lesion covered with same stent, 50% mid RCA lesion, 30% distal RCA lesion, EF 45-50%. Postprocedure, he was enrolled in [place] trial and placed on aspirin and Brilinta. Echocardiogram was obtained on 12/9 which showed EF 55-60%.   In June of 2017 he had sudden onset of SSCP with no radiation but associated with severe diaphoresis and SOB. Pain was identical to his prior MI. He took a SL NTG without any improvement and called EMS. He was given ASA and Heparin bolus 5000 units. EKG showed ST elevated in V1 and V2, aVL and I with reciprocal ST depression in the inferior leads and code STEMI was called.    Patient underwent cardiac catheterization on 07/03/2015 95% ost D2 treated with suboptimal PTCA with 55% residual stenosis, 55% mid LAD, 55% mid LAD  ISR treated with PTCA, 99% ost 1st septal perforator, 50% mid RCA. Echocardiogram obtained on 07/04/2015 showed EF 60-65%. Unfortunately during the cardiac catheterization septal perforator was jailed, post cath, his troponin trended up to 8.32. He did well clinically.      I then saw him for his post hospital visit - had not been back here since until April of 2018 - needed to go back to work for money. Still smoking. No chest pain. His anginal equivalent is very GI in nature. I saw him in April of 2018l - he was doing ok - had gotten insurance and was able to get Chantix and had stopped smoking. Last visit was in October - chest pain continues to be chronic but he felt like he was doing ok overall.   Comes in today. Here alone. He has stopped his Lipitor - read "too much" off the Internet. Not really clear as to why he stopped. Did not sound like he was having problems with it. No real chest pain. Breathing ok. Not smoking - he used Chantix - worked the second time. No problems with his DAPT therapy. Does need lab today. Overall, he feels like he is doing ok - no real concerns.   Past Medical History:  Diagnosis Date  . Acid reflux   . Anxiety   . CAD S/P percutaneous coronary angioplasty 01/06/2015   a. NSTEMI - Xience DES 3.5 x 33 p-mLAD; 60% D1, 50% mRCA   b. 95%  ost D2 treated with suboptimal PTCA with 55% residual stenosis, 55% mid LAD, 55% mid LAD ISR treated with PTCA, 99% ost 1st septal perforator, 50% mid RCA  . Depression   . Hemorrhoid   . History of kidney stones   . NSTEMI (non-ST elevated myocardial infarction) (HCC) 12/2014  . Peripheral vascular disease [facility](HCC)     Past Surgical History:  Procedure Laterality Date  . ABDOMINAL AORTAGRAM N/A 12/18/2012   Procedure: ABDOMINAL AORTAGRAM;  Surgeon: Fransisco HertzBrian L Chen, MD;  [facility];  Service: Cardiovascular;  Laterality: N/A;  . AORTA - BILATERAL FEMORAL ARTERY BYPASS GRAFT N/A 01/07/2013   Procedure: AORTA BIFEMORAL  BYPASS GRAFT;  Surgeon: Fransisco HertzBrian L Chen, MD;  [facility];  Service: Vascular;  Laterality: N/A;  . CARDIAC CATHETERIZATION N/A 01/06/2015   Procedure: Left Heart Cath and Coronary Angiography;  Surgeon: Kathleene Hazelhristopher D McAlhany, MD;  [facility];  Service: Cardiovascular;  Laterality: N/A;  . CARDIAC CATHETERIZATION N/A 01/06/2015   Procedure: Coronary Stent Intervention;  Surgeon: Kathleene Hazelhristopher D McAlhany, MD;  Location: [facility] INVASIVE CV LAB;  Service: Cardiovascular: Xience DES 3.5 mm x 33 mm p-mLAD  . CARDIAC CATHETERIZATION N/A 07/02/2015   Procedure: Left Heart Cath and Coronary Angiography;  Surgeon: Marykay Lexavid W Harding, MD;  [facility];  Service: Cardiovascular;  Laterality: N/A;  . CARDIAC CATHETERIZATION N/A 07/02/2015   Procedure: Coronary Stent Intervention;  Surgeon: Marykay Lexavid W Harding, MD;  [facility];  Service: Cardiovascular;  Laterality: N/A;  . ELBOW SURGERY Bilateral   . KIDNEY STONE SURGERY     x2     Medications: Current Meds  Medication Sig  . aspirin EC 81 MG tablet Take 81 mg by mouth daily.  [name]. ibuprofen (ADVIL,MOTRIN) 200 MG tablet Take 400-600 mg by mouth every 6 (six) hours as needed for headache (pain).  . metoprolol tartrate (LOPRESSOR) 25 MG tablet Take 0.5 tablets (12.5 mg total) by mouth 2 (two) times daily.  . nitroGLYCERIN (NITROSTAT) 0.4 MG SL tablet Place 1 tablet (0.4 mg total) under the tongue every 5 (five) minutes as needed for chest pain (x 3 doses).  [name]. omeprazole (PRILOSEC) 20 MG capsule Take 1 capsule (20 mg total) by mouth daily.  . sertraline (ZOLOFT) 100 MG tablet TAKE 1 TABLET (100 MG TOTAL) BY MOUTH DAILY.  . sildenafil (REVATIO) 20 MG tablet Take 60 mg by mouth as needed.   . ticagrelor (BRILINTA) 90 MG TABS tablet Take 1 tablet (90 mg total) by mouth 2 (two) times daily.  . [DISCONTINUED] BRILINTA 90 MG TABS tablet TAKE 1 TABLET BY MOUTH TWICE A DAY  . [DISCONTINUED] metoprolol tartrate (LOPRESSOR) 25 MG  tablet Take 0.5 tablets (12.5 mg total) by mouth 2 (two) times daily. Please keep 01/20/18 appointment for further refills  . [DISCONTINUED] nitroGLYCERIN (NITROSTAT) 0.4 MG SL tablet Place 1 tablet (0.4 mg total) under the tongue every 5 (five) minutes as needed for chest pain (x 3 doses).  . [DISCONTINUED] omeprazole (PRILOSEC) 20 MG capsule Take 1 capsule (20 mg total) by mouth daily.     Allergies: Allergies  Allergen Reactions  . Simvastatin Nausea Only    Stomach aches    Social History: The patient  reports that he quit smoking about 20 months ago. His smoking use included cigarettes and e-cigarettes. He has a 30.00 pack-year smoking history. He has never used smokeless tobacco. He reports that he does not drink alcohol or use drugs.  Family History: The patient's family history includes Cancer in his paternal grandfather; Heart attack in his father and mother; Heart disease in his father, maternal grandmother, and mother; Hyperlipidemia in his father and mother.   Review of Systems: Please see the history of present illness.   Otherwise, the review of systems is positive for none.   All other systems are reviewed and negative.   Physical Exam: VS:  BP 100/68   Pulse (!) 53   Ht 5\' 9"  (1.753 m)   Wt 182 lb (82.6 kg)   BMI 26.88 kg/m  .  BMI Body mass index is 26.88 kg/m.  Wt Readings from Last 3 Encounters:  01/20/18 182 lb (82.6 kg)  11/26/16 171 lb 12.8 oz (77.9 kg)  05/22/16 171 lb 1.9 oz (77.6 kg)    General: Pleasant. Alert and in no acute distress.   HEENT: Normal.  Neck: Supple, no JVD, carotid bruits, or masses noted.  Cardiac: Regular rate and rhythm. No murmurs, rubs, or gallops. No edema.  Respiratory:  Lungs are clear to auscultation bilaterally with normal work of breathing.  GI: Soft and nontender.  MS: No deformity or atrophy. Gait and ROM intact.  Skin: Warm and dry. Color is normal.  Neuro:  Strength and sensation are intact and no gross focal  deficits noted.  Psych: Alert, appropriate and with normal affect.   LABORATORY DATA:  EKG:  EKG is ordered today. This demonstrates sinus bradycardia. HR is 53  Lab Results  Component Value Date   WBC 7.2 11/26/2016   HGB 16.5 11/26/2016   HCT 46.8 11/26/2016   PLT 198 11/26/2016   GLUCOSE 94 11/26/2016   CHOL 197 11/26/2016   TRIG 124 11/26/2016   HDL 44 11/26/2016   LDLCALC 128 (H) 11/26/2016   ALT 17 11/26/2016   AST 17 11/26/2016   NA 138 11/26/2016   K 5.0 11/26/2016   CL 100 11/26/2016   CREATININE 1.29 (H) 11/26/2016   BUN 12 11/26/2016   CO2 25 11/26/2016   TSH 1.530 07/03/2015   INR 1.08 07/03/2015   INR 1.08 07/03/2015   HGBA1C 5.7 (H) 07/03/2015     BNP (last 3 results) No results for input(s): BNP in the last 8760 hours.  ProBNP (last 3 results) No results for input(s): PROBNP in the last 8760 hours.   Other Studies Reviewed Today:  Cath 07/03/2015    Conclusion   1. Ost 2nd Diag to 2nd Diag lesion, 95% stenosed. Post PTCA intervention, there is a 55% residual stenosis. 2. Mid LAD lesion, 55% stenosed. Involved in the bifurcation lesion. This is in-stent restenosis. Post intervention - PTCA only, there is a 5% residual stenosis. 3. Ost 1st Sept lesion, 99% stenosed. 4. Mid RCA lesion, 50% stenosed. Dist RCA lesion, 30% stenosed. 5. Prox Cx to Mid Cx lesion, 25% stenosed. 6. Elevated LVEDP  For his lateral ST elevations, likely culprit lesion is the jailed D2 vessel with a 95% stenosis.  Ultimately the PTCA result on D2 was suboptimal, but after 2 angioplasty balloons ruptured at this lesion, I felt it safest to abort any further attempts. There was also given plaque shift into the stented segment of the LAD. This was treated with post-dilation balloon PTCA. Unfortunately in part of this process, the jailed septal perforator trunk with a 99% stenosis that had TIMI 2 flow initially and TIMI 1 flow at the end of the case. The patient was still  having 5 out of 10 anginal pain as  well as GERD symptoms at the end of the case. He was given 4 mg of morphine, 20 mg Pepcid IV, and the nitroglycerin glycerin infusion was increased. Upon arrival to the CCU room, the patient was relatively pain-free and feeling better. I suspect his pain was coming from the jailed septal perforator trunk that had TIMI 1 flow at the end of the case, likely related to increased LVEDP from systemic hypertension leading to high wall stress.   Plan:  Admit to CCU for post PTCA/PCI care.  TR band removal per protocol  Aggrastat infusion for 6 hours  Consider IV heparin for 24 hours, starting 8 hours after sheath removal  Nitrogen infusion for afterload reduction  1 dose of IV Lasix to be administered upon arrival CCU. - 20 mg  When necessary morphine for pain control  Based on this presentation, I would prefer not to fast track, however if he looks stable by the next day, to consider. He is already on Brilinta, but would need care management assistance to see if we can figure out a way to get his medications on discharge. Apparently has not been taking a lot of his medications besides Brilinta.  He needs to stop Smoking. Dr. Herbie Baltimore       Echo Study Conclusions 06/2015  - Left ventricle: The cavity size was normal. Systolic function was  normal. The estimated ejection fraction was in the range of 60%  to 65%. Wall motion was normal; there were no regional wall  motion abnormalities. Left ventricular diastolic function  parameters were normal. - Aortic valve: There was no regurgitation. - Mitral valve: Structurally normal valve. - Right ventricle: The cavity size was normal. Wall thickness was  normal. Systolic function was normal. - Right atrium: The atrium was normal in size. - Tricuspid valve: There was trivial regurgitation. - Pulmonary arteries: Systolic pressure was within the normal  range. - Inferior vena cava: The vessel was  normal in size. - Pericardium, extracardiac: There was no pericardial effusion.  Impressions:  - No regional wall motion abnormalities were seen on Definity  images.  Assessment/Plan:  1. CAD with history of recurrent angina - s/p cardiac cath 06/2015 with angioplasty to 1st diagonal - has had "jailed" septal perforator - angioplasty result not ideal. He remains on DAPT - no problems noted. No real complaints of chest pain. Needs CV risk factor modification.   2. HLD - restarting Lipitor - checking labs today.   3. Tobacco abuse - denies smoking.   4. GERD - he remains on PPI therapy. This was refilled for him today.   5. PVD - no claudication noted.   6. Anxiety/depression - per psyche/PCP  Current medicines are reviewed with the patient today.  The patient does not have concerns regarding medicines other than what has been noted above.  The following changes have been made:  See above.  Labs/ tests ordered today include:    Orders Placed This Encounter  Procedures  . Basic metabolic panel  . CBC  . Hepatic function panel  . Lipid panel  . EKG 12-Lead     Disposition:   FU with me in one year.    Patient is agreeable to this plan and will call if any problems develop in the interim.   SignedNorma Fredrickson, NP  01/20/2018 10:46 AM  [facility] [address] [place], [place]  [address] Phone: [phone] Fax: [phone]

## 2018-01-20 NOTE — Patient Instructions (Addendum)
We will be checking the following labs today - BMET, CBC, HPF and lipids  If you have labs (blood work) drawn today and your tests are completely normal, you will receive your results only by: [name]. MyChart Message (if you have MyChart) OR . A paper copy in the mail If you have any lab test that is abnormal or we need to change your treatment, we will call you to review the results.   Medication Instructions:    Continue with your current medicines.   I sent in all your refills today   If you need a refill on your cardiac medications before your next appointment, please call your pharmacy.     Testing/Procedures To Be Arranged:  N/A  Follow-Up:   See me in one year. 01/19/2019 @ 10:30 AM Tommy FredricksonLORI Jameca Malone , NP    At [facility], you and your health needs are our priority.  As part of our continuing mission to provide you with exceptional heart care, we have created designated Provider Care Teams.  These Care Teams include your primary Cardiologist (physician) and Advanced Practice Providers (APPs -  Physician Assistants and Nurse Practitioners) who all work together to provide you with the care you need, when you need it.  Special Instructions:  . None  Call the [facility] office at [phone] if you have any questions, problems or concerns.

## 2018-01-21 ENCOUNTER — Other Ambulatory Visit: Payer: Self-pay | Admitting: *Deleted

## 2018-01-21 DIAGNOSIS — Z79899 Other long term (current) drug therapy: Secondary | ICD-10-CM

## 2018-01-21 DIAGNOSIS — E7800 Pure hypercholesterolemia, unspecified: Secondary | ICD-10-CM

## 2018-01-21 LAB — BASIC METABOLIC PANEL
BUN/Creatinine Ratio: 11 (ref 9–20)
BUN: 12 mg/dL (ref 6–24)
CO2: 22 mmol/L (ref 20–29)
Calcium: 8.6 mg/dL — ABNORMAL LOW (ref 8.7–10.2)
Chloride: 107 mmol/L — ABNORMAL HIGH (ref 96–106)
Creatinine, Ser: 1.09 mg/dL (ref 0.76–1.27)
GFR calc Af Amer: 90 mL/min/{1.73_m2} (ref >59)
GFR calc non Af Amer: 78 mL/min/{1.73_m2} (ref >59)
Glucose: 101 mg/dL — ABNORMAL HIGH (ref 65–99)
Potassium: 5 mmol/L (ref 3.5–5.2)
Sodium: 141 mmol/L (ref 134–144)

## 2018-01-21 LAB — CBC
Hematocrit: 45.9 % (ref 37.5–51.0)
Hemoglobin: 15 g/dL (ref 13.0–17.7)
MCH: 29.8 pg (ref 26.6–33.0)
MCHC: 32.7 g/dL (ref 31.5–35.7)
MCV: 91 fL (ref 79–97)
Platelets: 254 10*3/uL (ref 150–450)
RBC: 5.04 x10E6/uL (ref 4.14–5.80)
RDW: 12.8 % (ref 12.3–15.4)
WBC: 8.5 10*3/uL (ref 3.4–10.8)

## 2018-01-21 LAB — HEPATIC FUNCTION PANEL
ALT: 25 IU/L (ref 0–44)
AST: 25 IU/L (ref 0–40)
Albumin: 3.3 g/dL — ABNORMAL LOW (ref 3.5–5.5)
Alkaline Phosphatase: 69 IU/L (ref 39–117)
Bilirubin Total: <0.2 mg/dL (ref 0.0–1.2)
Bilirubin, Direct: 0.06 mg/dL (ref 0.00–0.40)
Total Protein: 5.1 g/dL — ABNORMAL LOW (ref 6.0–8.5)

## 2018-01-21 LAB — LIPID PANEL
Chol/HDL Ratio: 5.9 ratio — ABNORMAL HIGH (ref 0.0–5.0)
Cholesterol, Total: 194 mg/dL (ref 100–199)
HDL: 33 mg/dL — ABNORMAL LOW (ref >39)
LDL Calculated: 120 mg/dL — ABNORMAL HIGH (ref 0–99)
Triglycerides: 204 mg/dL — ABNORMAL HIGH (ref 0–149)
VLDL Cholesterol Cal: 41 mg/dL — ABNORMAL HIGH (ref 5–40)

## 2018-01-27 ENCOUNTER — Telehealth: Payer: Self-pay

## 2018-01-27 DIAGNOSIS — E785 Hyperlipidemia, unspecified: Secondary | ICD-10-CM

## 2018-01-27 NOTE — Telephone Encounter (Signed)
Notes recorded by Sigurd Sosapp, Miciah Shealy, RN on 01/27/2018 at 10:08 AM EST The patient has been notified of the result and verbalized understanding. Labs scheduled 03/28/18 (Lipids/LFTs). All questions (if any) were answered. Sigurd SosMichael Jobany Montellano, RN 01/27/2018 10:08 AM

## 2018-01-27 NOTE — Telephone Encounter (Signed)
Notes recorded by Sigurd Sosapp, Autry Prust, RN on 01/27/2018 at 9:19 AM EST [id number] to set up labs 12/30 ------

## 2018-01-27 NOTE — Telephone Encounter (Signed)
-----   Message from Rosalio MacadamiaLori C Gerhardt, NP sent at 01/21/2018  7:19 AM EST ----- Ok to report. Labs are stable - lipids were not at goal - statin was restarted at his OV yesterday - otherwise, would continue on current regimen. Would like lipids and LFTs in 8 weeks please.

## 2018-01-27 NOTE — Telephone Encounter (Signed)
-----   Message from Lori C Gerhardt, NP sent at 01/21/2018  7:19 AM EST ----- Ok to report. Labs are stable - lipids were not at goal - statin was restarted at his OV yesterday - otherwise, would continue on current regimen. Would like lipids and LFTs in 8 weeks please. 

## 2018-01-27 NOTE — Telephone Encounter (Signed)
Follow up:   Patient returning a call back.  

## 2018-02-04 DIAGNOSIS — J40 Bronchitis, not specified as acute or chronic: Secondary | ICD-10-CM | POA: Diagnosis not present

## 2018-02-20 ENCOUNTER — Other Ambulatory Visit: Payer: Self-pay | Admitting: Nurse Practitioner

## 2018-03-07 DIAGNOSIS — E7800 Pure hypercholesterolemia, unspecified: Secondary | ICD-10-CM | POA: Diagnosis not present

## 2018-03-07 DIAGNOSIS — I2510 Atherosclerotic heart disease of native coronary artery without angina pectoris: Secondary | ICD-10-CM | POA: Diagnosis not present

## 2018-03-07 DIAGNOSIS — Z125 Encounter for screening for malignant neoplasm of prostate: Secondary | ICD-10-CM | POA: Diagnosis not present

## 2018-03-07 DIAGNOSIS — F3341 Major depressive disorder, recurrent, in partial remission: Secondary | ICD-10-CM | POA: Diagnosis not present

## 2018-03-07 DIAGNOSIS — Z23 Encounter for immunization: Secondary | ICD-10-CM | POA: Diagnosis not present

## 2018-03-28 ENCOUNTER — Other Ambulatory Visit: Payer: BLUE CROSS/BLUE SHIELD

## 2018-04-03 ENCOUNTER — Other Ambulatory Visit: Payer: Self-pay | Admitting: Nurse Practitioner

## 2018-04-03 NOTE — Telephone Encounter (Signed)
I had asked him to get further refills from psyche - can we defer this to them?

## 2018-04-03 NOTE — Telephone Encounter (Signed)
Can we see if his psyche MD will prescribe this?

## 2018-04-03 NOTE — Telephone Encounter (Signed)
Pt's pharmacy is requesting a refill on sertraline (Zoloft). Would Tommy Malone like to refill this medication? Please address

## 2018-04-09 ENCOUNTER — Other Ambulatory Visit: Payer: Self-pay | Admitting: Nurse Practitioner

## 2018-04-11 ENCOUNTER — Other Ambulatory Visit: Payer: Self-pay | Admitting: Nurse Practitioner

## 2018-04-11 NOTE — Telephone Encounter (Signed)
See phone note from 04/09/2018. Called patient to make him aware of msg from 04/09/2018 phone note but didn't get an answer.

## 2018-04-11 NOTE — Telephone Encounter (Signed)
Attempted again to reach patient but was unsuccessful. 

## 2018-04-11 NOTE — Telephone Encounter (Signed)
 *  STAT* If patient is at the pharmacy, call can be transferred to refill team.   1. Which medications need to be refilled? (please list name of each medication and dose if known)  sertraline (ZOLOFT) 100 MG tablet  2. Which pharmacy/location (including street and city if local pharmacy) is medication to be sent to? [facility] [address]  3. Do they need a 30 day or 90 day supply? 30

## 2018-04-15 ENCOUNTER — Other Ambulatory Visit: Payer: Self-pay

## 2018-04-15 MED ORDER — SERTRALINE HCL 100 MG PO TABS: 100.0000 mg | ORAL_TABLET | Freq: Every day | ORAL | 8 refills | Status: DC

## 2018-05-08 ENCOUNTER — Other Ambulatory Visit: Payer: Self-pay | Admitting: Nurse Practitioner

## 2018-05-14 ENCOUNTER — Other Ambulatory Visit: Payer: Self-pay | Admitting: Nurse Practitioner

## 2019-01-14 ENCOUNTER — Other Ambulatory Visit: Payer: Self-pay | Admitting: Nurse Practitioner

## 2019-01-14 NOTE — Progress Notes (Deleted)
CARDIOLOGY OFFICE NOTE  Date:  01/14/2019    Tommy Malone. Date of Birth: [birth year] Medical Record #[id number]  PCP:  Renford Dills, MD  Cardiologist:  Tyrone Sage & ***    No chief complaint on file.   History of Present Illness: Tommy Malone. is a 53 y.o. male who presents today for a *** Seen for Dr. Tenny Craw. Typically follows with me.   He has a historyof tobacco abuse ongoing, untreated hyperlipidemia, lower extremity PAD s/p aortobifemoral bypass graft by Dr. Imogene Burn in 12/2012, and bilateral carotid artery disease . Myoview stress test in 2014 was normal without ischemia, LVEF was normal 57%. He also has bilateral carotid artery disease with evidence of left subclavian stenosis. Carotid Doppler performed in January 2015 showed 40% right ICA stenosis and 40-59% left ICA stenosis. There was no documentation of the degree of his left subclavian stenosis.   He presented with CP and NSTEMI in 12/2014 and Cardiac catheterizationshowed 99% proximal LAD lesion treated with DES (Xience 3.5 x 33 mm DES), 60% D2 stenosis, 70% proximal to mid LAD lesion covered with same stent, 50% mid RCA lesion, 30% distal RCA lesion, EF 45-50%. Postprocedure, he was enrolled in [place] trial and placed on aspirin and Brilinta. Echocardiogram was obtained on 12/9 which showed EF 55-60%.   In June of 2017 he had sudden onset of SSCP with no radiation but associated with severe diaphoresis and SOB. Pain was identical to his prior MI. He took a SL NTG without any improvement and called EMS. He was given ASA and Heparin bolus 5000 units. EKG showed ST elevated in V1 and V2, aVL and I with reciprocal ST depression in the inferior leads and code STEMI was called.    Patient underwent cardiac catheterization on 07/03/2015 95% ost D2 treated with suboptimal PTCA with 55% residual stenosis, 55% mid LAD, 55% mid LAD ISR treated with PTCA, 99% ost 1st septal perforator, 50% mid RCA. Echocardiogram obtained  on 07/04/2015 showed EF 60-65%. Unfortunately during the cardiac catheterization septal perforator was jailed, post cath, his troponin trended up to 8.32. He did well clinically.      I then saw him for his post hospital visit - hadnot been back here since until April of 2018- needed to go back to work for money. Still smoking. No chest pain. His anginal equivalent is very GI in nature.I saw him in April of 2018l - he was doing ok - had gotten insurance and was able to get Chantix and had stopped smoking.Last visit was in October - chest pain continues to be chronic but he felt like he was doing ok overall.   Comes in today. Here alone.He has stopped his Lipitor - read "too much" off the Internet. Not really clear as to why he stopped. Did not sound like he was having problems with it. No real chest pain. Breathing ok. Not smoking - he used Chantix - worked the second time. No problems with his DAPT therapy. Does need lab today. Overall, he feels like he is doing ok - no real concerns.   The patient {does/does not:200015} have symptoms concerning for COVID-19 infection (fever, chills, cough, or new shortness of breath).   Comes in today. Here with   Past Medical History:  Diagnosis Date  . Acid reflux   . Anxiety   . CAD S/P percutaneous coronary angioplasty 01/06/2015   a. NSTEMI - Xience DES 3.5 x 33 p-mLAD; 60% D1, 50% mRCA  b. 95% ost D2 treated with suboptimal PTCA with 55% residual stenosis, 55% mid LAD, 55% mid LAD ISR treated with PTCA, 99% ost 1st septal perforator, 50% mid RCA  . Depression   . Hemorrhoid   . History of kidney stones   . NSTEMI (non-ST elevated myocardial infarction) (HCC) 12/2014  . Peripheral vascular disease [facility])     Past Surgical History:  Procedure Laterality Date  . ABDOMINAL AORTAGRAM N/A 12/18/2012   Procedure: ABDOMINAL AORTAGRAM;  Surgeon: Fransisco Hertz, MD;  Location: [facility] CATH LAB;  Service: Cardiovascular;  Laterality: N/A;  . AORTA -  BILATERAL FEMORAL ARTERY BYPASS GRAFT N/A 01/07/2013   Procedure: AORTA BIFEMORAL BYPASS GRAFT;  Surgeon: Fransisco Hertz, MD;  Location: [facility] OR;  Service: Vascular;  Laterality: N/A;  . CARDIAC CATHETERIZATION N/A 01/06/2015   Procedure: Left Heart Cath and Coronary Angiography;  Surgeon: Kathleene Hazel, MD;  Location: [facility] INVASIVE CV LAB;  Service: Cardiovascular;  Laterality: N/A;  . CARDIAC CATHETERIZATION N/A 01/06/2015   Procedure: Coronary Stent Intervention;  Surgeon: Kathleene Hazel, MD;  Location: [facility] INVASIVE CV LAB;  Service: Cardiovascular: Xience DES 3.5 mm x 33 mm p-mLAD  . CARDIAC CATHETERIZATION N/A 07/02/2015   Procedure: Left Heart Cath and Coronary Angiography;  Surgeon: Marykay Lex, MD;  Location: [facility] INVASIVE CV LAB;  Service: Cardiovascular;  Laterality: N/A;  . CARDIAC CATHETERIZATION N/A 07/02/2015   Procedure: Coronary Stent Intervention;  Surgeon: Marykay Lex, MD;  Location: [facility] INVASIVE CV LAB;  Service: Cardiovascular;  Laterality: N/A;  . ELBOW SURGERY Bilateral   . KIDNEY STONE SURGERY     x2     Medications: No outpatient medications have been marked as taking for the 01/19/19 encounter (Appointment) with Rosalio Macadamia, NP.     Allergies: Allergies  Allergen Reactions  . Simvastatin Nausea Only    Stomach aches    Social History: The patient  reports that he quit smoking about 2 years ago. His smoking use included cigarettes and e-cigarettes. He has a 30.00 pack-year smoking history. He has never used smokeless tobacco. He reports that he does not drink alcohol or use drugs.   Family History: The patient's ***family history includes Cancer in his paternal grandfather; Heart attack in his father and mother; Heart disease in his father, maternal grandmother, and mother; Hyperlipidemia in his father and mother.   Review of Systems: Please see the history of present illness.   All other systems are reviewed and negative.   Physical Exam:  VS:  There were no vitals taken for this visit. [name]  BMI There is no height or weight on file to calculate BMI.  Wt Readings from Last 3 Encounters:  01/20/18 182 lb (82.6 kg)  11/26/16 171 lb 12.8 oz (77.9 kg)  05/22/16 171 lb 1.9 oz (77.6 kg)    General: Pleasant. Well developed, well nourished and in no acute distress.   HEENT: Normal.  Neck: Supple, no JVD, carotid bruits, or masses noted.  Cardiac: ***Regular rate and rhythm. No murmurs, rubs, or gallops. No edema.  Respiratory:  Lungs are clear to auscultation bilaterally with normal work of breathing.  GI: Soft and nontender.  MS: No deformity or atrophy. Gait and ROM intact.  Skin: Warm and dry. Color is normal.  Neuro:  Strength and sensation are intact and no gross focal deficits noted.  Psych: Alert, appropriate and with normal affect.   LABORATORY DATA:  EKG:  EKG {ACTION; IS/IS XBJ:[id number]} ordered today. This  demonstrates ***.  Lab Results  Component Value Date   WBC 8.5 01/20/2018   HGB 15.0 01/20/2018   HCT 45.9 01/20/2018   PLT 254 01/20/2018   GLUCOSE 101 (H) 01/20/2018   CHOL 194 01/20/2018   TRIG 204 (H) 01/20/2018   HDL 33 (L) 01/20/2018   LDLCALC 120 (H) 01/20/2018   ALT 25 01/20/2018   AST 25 01/20/2018   NA 141 01/20/2018   K 5.0 01/20/2018   CL 107 (H) 01/20/2018   CREATININE 1.09 01/20/2018   BUN 12 01/20/2018   CO2 22 01/20/2018   TSH 1.530 07/03/2015   INR 1.08 07/03/2015   INR 1.08 07/03/2015   HGBA1C 5.7 (H) 07/03/2015     BNP (last 3 results) No results for input(s): BNP in the last 8760 hours.  ProBNP (last 3 results) No results for input(s): PROBNP in the last 8760 hours.   Other Studies Reviewed Today:   Cath 07/03/2015    Conclusion   1. Ost 2nd Diag to 2nd Diag lesion, 95% stenosed. Post PTCA intervention, there is a 55% residual stenosis. 2. Mid LAD lesion, 55% stenosed. Involved in the bifurcation lesion. This is in-stent restenosis. Post intervention - PTCA  only, there is a 5% residual stenosis. 3. Ost 1st Sept lesion, 99% stenosed. 4. Mid RCA lesion, 50% stenosed. Dist RCA lesion, 30% stenosed. 5. Prox Cx to Mid Cx lesion, 25% stenosed. 6. Elevated LVEDP  For his lateral ST elevations, likely culprit lesion is the jailed D2 vessel with a 95% stenosis.  Ultimately the PTCA result on D2 was suboptimal, but after 2 angioplasty balloons ruptured at this lesion, I felt it safest to abort any further attempts. There was also given plaque shift into the stented segment of the LAD. This was treated with post-dilation balloon PTCA. Unfortunately in part of this process, the jailed septal perforator trunk with a 99% stenosis that had TIMI 2 flow initially and TIMI 1 flow at the end of the case. The patient was still having 5 out of 10 anginal pain as well as GERD symptoms at the end of the case. He was given 4 mg of morphine, 20 mg Pepcid IV, and the nitroglycerin glycerin infusion was increased. Upon arrival to the CCU room, the patient was relatively pain-free and feeling better. I suspect his pain was coming from the jailed septal perforator trunk that had TIMI 1 flow at the end of the case, likely related to increased LVEDP from systemic hypertension leading to high wall stress.   Plan:  Admit to CCU for post PTCA/PCI care.  TR band removal per protocol  Aggrastat infusion for 6 hours  Consider IV heparin for 24 hours, starting 8 hours after sheath removal  Nitrogen infusion for afterload reduction  1 dose of IV Lasix to be administered upon arrival CCU. - 20 mg  When necessary morphine for pain control  Based on this presentation, I would prefer not to fast track, however if he looks stable by the next day, to consider. He is already on Brilinta, but would need care management assistance to see if we can figure out a way to get his medications on discharge. Apparently has not been taking a lot of his medications besides Brilinta.  He  needs to stop Smoking. Dr. Herbie BaltimoreHarding       Echo Study Conclusions 06/2015  - Left ventricle: The cavity size was normal. Systolic function was  normal. The estimated ejection fraction was in the range of 60%  to 65%. Wall motion was normal; there were no regional wall  motion abnormalities. Left ventricular diastolic function  parameters were normal. - Aortic valve: There was no regurgitation. - Mitral valve: Structurally normal valve. - Right ventricle: The cavity size was normal. Wall thickness was  normal. Systolic function was normal. - Right atrium: The atrium was normal in size. - Tricuspid valve: There was trivial regurgitation. - Pulmonary arteries: Systolic pressure was within the normal  range. - Inferior vena cava: The vessel was normal in size. - Pericardium, extracardiac: There was no pericardial effusion.  Impressions:  - No regional wall motion abnormalities were seen on Definity  images.  Assessment/Plan:  1. CAD with history of recurrent angina - s/p cardiac cath 06/2015 with angioplasty to 1st diagonal - has had "jailed" septal perforator - angioplasty result not ideal.He remains on DAPT - no problems noted. No real complaints of chest pain. Needs CV risk factor modification.   2. HLD - restarting Lipitor - checking labs today.   3. Tobacco abuse -denies smoking.   4. GERD -he remains on PPI therapy.This was refilled for him today.   5. PVD - no claudication noted.   6.Anxiety/depression - per psyche/PCP  . COVID-19 Education: The signs and symptoms of COVID-19 were discussed with the patient and how to seek care for testing (follow up with PCP or arrange E-visit).  The importance of social distancing, staying at home, hand hygiene and wearing a mask when out in public were discussed today.  Current medicines are reviewed with the patient today.  The patient does not have concerns regarding medicines other than what has been noted  above.  The following changes have been made:  See above.  Labs/ tests ordered today include:   No orders of the defined types were placed in this encounter.    Disposition:   FU with *** in {gen number 0-27:253664} {Days to years:10300}.   Patient is agreeable to this plan and will call if any problems develop in the interim.   SignedTruitt Merle, NP  01/14/2019 7:58 AM  [facility] [address] [place], [place]  [address] Phone: [phone] Fax: [phone]

## 2019-01-15 ENCOUNTER — Other Ambulatory Visit: Payer: Self-pay | Admitting: Nurse Practitioner

## 2019-01-19 ENCOUNTER — Ambulatory Visit: Payer: BLUE CROSS/BLUE SHIELD | Admitting: Nurse Practitioner

## 2019-02-02 NOTE — Progress Notes (Deleted)
Cardiology Office Note   Date:  02/02/2019   ID:  Tommy Capers., DOB September 27, 1965, MRN [id number]  PCP:  Renford Dills, MD  Cardiologist: Dr. Dietrich Pates, MD  No chief complaint on file.     Malone of Present Illness: Tommy Scobie. is a 54 y.o. male who presents for close follow-up, seen for Dr. Tenny Craw.   Tommy Malone of ongoing tobacco use, untreated hyperlipidemia, lower extremity PAD s/p R to R bifemoral bypass grafting by Dr. Imogene Burn 2014 and bilateral carotid artery disease.  He underwent a Myoview stress test in 2014 which was normal without ischemia, LVEF was normal at 57%.  Carotid Doppler performed 01/2013 which showed 40% right ICA stenosis and 40 to 59% left ICA stenosis.  He had episodes of chest pain 12/2014 found to have NSTEMI with subsequent cardiac catheterization showing 99% proximal LAD lesion treated with DES/PCI stenting, 60% D2 stenosis, 70% proximal to mid LAD lesion covered with the same stent, 50% mid RCA lesion, 30% distal RCA lesion and an LVEF of 45 to 50%.  Post procedural, he was enrolled in [place] trial and placed on ASA and Brilinta.  Follow-up echocardiogram showed EF of 55 to 60%.  In 2017, patient had recurrent substernal chest pain, similar to prior MI at which time he had a STEMI with [facility] 07/03/2015 showing 95% ost D2 treated with suboptimal PTCA with 55% residual stenosis, 55% mid LAD, 55% mid LAD ISR treated with PTCA, 99% ost 1st septal perforator, 50% mid RCA. Echocardiogram obtained on 07/04/2015 showed EF 60-65%.   He had a brief time lapse between 2017 and 2018 for which he was not seen in follow-up however was last seen 01/20/2018 for CAD follow-up.  At that time, he had stopped his Lipitor.  Had stopped smoking using Chantix.  No problems with DAPT therapy.  Overall is felt to be doing well from a CV standpoint.  Today,    1.  CAD s/p recurrent angina: -Last cardiac cath 06/2015 with angioplasty to with "jailed" septal perforator,  felt angioplasty result not ideal -Continues DAPT without concern -ASA, Brilinta, atorvastatin, metoprolol tartrate    2.  HLD: -Lipitor restarted at last office visit 12/2017 -Last LDL, 120 on 01/20/2018 -Repeat lipid panel today, LFTs -AST/ALT stable at last blood draw   3.  Malone of tobacco use: -Cessation strongly encouraged -Chantix?  4.  PVD: -No complaints of claudication  Past Medical Malone:  Diagnosis Date  . Acid reflux   . Anxiety   . CAD S/P percutaneous coronary angioplasty 01/06/2015   a. NSTEMI - Xience DES 3.5 x 33 p-mLAD; 60% D1, 50% mRCA   b. 95% ost D2 treated with suboptimal PTCA with 55% residual stenosis, 55% mid LAD, 55% mid LAD ISR treated with PTCA, 99% ost 1st septal perforator, 50% mid RCA  . Depression   . Hemorrhoid   . Malone of kidney stones   . NSTEMI (non-ST elevated myocardial infarction) (HCC) 12/2014  . Peripheral vascular disease [facility])     Past Surgical Malone:  Procedure Laterality Date  . ABDOMINAL AORTAGRAM N/A 12/18/2012   Procedure: ABDOMINAL AORTAGRAM;  Surgeon: Fransisco Hertz, MD;  Location: [facility] CATH LAB;  Service: Cardiovascular;  Laterality: N/A;  . AORTA - BILATERAL FEMORAL ARTERY BYPASS GRAFT N/A 01/07/2013   Procedure: AORTA BIFEMORAL BYPASS GRAFT;  Surgeon: Fransisco Hertz, MD;  Location: [facility] OR;  Service: Vascular;  Laterality: N/A;  . CARDIAC CATHETERIZATION N/A  01/06/2015   Procedure: Left Heart Cath and Coronary Angiography;  Surgeon: Kathleene Hazel, MD;  Location: [facility] INVASIVE CV LAB;  Service: Cardiovascular;  Laterality: N/A;  . CARDIAC CATHETERIZATION N/A 01/06/2015   Procedure: Coronary Stent Intervention;  Surgeon: Kathleene Hazel, MD;  Location: [facility] INVASIVE CV LAB;  Service: Cardiovascular: Xience DES 3.5 mm x 33 mm p-mLAD  . CARDIAC CATHETERIZATION N/A 07/02/2015   Procedure: Left Heart Cath and Coronary Angiography;  Surgeon: Marykay Lex, MD;  Location: [facility] INVASIVE CV LAB;  Service: Cardiovascular;   Laterality: N/A;  . CARDIAC CATHETERIZATION N/A 07/02/2015   Procedure: Coronary Stent Intervention;  Surgeon: Marykay Lex, MD;  Location: [facility] INVASIVE CV LAB;  Service: Cardiovascular;  Laterality: N/A;  . ELBOW SURGERY Bilateral   . KIDNEY STONE SURGERY     x2     Current Outpatient Medications  Medication Sig Dispense Refill  . aspirin EC 81 MG tablet Take 81 mg by mouth daily.    [name] atorvastatin (LIPITOR) 80 MG tablet Take 1 tablet (80 mg total) by mouth daily. 90 tablet 3  . ibuprofen (ADVIL,MOTRIN) 200 MG tablet Take 400-600 mg by mouth every 6 (six) hours as needed for headache (pain).    . metoprolol tartrate (LOPRESSOR) 25 MG tablet Take 0.5 tablets (12.5 mg total) by mouth 2 (two) times daily. 90 tablet 3  . nitroGLYCERIN (NITROSTAT) 0.4 MG SL tablet Place 1 tablet (0.4 mg total) under the tongue every 5 (five) minutes as needed for chest pain (x 3 doses). 25 tablet 2  . omeprazole (PRILOSEC) 20 MG capsule TAKE 1 CAPSULE BY MOUTH EVERY DAY 90 capsule 3  . sertraline (ZOLOFT) 100 MG tablet TAKE 1 TABLET BY MOUTH EVERY DAY 30 tablet 0  . sildenafil (REVATIO) 20 MG tablet Take 60 mg by mouth as needed.     . ticagrelor (BRILINTA) 90 MG TABS tablet Take 1 tablet (90 mg total) by mouth 2 (two) times daily. 180 tablet 3   No current facility-administered medications for this visit.    Allergies:   Simvastatin    Social Malone:  The patient  reports that he quit smoking about 2 years ago. His smoking use included cigarettes and e-cigarettes. He has a 30.00 pack-year smoking Malone. He has never used smokeless tobacco. He reports that he does not drink alcohol or use drugs.   Family Malone:  The patient's family Malone includes Cancer in his paternal grandfather; Heart attack in his father and mother; Heart disease in his father, maternal grandmother, and mother; Hyperlipidemia in his father and mother.    ROS:  Please see the Malone of present illness. Otherwise, review of  systems are positive for none.   All other systems are reviewed and negative.    PHYSICAL EXAM: VS:  There were no vitals taken for this visit. , BMI There is no height or weight on file to calculate BMI.    General: Well developed, well nourished, NAD Skin: Warm, dry, intact  Head: Normocephalic, atraumatic, sclera non-icteric, no xanthomas, clear, moist mucus membranes. Neck: Negative for carotid bruits. No JVD Lungs:Clear to ausculation bilaterally. No wheezes, rales, or rhonchi. Breathing is unlabored. Cardiovascular: RRR with S1 S2. No murmurs, rubs, gallops, or LV heave appreciated. Abdomen: Soft, non-tender, non-distended with normoactive bowel sounds. No hepatomegaly, No rebound/guarding. No obvious abdominal masses. MSK: Strength and tone appear normal for age. 5/5 in all extremities Extremities: No edema. No clubbing or cyanosis. DP/PT pulses 2+ bilaterally Neuro: Alert and  oriented. No focal deficits. No facial asymmetry. MAE spontaneously. Psych: Responds to questions appropriately with normal affect.      EKG:  EKG {ACTION; IS/IS ZOX:[id number]} ordered today. The ekg ordered today demonstrates ***   Recent Labs: No results found for requested labs within last 8760 hours.    Lipid Panel    Component Value Date/Time   CHOL 194 01/20/2018 1103   TRIG 204 (H) 01/20/2018 1103   HDL 33 (L) 01/20/2018 1103   CHOLHDL 5.9 (H) 01/20/2018 1103   CHOLHDL 3.4 07/03/2015 0530   VLDL 14 07/03/2015 0530   LDLCALC 120 (H) 01/20/2018 1103      Wt Readings from Last 3 Encounters:  01/20/18 182 lb (82.6 kg)  11/26/16 171 lb 12.8 oz (77.9 kg)  05/22/16 171 lb 1.9 oz (77.6 kg)      Other studies Reviewed: Additional studies/ records that were reviewed today include:   Cath 07/03/2015    Conclusion   1. Ost 2nd Diag to 2nd Diag lesion, 95% stenosed. Post PTCA intervention, there is a 55% residual stenosis. 2. Mid LAD lesion, 55% stenosed. Involved in the bifurcation  lesion. This is in-stent restenosis. Post intervention - PTCA only, there is a 5% residual stenosis. 3. Ost 1st Sept lesion, 99% stenosed. 4. Mid RCA lesion, 50% stenosed. Dist RCA lesion, 30% stenosed. 5. Prox Cx to Mid Cx lesion, 25% stenosed. 6. Elevated LVEDP  For his lateral ST elevations, likely culprit lesion is the jailed D2 vessel with a 95% stenosis.  Ultimately the PTCA result on D2 was suboptimal, but after 2 angioplasty balloons ruptured at this lesion, I felt it safest to abort any further attempts. There was also given plaque shift into the stented segment of the LAD. This was treated with post-dilation balloon PTCA. Unfortunately in part of this process, the jailed septal perforator trunk with a 99% stenosis that had TIMI 2 flow initially and TIMI 1 flow at the end of the case. The patient was still having 5 out of 10 anginal pain as well as GERD symptoms at the end of the case. He was given 4 mg of morphine, 20 mg Pepcid IV, and the nitroglycerin glycerin infusion was increased. Upon arrival to the CCU room, the patient was relatively pain-free and feeling better. I suspect his pain was coming from the jailed septal perforator trunk that had TIMI 1 flow at the end of the case, likely related to increased LVEDP from systemic hypertension leading to high wall stress.   Plan:  Admit to CCU for post PTCA/PCI care.  TR band removal per protocol  Aggrastat infusion for 6 hours  Consider IV heparin for 24 hours, starting 8 hours after sheath removal  Nitrogen infusion for afterload reduction  1 dose of IV Lasix to be administered upon arrival CCU. - 20 mg  When necessary morphine for pain control  Based on this presentation, I would prefer not to fast track, however if he looks stable by the next day, to consider. He is already on Brilinta, but would need care management assistance to see if we can figure out a way to get his medications on discharge. Apparently has not  been taking a lot of his medications besides Brilinta.  He needs to stop Smoking. Dr. Herbie Baltimore       Echo Study Conclusions 06/2015  - Left ventricle: The cavity size was normal. Systolic function was  normal. The estimated ejection fraction was in the range of 60%  to 65%. Wall  motion was normal; there were no regional wall  motion abnormalities. Left ventricular diastolic function  parameters were normal. - Aortic valve: There was no regurgitation. - Mitral valve: Structurally normal valve. - Right ventricle: The cavity size was normal. Wall thickness was  normal. Systolic function was normal. - Right atrium: The atrium was normal in size. - Tricuspid valve: There was trivial regurgitation. - Pulmonary arteries: Systolic pressure was within the normal  range. - Inferior vena cava: The vessel was normal in size. - Pericardium, extracardiac: There was no pericardial effusion.  Impressions:  - No regional wall motion abnormalities were seen on Definity  images.   ASSESSMENT AND PLAN:  1.  ***   Current medicines are reviewed at length with the patient today.  The patient {ACTIONS; HAS/DOES NOT HAVE:19233} concerns regarding medicines.  The following changes have been made:  {PLAN; NO CHANGE:13088:s}  Labs/ tests ordered today include: *** No orders of the defined types were placed in this encounter.    Disposition:   FU with *** in {gen number 0-09:233007} {Days to years:10300}  Signed, Kathyrn Drown, NP  02/02/2019 8:11 AM    [facility] [place], [place], [place]  [address] Phone: [phone]; Fax: [phone]

## 2019-02-03 ENCOUNTER — Ambulatory Visit: Payer: Self-pay | Admitting: Cardiology

## 2019-02-06 ENCOUNTER — Other Ambulatory Visit: Payer: Self-pay | Admitting: Nurse Practitioner

## 2019-02-12 ENCOUNTER — Other Ambulatory Visit: Payer: Self-pay | Admitting: Nurse Practitioner

## 2019-02-19 NOTE — Progress Notes (Signed)
CARDIOLOGY OFFICE NOTE  Date:  02/23/2019    Tommy Malone. Date of Birth: [birth year] Medical Record #[id number]  PCP:  Renford Dills, MD  Cardiologist:  Belia Heman   Chief Complaint  Patient presents with  . Follow-up    Seen for Dr. Tenny Craw    History of Present Illness: Tommy Serviss. is a 54 y.o. male who presents today for a 13 month check.  Seen for Dr. Tenny Craw. Typically follows with me.   He has a history of ongoing tobacco use, HLD, PAD with prior aortobifemoral bypass grafting by Dr. Imogene Burn in 2014, and carotid disease with left subclavian stenosis. Normal Myoview in 2014.   He had NSTEMI in 12/2014 and cardiac cath showed 99% proximal LAD lesion treated with DES (Xience 3.5 x 33 mm DES), 60% D2 stenosis, 70% proximal to mid LAD lesion covered with same stent, 50% mid RCA lesion, 30% distal RCA lesion, EF 45-50%. Postprocedure, he was enrolled in [place] trial and placed on aspirin and Brilinta. Echocardiogram showed EF 55-60%.   He had STEMI in June of 2017 - cath at that time showed 95% ost D2 treated with suboptimal PTCA with 55% residual stenosis, 55% mid LAD, 55% mid LAD ISR treated with PTCA, 99% ost 1st septal perforator, 50% mid RCA. Echocardiogram showed EF 60-65%. Unfortunately during the cardiac catheterization septal perforator was jailed, post cath, his troponin trended up to 8.32. He did well clinically.  I have followed him since - he was able to stop smoking with Chantix. His angina equivalent is very GI in nature. He has tended to have some chronic chest pains. Has remained on DAPT as well as chronic PPI therapy.   Last seen here in December of 2019.    The patient does not have symptoms concerning for COVID-19 infection (fever, chills, cough, or new shortness of breath).   Comes in today. Here alone. Doing ok. Little short of breath at times - he is back smoking - due to stress. No real chest pain. Needs medicines refilled. He is working long days  at work. Bp is ok. Weight is stable. He has no concerns today.   Past Medical History:  Diagnosis Date  . Acid reflux   . Anxiety   . CAD S/P percutaneous coronary angioplasty 01/06/2015   a. NSTEMI - Xience DES 3.5 x 33 p-mLAD; 60% D1, 50% mRCA   b. 95% ost D2 treated with suboptimal PTCA with 55% residual stenosis, 55% mid LAD, 55% mid LAD ISR treated with PTCA, 99% ost 1st septal perforator, 50% mid RCA  . Depression   . Hemorrhoid   . History of kidney stones   . NSTEMI (non-ST elevated myocardial infarction) (HCC) 12/2014  . Peripheral vascular disease [facility])     Past Surgical History:  Procedure Laterality Date  . ABDOMINAL AORTAGRAM N/A 12/18/2012   Procedure: ABDOMINAL AORTAGRAM;  Surgeon: Fransisco Hertz, MD;  Location: [facility] CATH LAB;  Service: Cardiovascular;  Laterality: N/A;  . AORTA - BILATERAL FEMORAL ARTERY BYPASS GRAFT N/A 01/07/2013   Procedure: AORTA BIFEMORAL BYPASS GRAFT;  Surgeon: Fransisco Hertz, MD;  Location: [facility] OR;  Service: Vascular;  Laterality: N/A;  . CARDIAC CATHETERIZATION N/A 01/06/2015   Procedure: Left Heart Cath and Coronary Angiography;  Surgeon: Kathleene Hazel, MD;  Location: [facility] INVASIVE CV LAB;  Service: Cardiovascular;  Laterality: N/A;  . CARDIAC CATHETERIZATION N/A 01/06/2015   Procedure: Coronary Stent Intervention;  Surgeon: Kathleene Hazel,  MD;  Location: [facility] INVASIVE CV LAB;  Service: Cardiovascular: Xience DES 3.5 mm x 33 mm p-mLAD  . CARDIAC CATHETERIZATION N/A 07/02/2015   Procedure: Left Heart Cath and Coronary Angiography;  Surgeon: Marykay Lex, MD;  Location: [facility] INVASIVE CV LAB;  Service: Cardiovascular;  Laterality: N/A;  . CARDIAC CATHETERIZATION N/A 07/02/2015   Procedure: Coronary Stent Intervention;  Surgeon: Marykay Lex, MD;  Location: [facility] INVASIVE CV LAB;  Service: Cardiovascular;  Laterality: N/A;  . ELBOW SURGERY Bilateral   . KIDNEY STONE SURGERY     x2     Medications: Current Meds  Medication Sig  . aspirin EC  81 MG tablet Take 81 mg by mouth daily.  [name] atorvastatin (LIPITOR) 80 MG tablet Take 1 tablet (80 mg total) by mouth daily.  [name] ibuprofen (ADVIL,MOTRIN) 200 MG tablet Take 400-600 mg by mouth every 6 (six) hours as needed for headache (pain).  . metoprolol tartrate (LOPRESSOR) 25 MG tablet Take 0.5 tablets (12.5 mg total) by mouth 2 (two) times daily.  . nitroGLYCERIN (NITROSTAT) 0.4 MG SL tablet Place 1 tablet (0.4 mg total) under the tongue every 5 (five) minutes as needed for chest pain (x 3 doses).  [name] omeprazole (PRILOSEC) 20 MG capsule TAKE 1 CAPSULE BY MOUTH EVERY DAY  . sertraline (ZOLOFT) 100 MG tablet Take 1 tablet (100 mg total) by mouth daily.  . sildenafil (REVATIO) 20 MG tablet Take 60 mg by mouth as needed.   . ticagrelor (BRILINTA) 90 MG TABS tablet Take 1 tablet (90 mg total) by mouth 2 (two) times daily. Please call and schedule an appt for further refills 1st attempt  . [DISCONTINUED] atorvastatin (LIPITOR) 80 MG tablet Take 1 tablet (80 mg total) by mouth daily.  . [DISCONTINUED] metoprolol tartrate (LOPRESSOR) 25 MG tablet Take 0.5 tablets (12.5 mg total) by mouth 2 (two) times daily.  . [DISCONTINUED] nitroGLYCERIN (NITROSTAT) 0.4 MG SL tablet Place 1 tablet (0.4 mg total) under the tongue every 5 (five) minutes as needed for chest pain (x 3 doses).  . [DISCONTINUED] omeprazole (PRILOSEC) 20 MG capsule TAKE 1 CAPSULE BY MOUTH EVERY DAY  . [DISCONTINUED] sertraline (ZOLOFT) 100 MG tablet TAKE 1 TABLET BY MOUTH EVERY DAY  . [DISCONTINUED] ticagrelor (BRILINTA) 90 MG TABS tablet Take 1 tablet (90 mg total) by mouth 2 (two) times daily. Please call and schedule an appt for further refills 1st attempt     Allergies: Allergies  Allergen Reactions  . Simvastatin Nausea Only    Stomach aches    Social History: The patient  reports that he has quit smoking. His smoking use included cigarettes and e-cigarettes. He has a 30.00 pack-year smoking history. He has never used smokeless  tobacco. He reports that he does not drink alcohol or use drugs.   Family History: The patient's family history includes Cancer in his paternal grandfather; Heart attack in his father and mother; Heart disease in his father, maternal grandmother, and mother; Hyperlipidemia in his father and mother.   Review of Systems: Please see the history of present illness.   All other systems are reviewed and negative.   Physical Exam: VS:  BP 112/78 (BP Location: Left Arm, Patient Position: Sitting, Cuff Size: Normal)   Pulse (!) 58   Ht 5\' 9"  (1.753 m)   Wt 170 lb (77.1 kg)   BMI 25.10 kg/m  .  BMI Body mass index is 25.1 kg/m.  Wt Readings from Last 3 Encounters:  02/23/19 170 lb (77.1 kg)  01/20/18 182 lb (82.6 kg)  11/26/16 171 lb 12.8 oz (77.9 kg)    General: Pleasant. Well developed, well nourished and in no acute distress.   HEENT: Normal.  Neck: Supple, no JVD, carotid bruits, or masses noted.  Cardiac: Regular rate and rhythm. No murmurs, rubs, or gallops. No edema.  Respiratory:  Lungs are clear to auscultation bilaterally with normal work of breathing.  GI: Soft and nontender.  MS: No deformity or atrophy. Gait and ROM intact.  Skin: Warm and dry. Color is normal.  Neuro:  Strength and sensation are intact and no gross focal deficits noted.  Psych: Alert, appropriate and with normal affect.   LABORATORY DATA:  EKG:  EKG is ordered today. This demonstrates sinus bradycardia - HR is 58.  Lab Results  Component Value Date   WBC 8.5 01/20/2018   HGB 15.0 01/20/2018   HCT 45.9 01/20/2018   PLT 254 01/20/2018   GLUCOSE 101 (H) 01/20/2018   CHOL 194 01/20/2018   TRIG 204 (H) 01/20/2018   HDL 33 (L) 01/20/2018   LDLCALC 120 (H) 01/20/2018   ALT 25 01/20/2018   AST 25 01/20/2018   NA 141 01/20/2018   K 5.0 01/20/2018   CL 107 (H) 01/20/2018   CREATININE 1.09 01/20/2018   BUN 12 01/20/2018   CO2 22 01/20/2018   TSH 1.530 07/03/2015   INR 1.08 07/03/2015   INR 1.08  07/03/2015   HGBA1C 5.7 (H) 07/03/2015     BNP (last 3 results) No results for input(s): BNP in the last 8760 hours.  ProBNP (last 3 results) No results for input(s): PROBNP in the last 8760 hours.   Other Studies Reviewed Today:  Cath 07/03/2015    Conclusion   1. Ost 2nd Diag to 2nd Diag lesion, 95% stenosed. Post PTCA intervention, there is a 55% residual stenosis. 2. Mid LAD lesion, 55% stenosed. Involved in the bifurcation lesion. This is in-stent restenosis. Post intervention - PTCA only, there is a 5% residual stenosis. 3. Ost 1st Sept lesion, 99% stenosed. 4. Mid RCA lesion, 50% stenosed. Dist RCA lesion, 30% stenosed. 5. Prox Cx to Mid Cx lesion, 25% stenosed. 6. Elevated LVEDP  For his lateral ST elevations, likely culprit lesion is the jailed D2 vessel with a 95% stenosis.  Ultimately the PTCA result on D2 was suboptimal, but after 2 angioplasty balloons ruptured at this lesion, I felt it safest to abort any further attempts. There was also given plaque shift into the stented segment of the LAD. This was treated with post-dilation balloon PTCA. Unfortunately in part of this process, the jailed septal perforator trunk with a 99% stenosis that had TIMI 2 flow initially and TIMI 1 flow at the end of the case. The patient was still having 5 out of 10 anginal pain as well as GERD symptoms at the end of the case. He was given 4 mg of morphine, 20 mg Pepcid IV, and the nitroglycerin glycerin infusion was increased. Upon arrival to the CCU room, the patient was relatively pain-free and feeling better. I suspect his pain was coming from the jailed septal perforator trunk that had TIMI 1 flow at the end of the case, likely related to increased LVEDP from systemic hypertension leading to high wall stress.   Plan:  Admit to CCU for post PTCA/PCI care.  TR band removal per protocol  Aggrastat infusion for 6 hours  Consider IV heparin for 24 hours, starting 8 hours after  sheath removal  Nitrogen infusion  for afterload reduction  1 dose of IV Lasix to be administered upon arrival CCU. - 20 mg  When necessary morphine for pain control  Based on this presentation, I would prefer not to fast track, however if he looks stable by the next day, to consider. He is already on Brilinta, but would need care management assistance to see if we can figure out a way to get his medications on discharge. Apparently has not been taking a lot of his medications besides Brilinta.  He needs to stop Smoking. Dr. Ellyn Hack       Echo Study Conclusions 06/2015  - Left ventricle: The cavity size was normal. Systolic function was  normal. The estimated ejection fraction was in the range of 60%  to 65%. Wall motion was normal; there were no regional wall  motion abnormalities. Left ventricular diastolic function  parameters were normal. - Aortic valve: There was no regurgitation. - Mitral valve: Structurally normal valve. - Right ventricle: The cavity size was normal. Wall thickness was  normal. Systolic function was normal. - Right atrium: The atrium was normal in size. - Tricuspid valve: There was trivial regurgitation. - Pulmonary arteries: Systolic pressure was within the normal  range. - Inferior vena cava: The vessel was normal in size. - Pericardium, extracardiac: There was no pericardial effusion.  Impressions:  - No regional wall motion abnormalities were seen on Definity  images.  Assessment/Plan:  1. CAD with history of recurrent angina - s/p cardiac cath from 2017 with angioplasty to 1st DX - has "jailed" septal perforator - angioplasty result was not idea - remains on DAPT - he is doing well with no worrisome symptoms. EKG stable.   2. HLD - has never started his statin - he says he is willing to take. Lab today.   3. Tobacco abuse - total cessation encouraged. He has used Chantix in the past (with his Zoloft) and did fine.   4. GERD -  on chronic PPI  5. PAD - no worrisome symptoms.   6. Chronic anxiety and depression - on Zoloft.   7. COVID-19 Education: The signs and symptoms of COVID-19 were discussed with the patient and how to seek care for testing (follow up with PCP or arrange E-visit).  The importance of social distancing, staying at home, hand hygiene and wearing a mask when out in public were discussed today.  Current medicines are reviewed with the patient today.  The patient does not have concerns regarding medicines other than what has been noted above.  The following changes have been made:  See above.  Labs/ tests ordered today include:    Orders Placed This Encounter  Procedures  . Basic metabolic panel  . CBC  . Hepatic function panel  . Lipid panel  . EKG 12-Lead     Disposition:   FU with [place] in one year.   Patient is agreeable to this plan and will call if any problems develop in the interim.   SignedTruitt Merle, NP  02/23/2019 8:55 AM  [facility] [place] [address] [place], [place]  [address] Phone: [phone] Fax: [phone]

## 2019-02-23 ENCOUNTER — Other Ambulatory Visit: Payer: Self-pay

## 2019-02-23 ENCOUNTER — Encounter: Payer: Self-pay | Admitting: Nurse Practitioner

## 2019-02-23 ENCOUNTER — Ambulatory Visit (INDEPENDENT_AMBULATORY_CARE_PROVIDER_SITE_OTHER): Payer: Self-pay | Admitting: Nurse Practitioner

## 2019-02-23 VITALS — BP 112/78 | HR 58 | Ht 69.0 in | Wt 170.0 lb

## 2019-02-23 DIAGNOSIS — E7800 Pure hypercholesterolemia, unspecified: Secondary | ICD-10-CM

## 2019-02-23 DIAGNOSIS — Z79899 Other long term (current) drug therapy: Secondary | ICD-10-CM

## 2019-02-23 DIAGNOSIS — I259 Chronic ischemic heart disease, unspecified: Secondary | ICD-10-CM

## 2019-02-23 DIAGNOSIS — Z7189 Other specified counseling: Secondary | ICD-10-CM

## 2019-02-23 DIAGNOSIS — E785 Hyperlipidemia, unspecified: Secondary | ICD-10-CM

## 2019-02-23 LAB — CBC
Hematocrit: 45.8 % (ref 37.5–51.0)
Hemoglobin: 15.3 g/dL (ref 13.0–17.7)
MCH: 29.3 pg (ref 26.6–33.0)
MCHC: 33.4 g/dL (ref 31.5–35.7)
MCV: 88 fL (ref 79–97)
Platelets: 177 10*3/uL (ref 150–450)
RBC: 5.22 x10E6/uL (ref 4.14–5.80)
RDW: 13.3 % (ref 11.6–15.4)
WBC: 6.9 10*3/uL (ref 3.4–10.8)

## 2019-02-23 LAB — BASIC METABOLIC PANEL
BUN/Creatinine Ratio: 15 (ref 9–20)
BUN: 14 mg/dL (ref 6–24)
CO2: 24 mmol/L (ref 20–29)
Calcium: 8.9 mg/dL (ref 8.7–10.2)
Chloride: 104 mmol/L (ref 96–106)
Creatinine, Ser: 0.95 mg/dL (ref 0.76–1.27)
GFR calc Af Amer: 106 mL/min/{1.73_m2} (ref >59)
GFR calc non Af Amer: 92 mL/min/{1.73_m2} (ref >59)
Glucose: 103 mg/dL — ABNORMAL HIGH (ref 65–99)
Potassium: 4.6 mmol/L (ref 3.5–5.2)
Sodium: 142 mmol/L (ref 134–144)

## 2019-02-23 LAB — HEPATIC FUNCTION PANEL
ALT: 10 IU/L (ref 0–44)
AST: 14 IU/L (ref 0–40)
Albumin: 4.2 g/dL (ref 3.8–4.9)
Alkaline Phosphatase: 106 IU/L (ref 39–117)
Bilirubin Total: 0.2 mg/dL (ref 0.0–1.2)
Bilirubin, Direct: 0.07 mg/dL (ref 0.00–0.40)
Total Protein: 6.4 g/dL (ref 6.0–8.5)

## 2019-02-23 LAB — LIPID PANEL
Chol/HDL Ratio: 5.7 ratio — ABNORMAL HIGH (ref 0.0–5.0)
Cholesterol, Total: 212 mg/dL — ABNORMAL HIGH (ref 100–199)
HDL: 37 mg/dL — ABNORMAL LOW (ref >39)
LDL Chol Calc (NIH): 149 mg/dL — ABNORMAL HIGH (ref 0–99)
Triglycerides: 141 mg/dL (ref 0–149)
VLDL Cholesterol Cal: 26 mg/dL (ref 5–40)

## 2019-02-23 MED ORDER — SERTRALINE HCL 100 MG PO TABS: 100.0000 mg | ORAL_TABLET | Freq: Every day | ORAL | 3 refills | Status: DC

## 2019-02-23 MED ORDER — METOPROLOL TARTRATE 25 MG PO TABS: 12.5000 mg | ORAL_TABLET | Freq: Two times a day (BID) | ORAL | 3 refills | Status: DC

## 2019-02-23 MED ORDER — NITROGLYCERIN 0.4 MG SL SUBL: 0.4000 mg | SUBLINGUAL_TABLET | SUBLINGUAL | 2 refills | Status: AC | PRN

## 2019-02-23 MED ORDER — ATORVASTATIN CALCIUM 80 MG PO TABS: 80.0000 mg | ORAL_TABLET | Freq: Every day | ORAL | 3 refills | Status: DC

## 2019-02-23 MED ORDER — OMEPRAZOLE 20 MG PO CPDR: DELAYED_RELEASE_CAPSULE | ORAL | 3 refills | Status: DC

## 2019-02-23 MED ORDER — TICAGRELOR 90 MG PO TABS: 90.0000 mg | ORAL_TABLET | Freq: Two times a day (BID) | ORAL | 3 refills | Status: DC

## 2019-02-23 NOTE — Patient Instructions (Addendum)
After Visit Summary:  We will be checking the following labs today - BMET, CBC, HPF and lipids   Medication Instructions:    Continue with your current medicines.   Taking your cholesterol medicine is very important.    If you need a refill on your cardiac medications before your next appointment, please call your pharmacy.     Testing/Procedures To Be Arranged:  N/A  Follow-Up:  See me in one year - You will receive a reminder letter in the mail two months in advance. If you don't receive a letter, please call our office to schedule the follow-up appointment.    At [facility], you and your health needs are our priority.  As part of our continuing mission to provide you with exceptional heart care, we have created designated Provider Care Teams.  These Care Teams include your primary Cardiologist (physician) and Advanced Practice Providers (APPs -  Physician Assistants and Nurse Practitioners) who all work together to provide you with the care you need, when you need it.  Special Instructions:  . Stay safe, stay home, wash your hands for at least 20 seconds and wear a mask when out in public.  . It was good to talk with you today.  . Try to work on your smoking.    Call the [facility] office at [phone] if you have any questions, problems or concerns.

## 2019-02-25 ENCOUNTER — Other Ambulatory Visit: Payer: Self-pay | Admitting: *Deleted

## 2019-02-25 DIAGNOSIS — E785 Hyperlipidemia, unspecified: Secondary | ICD-10-CM

## 2019-02-25 DIAGNOSIS — E7800 Pure hypercholesterolemia, unspecified: Secondary | ICD-10-CM

## 2019-03-27 DIAGNOSIS — Z125 Encounter for screening for malignant neoplasm of prostate: Secondary | ICD-10-CM | POA: Diagnosis not present

## 2019-03-27 DIAGNOSIS — E7800 Pure hypercholesterolemia, unspecified: Secondary | ICD-10-CM | POA: Diagnosis not present

## 2019-03-27 DIAGNOSIS — Z0000 Encounter for general adult medical examination without abnormal findings: Secondary | ICD-10-CM | POA: Diagnosis not present

## 2019-04-08 ENCOUNTER — Other Ambulatory Visit: Payer: Self-pay

## 2019-05-01 ENCOUNTER — Other Ambulatory Visit: Payer: Self-pay | Admitting: Nurse Practitioner

## 2019-05-08 ENCOUNTER — Other Ambulatory Visit: Payer: Self-pay | Admitting: *Deleted

## 2019-05-08 DIAGNOSIS — I739 Peripheral vascular disease, unspecified: Secondary | ICD-10-CM

## 2019-05-08 DIAGNOSIS — I779 Disorder of arteries and arterioles, unspecified: Secondary | ICD-10-CM

## 2019-05-15 ENCOUNTER — Ambulatory Visit (INDEPENDENT_AMBULATORY_CARE_PROVIDER_SITE_OTHER): Payer: BC Managed Care – PPO | Admitting: Vascular Surgery

## 2019-05-15 ENCOUNTER — Other Ambulatory Visit: Payer: Self-pay

## 2019-05-15 ENCOUNTER — Ambulatory Visit (INDEPENDENT_AMBULATORY_CARE_PROVIDER_SITE_OTHER)
Admission: RE | Admit: 2019-05-15 | Discharge: 2019-05-15 | Disposition: A | Discharge status: Home / Self Care | Payer: BC Managed Care – PPO | Source: Ambulatory Visit | Attending: Surgery | Admitting: Surgery

## 2019-05-15 ENCOUNTER — Encounter: Payer: Self-pay | Admitting: Vascular Surgery

## 2019-05-15 ENCOUNTER — Ambulatory Visit (HOSPITAL_COMMUNITY)
Admission: RE | Admit: 2019-05-15 | Discharge: 2019-05-15 | Disposition: A | Discharge status: Home / Self Care | Payer: BC Managed Care – PPO | Source: Ambulatory Visit | Attending: Surgery | Admitting: Surgery

## 2019-05-15 VITALS — BP 169/94 | HR 49 | Temp 97.6°F | Resp 20 | Ht 69.0 in | Wt 171.0 lb

## 2019-05-15 DIAGNOSIS — I779 Disorder of arteries and arterioles, unspecified: Secondary | ICD-10-CM

## 2019-05-15 DIAGNOSIS — I739 Peripheral vascular disease, unspecified: Secondary | ICD-10-CM | POA: Insufficient documentation

## 2019-05-15 NOTE — Progress Notes (Signed)
Patient ID: Tommy Nelles., male   DOB: 12-Aug-1965, 54 y.o.   MRN: [id number]  Reason for Consult: New Patient (Initial Visit)   Referred by Renford Dills, MD  Subjective:     HPI:  Tommy Alcock. is a 54 y.o. male has undergone aortobifemoral bypass in 2014.  This was for buttock clot occasion.  He does have some pain in his back.  He also has impotence but this is resolved with Viagra.  He does not have any claudication symptoms.  He denies tissue loss or ulceration.  He has never had stroke TIA or amaurosis.  He does have known carotid artery stenosis.  He has had coronary artery disease treated with stenting in the past.  He is currently on dual antiplatelet therapy as well as statins.  He had quit smoking but is back smoking a few cigarettes daily.  He works second shift at [employer] and is heading there after our visit.  He is hoping to get Chantix back from Dr. Dennie Maizes office in an effort to quit smoking again.  Past Medical History:  Diagnosis Date  . Acid reflux   . Anxiety   . CAD S/P percutaneous coronary angioplasty 01/06/2015   a. NSTEMI - Xience DES 3.5 x 33 p-mLAD; 60% D1, 50% mRCA   b. 95% ost D2 treated with suboptimal PTCA with 55% residual stenosis, 55% mid LAD, 55% mid LAD ISR treated with PTCA, 99% ost 1st septal perforator, 50% mid RCA  . Depression   . Hemorrhoid   . History of kidney stones   . Hyperlipidemia   . NSTEMI (non-ST elevated myocardial infarction) (HCC) 12/2014  . Peripheral vascular disease (HCC)    Family History  Problem Relation Age of Onset  . Heart disease Mother        NOT  before age 36  . Heart attack Mother   . Hyperlipidemia Mother   . Heart disease Maternal Grandmother   . Cancer Paternal Grandfather   . Heart disease Father        NOT  before age 36  . Heart attack Father   . Hyperlipidemia Father    Past Surgical History:  Procedure Laterality Date  . ABDOMINAL AORTAGRAM N/A 12/18/2012   Procedure: ABDOMINAL AORTAGRAM;   Surgeon: Fransisco Hertz, MD;  Location: [facility] CATH LAB;  Service: Cardiovascular;  Laterality: N/A;  . AORTA - BILATERAL FEMORAL ARTERY BYPASS GRAFT N/A 01/07/2013   Procedure: AORTA BIFEMORAL BYPASS GRAFT;  Surgeon: Fransisco Hertz, MD;  Location: [facility] OR;  Service: Vascular;  Laterality: N/A;  . CARDIAC CATHETERIZATION N/A 01/06/2015   Procedure: Left Heart Cath and Coronary Angiography;  Surgeon: Kathleene Hazel, MD;  Location: [facility] INVASIVE CV LAB;  Service: Cardiovascular;  Laterality: N/A;  . CARDIAC CATHETERIZATION N/A 01/06/2015   Procedure: Coronary Stent Intervention;  Surgeon: Kathleene Hazel, MD;  Location: [facility] INVASIVE CV LAB;  Service: Cardiovascular: Xience DES 3.5 mm x 33 mm p-mLAD  . CARDIAC CATHETERIZATION N/A 07/02/2015   Procedure: Left Heart Cath and Coronary Angiography;  Surgeon: Marykay Lex, MD;  Location: [facility] INVASIVE CV LAB;  Service: Cardiovascular;  Laterality: N/A;  . CARDIAC CATHETERIZATION N/A 07/02/2015   Procedure: Coronary Stent Intervention;  Surgeon: Marykay Lex, MD;  Location: [facility] INVASIVE CV LAB;  Service: Cardiovascular;  Laterality: N/A;  . ELBOW SURGERY Bilateral   . KIDNEY STONE SURGERY     x2    Short Social History:  Social History  Tobacco Use  . Smoking status: Former Smoker    Packs/day: 1.00    Years: 30.00    Pack years: 30.00    Types: Cigarettes, E-cigarettes  . Smokeless tobacco: Never Used  Substance Use Topics  . Alcohol use: No    Comment: occasional    Allergies  Allergen Reactions  . Simvastatin Nausea Only    Stomach aches    Current Outpatient Medications  Medication Sig Dispense Refill  . aspirin EC 81 MG tablet Take 81 mg by mouth daily.    [name] atorvastatin (LIPITOR) 80 MG tablet Take 1 tablet (80 mg total) by mouth daily. 90 tablet 3  . metoprolol succinate (TOPROL-XL) 25 MG 24 hr tablet Take 0.5 tablets (12.5 mg total) by mouth 2 (two) times daily. 90 tablet 2  . nitroGLYCERIN (NITROSTAT) 0.4 MG SL tablet Place 1  tablet (0.4 mg total) under the tongue every 5 (five) minutes as needed for chest pain (x 3 doses). 25 tablet 2  . omeprazole (PRILOSEC) 20 MG capsule TAKE 1 CAPSULE BY MOUTH EVERY DAY 90 capsule 3  . sertraline (ZOLOFT) 100 MG tablet Take 1 tablet (100 mg total) by mouth daily. 90 tablet 3  . sildenafil (REVATIO) 20 MG tablet Take 60 mg by mouth as needed.     . ticagrelor (BRILINTA) 90 MG TABS tablet Take 1 tablet (90 mg total) by mouth 2 (two) times daily. Please call and schedule an appt for further refills 1st attempt 180 tablet 3  . ibuprofen (ADVIL,MOTRIN) 200 MG tablet Take 400-600 mg by mouth every 6 (six) hours as needed for headache (pain).     No current facility-administered medications for this visit.    Review of Systems  Constitutional:  Constitutional negative. HENT: HENT negative.  Eyes: Eyes negative.  Cardiovascular:       Buttock pain with walking GI: Gastrointestinal negative.  GU:       Erectile dysfunction Musculoskeletal: Musculoskeletal negative.  Skin: Skin negative.  Neurological: Neurological negative. Hematologic: Hematologic/lymphatic negative.  Psychiatric: Psychiatric negative.        Objective:  Objective   Vitals:   05/15/19 1112  BP: (!) 169/94  Pulse: (!) 49  Resp: 20  Temp: 97.6 F (36.4 C)  SpO2: 97%  Weight: 171 lb (77.6 kg)  Height: 5\' 9"  (1.753 m)   Body mass index is 25.25 kg/m.  Physical Exam Constitutional:      Appearance: Normal appearance.  HENT:     Head: Normocephalic.     Nose: Nose normal.     Mouth/Throat:     Mouth: Mucous membranes are moist.  Eyes:     Pupils: Pupils are equal, round, and reactive to light.  Cardiovascular:     Rate and Rhythm: Normal rate and regular rhythm.     Pulses:          Femoral pulses are 2+ on the right side and 2+ on the left side. Pulmonary:     Effort: Pulmonary effort is normal.  Abdominal:     General: Abdomen is flat.     Palpations: Abdomen is soft. There is no  mass.  Musculoskeletal:        General: Normal range of motion.  Skin:    General: Skin is warm and dry.     Capillary Refill: Capillary refill takes less than 2 seconds.  Neurological:     General: No focal deficit present.     Mental Status: He is alert.  Psychiatric:  Mood and Affect: Mood normal.        Behavior: Behavior normal.        Thought Content: Thought content normal.        Judgment: Judgment normal.     Data: I have independently interpreted his ABIs to be 1.1 right and 0.99 left with toe pressures 111 right and 102 left biphasic bilaterally.  I have independently interpreted his bilateral carotid duplexes which demonstrate 40 to 59% stenosis bilaterally.     Assessment/Plan:     54 year old male has a history of aortobifemoral bypass grafting.  He has done very well from this with resolution of his symptoms mostly in his well-perfused feet at this time.  Does have moderate grade stenosis of bilateral carotid arteries.  He is on dual antiplatelet therapy as well as statins.  He will follow-up in 1 year with repeat ABIs and carotid duplex.    Waynetta Sandy MD [facility]

## 2019-05-18 ENCOUNTER — Other Ambulatory Visit: Payer: Self-pay | Admitting: *Deleted

## 2019-05-18 DIAGNOSIS — I739 Peripheral vascular disease, unspecified: Secondary | ICD-10-CM

## 2019-05-18 DIAGNOSIS — I779 Disorder of arteries and arterioles, unspecified: Secondary | ICD-10-CM

## 2019-10-26 DIAGNOSIS — Z20822 Contact with and (suspected) exposure to covid-19: Secondary | ICD-10-CM | POA: Diagnosis not present

## 2019-10-26 DIAGNOSIS — U071 COVID-19: Secondary | ICD-10-CM | POA: Diagnosis not present

## 2020-02-18 NOTE — Progress Notes (Signed)
Cardiology Office Note   Date:  02/23/2020   ID:  Tommy Samson., DOB April 03, 1965, MRN [id number]  PCP:  Renford Dills, MD  Cardiologist:   Dietrich Pates, MD   F/U of CAD      History of Present Illness: Tommy Trickett. is a 55 y.o. male with a  history of tobacco abuse, hyperlipidemia,  PAD (s/p aortobifemoral bypass graft  12/2012)  bilateral carotid artery disease . And CAD   Myoview stress test in 2014 was normal without ischemia, LVEF was normal 57%.   Cardiac catheterization was performed on 12/18 which showed 99% proximal LAD lesion treated with DES (Xience 3.5 x 33 mm DES), 60% D2 stenosis, 70% proximal to mid LAD lesion covered with same stent, 50% mid RCA lesion, 30% distal RCA lesion, EF 45-50%. Postprocedure, he was enrolled in [place] trial and placed on aspirin and Brilinta. Echocardiogram was obtained on 12/9 which showed EF 55-60%, biplane LVEF 67% by speckle tracking, not all myocardial segments were well visualized, normal diastolic function, unable to assess PASP.   He was last seen in cardiology clinic in 2021 by L Gerhardt  SInce seen the pt says he has been having more episodes of CP with activity   Also notes increased SOB with activity   He says he did not have this when last seen    Similar to what he felt prior to his last cath    No symtoms at rest     Outpatient Medications Prior to Visit  Medication Sig Dispense Refill   aspirin EC 81 MG tablet Take 81 mg by mouth daily.     atorvastatin (LIPITOR) 80 MG tablet Take 1 tablet (80 mg total) by mouth daily. 90 tablet 3   ibuprofen (ADVIL,MOTRIN) 200 MG tablet Take 400-600 mg by mouth every 6 (six) hours as needed for headache (pain).     metoprolol succinate (TOPROL-XL) 25 MG 24 hr tablet Take 0.5 tablets (12.5 mg total) by mouth 2 (two) times daily. 90 tablet 2   nitroGLYCERIN (NITROSTAT) 0.4 MG SL tablet Place 1 tablet (0.4 mg total) under the tongue every 5 (five) minutes as needed for chest pain (x  3 doses). 25 tablet 2   omeprazole (PRILOSEC) 20 MG capsule TAKE 1 CAPSULE BY MOUTH EVERY DAY 90 capsule 3   sertraline (ZOLOFT) 100 MG tablet Take 1 tablet (100 mg total) by mouth daily. 90 tablet 3   sildenafil (REVATIO) 20 MG tablet Take 60 mg by mouth as needed.      ticagrelor (BRILINTA) 90 MG TABS tablet Take 1 tablet (90 mg total) by mouth 2 (two) times daily. Please call and schedule an appt for further refills 1st attempt 180 tablet 3   No facility-administered medications prior to visit.     Allergies:   Simvastatin   Past Medical History:  Diagnosis Date   Acid reflux    Anxiety    CAD S/P percutaneous coronary angioplasty 01/06/2015   a. NSTEMI - Xience DES 3.5 x 33 p-mLAD; 60% D1, 50% mRCA   b. 95% ost D2 treated with suboptimal PTCA with 55% residual stenosis, 55% mid LAD, 55% mid LAD ISR treated with PTCA, 99% ost 1st septal perforator, 50% mid RCA   Depression    Hemorrhoid    History of kidney stones    Hyperlipidemia    NSTEMI (non-ST elevated myocardial infarction) (HCC) 12/2014   Peripheral vascular disease (HCC)     Past Surgical History:  Procedure Laterality Date   ABDOMINAL AORTAGRAM N/A 12/18/2012   Procedure: ABDOMINAL AORTAGRAM;  Surgeon: Fransisco Hertz, MD;  Location: [facility] CATH LAB;  Service: Cardiovascular;  Laterality: N/A;   AORTA - BILATERAL FEMORAL ARTERY BYPASS GRAFT N/A 01/07/2013   Procedure: AORTA BIFEMORAL BYPASS GRAFT;  Surgeon: Fransisco Hertz, MD;  Location: [facility] OR;  Service: Vascular;  Laterality: N/A;   CARDIAC CATHETERIZATION N/A 01/06/2015   Procedure: Left Heart Cath and Coronary Angiography;  Surgeon: Kathleene Hazel, MD;  Location: [facility] INVASIVE CV LAB;  Service: Cardiovascular;  Laterality: N/A;   CARDIAC CATHETERIZATION N/A 01/06/2015   Procedure: Coronary Stent Intervention;  Surgeon: Kathleene Hazel, MD;  Location: [facility] INVASIVE CV LAB;  Service: Cardiovascular: Xience DES 3.5 mm x 33 mm p-mLAD   CARDIAC  CATHETERIZATION N/A 07/02/2015   Procedure: Left Heart Cath and Coronary Angiography;  Surgeon: Marykay Lex, MD;  Location: [facility] INVASIVE CV LAB;  Service: Cardiovascular;  Laterality: N/A;   CARDIAC CATHETERIZATION N/A 07/02/2015   Procedure: Coronary Stent Intervention;  Surgeon: Marykay Lex, MD;  Location: [facility] INVASIVE CV LAB;  Service: Cardiovascular;  Laterality: N/A;   ELBOW SURGERY Bilateral    KIDNEY STONE SURGERY     x2     Social History:  The patient  reports that he has quit smoking. His smoking use included cigarettes and e-cigarettes. He has a 30.00 pack-year smoking history. He has never used smokeless tobacco. He reports that he does not drink alcohol and does not use drugs.   Family History:  The patient's family history includes Cancer in his paternal grandfather; Heart attack in his father and mother; Heart disease in his father, maternal grandmother, and mother; Hyperlipidemia in his father and mother.    ROS:  Please see the history of present illness. All other systems are reviewed and  Negative to the above problem except as noted.    PHYSICAL EXAM: VS:  BP 118/70    Pulse 62    Ht 5\' 9"  (1.753 m)    Wt 174 lb 12.8 oz (79.3 kg)    BMI 25.81 kg/m   GEN: Well nourished, well developed, in no acute distress  HEENT: normal  Neck: no JVD, carotid bruits, Cardiac: RRR; no murmurs, No LE  edema  Respiratory:  clear to auscultation bilaterally, GI: soft, nontender, nondistended, + BS  No hepatomegaly  MS: no deformity Moving all extremities   Skin: warm and dry, no rash Neuro:  Strength and sensation are intact Psych: euthymic mood, full affect   EKG:  EKG is done  NSR 62 bpm    Lipid Panel    Component Value Date/Time   CHOL 212 (H) 02/23/2019 0912   TRIG 141 02/23/2019 0912   HDL 37 (L) 02/23/2019 0912   CHOLHDL 5.7 (H) 02/23/2019 0912   CHOLHDL 3.4 07/03/2015 0530   VLDL 14 07/03/2015 0530   LDLCALC 149 (H) 02/23/2019 0912      Wt Readings from  Last 3 Encounters:  02/23/20 174 lb 12.8 oz (79.3 kg)  05/15/19 171 lb (77.6 kg)  02/23/19 170 lb (77.1 kg)      ASSESSMENT AND PLAN:  1  CAD Pt now with recurrent chest pressure and SOB with exertion.   He thinks similar to when he had intervention in past   I would recomm L heart cath to redefine anatomy   Risks/benefits described Pt understands and agrees to proceed.   Precath labs today    2.  PVOD Last ABIs in APril 2021 were mildly abnormal on L   Normal R   Carotid USN in 04/2019:  mod plaquing bilaterally    Will need to follow with periodic USNs  3.  HL  Last lipids were in early 2021   WIll repeat   Counselled on diet  He reports that it is bad  Fast foods   4.  Tobacco  Still smoking  Counselled on cesssation      Disposition:   FU with me in 4 to 5 months    Signed, Dietrich Pates, MD  02/23/2020 11:48 AM    [facility] [address] [place], [place], [place]  [address] Phone: [phone]; Fax: [phone]

## 2020-02-18 NOTE — H&P (View-Only) (Signed)
Cardiology Office Note   Date:  02/23/2020   ID:  Tommy Malone., DOB [birth year], MRN [id number]  PCP:  Renford Dills, MD  Cardiologist:   Dietrich Pates, MD   F/U of CAD      History of Present Illness: Tommy Malone. is a 55 y.o. male with a  history of tobacco abuse, hyperlipidemia,  PAD (s/p aortobifemoral bypass graft  12/2012)  bilateral carotid artery disease . And CAD   Myoview stress test in 2014 was normal without ischemia, LVEF was normal 57%.   Cardiac catheterization was performed on 12/18 which showed 99% proximal LAD lesion treated with DES (Xience 3.5 x 33 mm DES), 60% D2 stenosis, 70% proximal to mid LAD lesion covered with same stent, 50% mid RCA lesion, 30% distal RCA lesion, EF 45-50%. Postprocedure, he was enrolled in [place] trial and placed on aspirin and Brilinta. Echocardiogram was obtained on 12/9 which showed EF 55-60%, biplane LVEF 67% by speckle tracking, not all myocardial segments were well visualized, normal diastolic function, unable to assess PASP.   He was last seen in cardiology clinic in 2021 by L Gerhardt  SInce seen the pt says he has been having more episodes of CP with activity   Also notes increased SOB with activity   He says he did not have this when last seen    Similar to what he felt prior to his last cath    No symtoms at rest     Outpatient Medications Prior to Visit  Medication Sig Dispense Refill  . aspirin EC 81 MG tablet Take 81 mg by mouth daily.    [name] atorvastatin (LIPITOR) 80 MG tablet Take 1 tablet (80 mg total) by mouth daily. 90 tablet 3  . ibuprofen (ADVIL,MOTRIN) 200 MG tablet Take 400-600 mg by mouth every 6 (six) hours as needed for headache (pain).    . metoprolol succinate (TOPROL-XL) 25 MG 24 hr tablet Take 0.5 tablets (12.5 mg total) by mouth 2 (two) times daily. 90 tablet 2  . nitroGLYCERIN (NITROSTAT) 0.4 MG SL tablet Place 1 tablet (0.4 mg total) under the tongue every 5 (five) minutes as needed for chest pain (x  3 doses). 25 tablet 2  . omeprazole (PRILOSEC) 20 MG capsule TAKE 1 CAPSULE BY MOUTH EVERY DAY 90 capsule 3  . sertraline (ZOLOFT) 100 MG tablet Take 1 tablet (100 mg total) by mouth daily. 90 tablet 3  . sildenafil (REVATIO) 20 MG tablet Take 60 mg by mouth as needed.     . ticagrelor (BRILINTA) 90 MG TABS tablet Take 1 tablet (90 mg total) by mouth 2 (two) times daily. Please call and schedule an appt for further refills 1st attempt 180 tablet 3   No facility-administered medications prior to visit.     Allergies:   Simvastatin   Past Medical History:  Diagnosis Date  . Acid reflux   . Anxiety   . CAD S/P percutaneous coronary angioplasty 01/06/2015   a. NSTEMI - Xience DES 3.5 x 33 p-mLAD; 60% D1, 50% mRCA   b. 95% ost D2 treated with suboptimal PTCA with 55% residual stenosis, 55% mid LAD, 55% mid LAD ISR treated with PTCA, 99% ost 1st septal perforator, 50% mid RCA  . Depression   . Hemorrhoid   . History of kidney stones   . Hyperlipidemia   . NSTEMI (non-ST elevated myocardial infarction) (HCC) 12/2014  . Peripheral vascular disease [facility])     Past Surgical History:  Procedure Laterality Date  . ABDOMINAL AORTAGRAM N/A 12/18/2012   Procedure: ABDOMINAL AORTAGRAM;  Surgeon: Fransisco Hertz, MD;  Location: [facility] CATH LAB;  Service: Cardiovascular;  Laterality: N/A;  . AORTA - BILATERAL FEMORAL ARTERY BYPASS GRAFT N/A 01/07/2013   Procedure: AORTA BIFEMORAL BYPASS GRAFT;  Surgeon: Fransisco Hertz, MD;  Location: [facility] OR;  Service: Vascular;  Laterality: N/A;  . CARDIAC CATHETERIZATION N/A 01/06/2015   Procedure: Left Heart Cath and Coronary Angiography;  Surgeon: Kathleene Hazel, MD;  Location: [facility] INVASIVE CV LAB;  Service: Cardiovascular;  Laterality: N/A;  . CARDIAC CATHETERIZATION N/A 01/06/2015   Procedure: Coronary Stent Intervention;  Surgeon: Kathleene Hazel, MD;  Location: [facility] INVASIVE CV LAB;  Service: Cardiovascular: Xience DES 3.5 mm x 33 mm p-mLAD  . CARDIAC  CATHETERIZATION N/A 07/02/2015   Procedure: Left Heart Cath and Coronary Angiography;  Surgeon: Marykay Lex, MD;  Location: [facility] INVASIVE CV LAB;  Service: Cardiovascular;  Laterality: N/A;  . CARDIAC CATHETERIZATION N/A 07/02/2015   Procedure: Coronary Stent Intervention;  Surgeon: Marykay Lex, MD;  Location: [facility] INVASIVE CV LAB;  Service: Cardiovascular;  Laterality: N/A;  . ELBOW SURGERY Bilateral   . KIDNEY STONE SURGERY     x2     Social History:  The patient  reports that he has quit smoking. His smoking use included cigarettes and e-cigarettes. He has a 30.00 pack-year smoking history. He has never used smokeless tobacco. He reports that he does not drink alcohol and does not use drugs.   Family History:  The patient's family history includes Cancer in his paternal grandfather; Heart attack in his father and mother; Heart disease in his father, maternal grandmother, and mother; Hyperlipidemia in his father and mother.    ROS:  Please see the history of present illness. All other systems are reviewed and  Negative to the above problem except as noted.    PHYSICAL EXAM: VS:  BP 118/70   Pulse 62   Ht 5\' 9"  (1.753 m)   Wt 174 lb 12.8 oz (79.3 kg)   BMI 25.81 kg/m   GEN: Well nourished, well developed, in no acute distress  HEENT: normal  Neck: no JVD, carotid bruits, Cardiac: RRR; no murmurs, No LE  edema  Respiratory:  clear to auscultation bilaterally, GI: soft, nontender, nondistended, + BS  No hepatomegaly  MS: no deformity Moving all extremities   Skin: warm and dry, no rash Neuro:  Strength and sensation are intact Psych: euthymic mood, full affect   EKG:  EKG is done  NSR 62 bpm    Lipid Panel    Component Value Date/Time   CHOL 212 (H) 02/23/2019 0912   TRIG 141 02/23/2019 0912   HDL 37 (L) 02/23/2019 0912   CHOLHDL 5.7 (H) 02/23/2019 0912   CHOLHDL 3.4 07/03/2015 0530   VLDL 14 07/03/2015 0530   LDLCALC 149 (H) 02/23/2019 0912      Wt Readings from  Last 3 Encounters:  02/23/20 174 lb 12.8 oz (79.3 kg)  05/15/19 171 lb (77.6 kg)  02/23/19 170 lb (77.1 kg)      ASSESSMENT AND PLAN:  1  CAD Pt now with recurrent chest pressure and SOB with exertion.   He thinks similar to when he had intervention in past   I would recomm L heart cath to redefine anatomy   Risks/benefits described Pt understands and agrees to proceed.   Precath labs today    2.  PVOD Last ABIs in  APril 2021 were mildly abnormal on L   Normal R   Carotid USN in 04/2019:  mod plaquing bilaterally    Will need to follow with periodic USNs  3.  HL  Last lipids were in early 2021   WIll repeat   Counselled on diet  He reports that it is bad  Fast foods   4.  Tobacco  Still smoking  Counselled on cesssation      Disposition:   FU with me in 4 to 5 months    Signed, Dietrich Pates, MD  02/23/2020 11:48 AM    [facility] [address] [place], [place], [place]  [address] Phone: [phone]; Fax: [phone]

## 2020-02-23 ENCOUNTER — Other Ambulatory Visit: Payer: Self-pay | Admitting: Internal Medicine

## 2020-02-23 ENCOUNTER — Other Ambulatory Visit: Payer: Self-pay

## 2020-02-23 ENCOUNTER — Ambulatory Visit (INDEPENDENT_AMBULATORY_CARE_PROVIDER_SITE_OTHER): Payer: BC Managed Care – PPO | Admitting: Internal Medicine

## 2020-02-23 ENCOUNTER — Encounter: Payer: Self-pay | Admitting: *Deleted

## 2020-02-23 ENCOUNTER — Encounter: Payer: Self-pay | Admitting: Internal Medicine

## 2020-02-23 VITALS — BP 118/70 | HR 62 | Ht 69.0 in | Wt 174.8 lb

## 2020-02-23 DIAGNOSIS — E785 Hyperlipidemia, unspecified: Secondary | ICD-10-CM

## 2020-02-23 DIAGNOSIS — Z01812 Encounter for preprocedural laboratory examination: Secondary | ICD-10-CM

## 2020-02-23 DIAGNOSIS — Z9861 Coronary angioplasty status: Secondary | ICD-10-CM

## 2020-02-23 DIAGNOSIS — I2510 Atherosclerotic heart disease of native coronary artery without angina pectoris: Secondary | ICD-10-CM | POA: Diagnosis not present

## 2020-02-23 LAB — BASIC METABOLIC PANEL
BUN/Creatinine Ratio: 14 (ref 9–20)
BUN: 14 mg/dL (ref 6–24)
CO2: 23 mmol/L (ref 20–29)
Calcium: 9 mg/dL (ref 8.7–10.2)
Chloride: 103 mmol/L (ref 96–106)
Creatinine, Ser: 1.03 mg/dL (ref 0.76–1.27)
GFR calc Af Amer: 95 mL/min/{1.73_m2} (ref >59)
GFR calc non Af Amer: 83 mL/min/{1.73_m2} (ref >59)
Glucose: 81 mg/dL (ref 65–99)
Potassium: 4.6 mmol/L (ref 3.5–5.2)
Sodium: 138 mmol/L (ref 134–144)

## 2020-02-23 LAB — CBC
Hematocrit: 39.3 % (ref 37.5–51.0)
Hemoglobin: 11.6 g/dL — ABNORMAL LOW (ref 13.0–17.7)
MCH: 22.4 pg — ABNORMAL LOW (ref 26.6–33.0)
MCHC: 29.5 g/dL — ABNORMAL LOW (ref 31.5–35.7)
MCV: 76 fL — ABNORMAL LOW (ref 79–97)
Platelets: 208 10*3/uL (ref 150–450)
RBC: 5.17 x10E6/uL (ref 4.14–5.80)
RDW: 16.2 % — ABNORMAL HIGH (ref 11.6–15.4)
WBC: 7.6 10*3/uL (ref 3.4–10.8)

## 2020-02-23 LAB — LIPID PANEL
Chol/HDL Ratio: 3.9 ratio (ref 0.0–5.0)
Cholesterol, Total: 143 mg/dL (ref 100–199)
HDL: 37 mg/dL — ABNORMAL LOW (ref >39)
LDL Chol Calc (NIH): 76 mg/dL (ref 0–99)
Triglycerides: 176 mg/dL — ABNORMAL HIGH (ref 0–149)
VLDL Cholesterol Cal: 30 mg/dL (ref 5–40)

## 2020-02-23 LAB — TSH: TSH: 2.19 u[IU]/mL (ref 0.450–4.500)

## 2020-02-23 NOTE — Patient Instructions (Signed)
Medication Instructions:  No changes *If you need a refill on your cardiac medications before your next appointment, please call your pharmacy*   Lab Work: Today: cbc, bmet, tsh, lipids If you have labs (blood work) drawn today and your tests are completely normal, you will receive your results only by: [name] MyChart Message (if you have MyChart) OR . A paper copy in the mail If you have any lab test that is abnormal or we need to change your treatment, we will call you to review the results.   Testing/Procedures: Your physician has requested that you have a cardiac catheterization. Cardiac catheterization is used to diagnose and/or treat various heart conditions. Doctors may recommend this procedure for a number of different reasons. The most common reason is to evaluate chest pain. Chest pain can be a symptom of coronary artery disease (CAD), and cardiac catheterization can show whether plaque is narrowing or blocking your heart's arteries. This procedure is also used to evaluate the valves, as well as measure the blood flow and oxygen levels in different parts of your heart. For further information please visit [email or web address] Please follow instruction sheet, as given.    Follow-Up: At [facility], you and your health needs are our priority.  As part of our continuing mission to provide you with exceptional heart care, we have created designated Provider Care Teams.  These Care Teams include your primary Cardiologist (physician) and Advanced Practice Providers (APPs -  Physician Assistants and Nurse Practitioners) who all work together to provide you with the care you need, when you need it.  Your next appointment:   4-6 week(s)  The format for your next appointment:   In Person  Provider:   You may see Dietrich Pates, MD or one of the following Advanced Practice Providers on your designated Care Team:    Tereso Newcomer, PA-C  Chelsea Aus, [place]    Other Instructions

## 2020-02-25 ENCOUNTER — Other Ambulatory Visit: Payer: Self-pay | Admitting: Nurse Practitioner

## 2020-02-26 ENCOUNTER — Other Ambulatory Visit: Payer: Self-pay | Admitting: Nurse Practitioner

## 2020-03-02 ENCOUNTER — Other Ambulatory Visit (HOSPITAL_COMMUNITY)
Admission: RE | Admit: 2020-03-02 | Discharge: 2020-03-02 | Disposition: A | Discharge status: Home / Self Care | Payer: BC Managed Care – PPO | Source: Ambulatory Visit | Attending: Cardiology | Admitting: Cardiology

## 2020-03-02 DIAGNOSIS — I739 Peripheral vascular disease, unspecified: Secondary | ICD-10-CM | POA: Diagnosis not present

## 2020-03-02 DIAGNOSIS — Z20822 Contact with and (suspected) exposure to covid-19: Secondary | ICD-10-CM | POA: Insufficient documentation

## 2020-03-02 DIAGNOSIS — Z8249 Family history of ischemic heart disease and other diseases of the circulatory system: Secondary | ICD-10-CM | POA: Diagnosis not present

## 2020-03-02 DIAGNOSIS — E785 Hyperlipidemia, unspecified: Secondary | ICD-10-CM | POA: Diagnosis not present

## 2020-03-02 DIAGNOSIS — Z01812 Encounter for preprocedural laboratory examination: Secondary | ICD-10-CM | POA: Insufficient documentation

## 2020-03-02 DIAGNOSIS — I2582 Chronic total occlusion of coronary artery: Secondary | ICD-10-CM | POA: Diagnosis not present

## 2020-03-02 DIAGNOSIS — F17210 Nicotine dependence, cigarettes, uncomplicated: Secondary | ICD-10-CM | POA: Diagnosis not present

## 2020-03-02 DIAGNOSIS — I6523 Occlusion and stenosis of bilateral carotid arteries: Secondary | ICD-10-CM | POA: Diagnosis not present

## 2020-03-02 DIAGNOSIS — I25110 Atherosclerotic heart disease of native coronary artery with unstable angina pectoris: Secondary | ICD-10-CM | POA: Diagnosis not present

## 2020-03-02 DIAGNOSIS — Z79899 Other long term (current) drug therapy: Secondary | ICD-10-CM | POA: Diagnosis not present

## 2020-03-02 DIAGNOSIS — Z7982 Long term (current) use of aspirin: Secondary | ICD-10-CM | POA: Diagnosis not present

## 2020-03-02 DIAGNOSIS — Z955 Presence of coronary angioplasty implant and graft: Secondary | ICD-10-CM | POA: Diagnosis not present

## 2020-03-02 LAB — SARS CORONAVIRUS 2 (TAT 6-24 HRS): SARS Coronavirus 2: NEGATIVE

## 2020-03-03 ENCOUNTER — Telehealth: Payer: Self-pay | Admitting: *Deleted

## 2020-03-03 NOTE — Telephone Encounter (Signed)
Pt contacted pre-catheterization scheduled at [facility] for: Friday March 04, 2020 7:30 AM Verified arrival time and place: [facility] Main Entrance A [facility]) at: 5:30 AM   No solid food after midnight prior to cath, clear liquids until 5 AM day of procedure.  Hold: Sildenafil-until post procedure  AM meds can be  taken pre-cath with sips of water including: ASA 81 mg   Confirmed patient has responsible adult to drive home post procedure and be with patient first 24 hours after arriving home:   You are allowed ONE visitor in the waiting room during the time you are at the hospital for your procedure. Both you and your visitor must wear a mask once you enter the hospital.   [facility] to review procedure instructions with patient.

## 2020-03-03 NOTE — Telephone Encounter (Signed)
No answer, voicemail message. 

## 2020-03-04 ENCOUNTER — Encounter (HOSPITAL_COMMUNITY)
Admission: RE | Disposition: A | Discharge status: Home / Self Care | Payer: Self-pay | Source: Home / Self Care | Attending: Cardiology

## 2020-03-04 ENCOUNTER — Ambulatory Visit (HOSPITAL_COMMUNITY)
Admission: RE | Admit: 2020-03-04 | Discharge: 2020-03-04 | Disposition: A | Discharge status: Home / Self Care | Payer: BC Managed Care – PPO | Attending: Cardiology | Admitting: Cardiology

## 2020-03-04 ENCOUNTER — Encounter (HOSPITAL_COMMUNITY): Payer: Self-pay | Admitting: Cardiology

## 2020-03-04 DIAGNOSIS — Z955 Presence of coronary angioplasty implant and graft: Secondary | ICD-10-CM | POA: Insufficient documentation

## 2020-03-04 DIAGNOSIS — Z20822 Contact with and (suspected) exposure to covid-19: Secondary | ICD-10-CM | POA: Insufficient documentation

## 2020-03-04 DIAGNOSIS — Z8249 Family history of ischemic heart disease and other diseases of the circulatory system: Secondary | ICD-10-CM | POA: Insufficient documentation

## 2020-03-04 DIAGNOSIS — Z7982 Long term (current) use of aspirin: Secondary | ICD-10-CM | POA: Insufficient documentation

## 2020-03-04 DIAGNOSIS — E785 Hyperlipidemia, unspecified: Secondary | ICD-10-CM | POA: Diagnosis present

## 2020-03-04 DIAGNOSIS — Z79899 Other long term (current) drug therapy: Secondary | ICD-10-CM | POA: Diagnosis not present

## 2020-03-04 DIAGNOSIS — F17210 Nicotine dependence, cigarettes, uncomplicated: Secondary | ICD-10-CM | POA: Diagnosis not present

## 2020-03-04 DIAGNOSIS — I25118 Atherosclerotic heart disease of native coronary artery with other forms of angina pectoris: Secondary | ICD-10-CM

## 2020-03-04 DIAGNOSIS — I25110 Atherosclerotic heart disease of native coronary artery with unstable angina pectoris: Secondary | ICD-10-CM | POA: Insufficient documentation

## 2020-03-04 DIAGNOSIS — I25119 Atherosclerotic heart disease of native coronary artery with unspecified angina pectoris: Secondary | ICD-10-CM | POA: Diagnosis present

## 2020-03-04 DIAGNOSIS — Z720 Tobacco use: Secondary | ICD-10-CM | POA: Diagnosis present

## 2020-03-04 DIAGNOSIS — I6523 Occlusion and stenosis of bilateral carotid arteries: Secondary | ICD-10-CM | POA: Diagnosis not present

## 2020-03-04 DIAGNOSIS — I2510 Atherosclerotic heart disease of native coronary artery without angina pectoris: Secondary | ICD-10-CM

## 2020-03-04 DIAGNOSIS — I2582 Chronic total occlusion of coronary artery: Secondary | ICD-10-CM | POA: Diagnosis not present

## 2020-03-04 DIAGNOSIS — I739 Peripheral vascular disease, unspecified: Secondary | ICD-10-CM | POA: Diagnosis not present

## 2020-03-04 DIAGNOSIS — Z9861 Coronary angioplasty status: Secondary | ICD-10-CM

## 2020-03-04 HISTORY — PX: LEFT HEART CATH AND CORONARY ANGIOGRAPHY: CATH118249

## 2020-03-04 HISTORY — PX: CORONARY PRESSURE/FFR STUDY: CATH118243

## 2020-03-04 SURGERY — LEFT HEART CATH AND CORONARY ANGIOGRAPHY
Anesthesia: LOCAL

## 2020-03-04 MED ORDER — FENTANYL CITRATE (PF) 100 MCG/2ML IJ SOLN
INTRAMUSCULAR | Status: AC
  Filled 2020-03-04: qty 2

## 2020-03-04 MED ORDER — LIDOCAINE HCL (PF) 1 % IJ SOLN
INTRAMUSCULAR | Status: AC
  Filled 2020-03-04: qty 30

## 2020-03-04 MED ORDER — CLOPIDOGREL BISULFATE 75 MG PO TABS: 75.0000 mg | ORAL_TABLET | Freq: Every day | ORAL | 11 refills | Status: DC

## 2020-03-04 MED ORDER — HEPARIN (PORCINE) IN NACL 1000-0.9 UT/500ML-% IV SOLN
INTRAVENOUS | Status: AC
  Filled 2020-03-04: qty 1000

## 2020-03-04 MED ORDER — MIDAZOLAM HCL 2 MG/2ML IJ SOLN
INTRAMUSCULAR | Status: DC | PRN
  Administered 2020-03-04: 1 mg via INTRAVENOUS

## 2020-03-04 MED ORDER — LIDOCAINE HCL (PF) 1 % IJ SOLN
INTRAMUSCULAR | Status: DC | PRN
  Administered 2020-03-04: 2 mL

## 2020-03-04 MED ORDER — SODIUM CHLORIDE 0.9 % WEIGHT BASED INFUSION: 1.0000 mL/kg/h | INTRAVENOUS | Status: DC

## 2020-03-04 MED ORDER — MIDAZOLAM HCL 2 MG/2ML IJ SOLN
INTRAMUSCULAR | Status: AC
  Filled 2020-03-04: qty 2

## 2020-03-04 MED ORDER — ADENOSINE 12 MG/4ML IV SOLN
INTRAVENOUS | Status: AC
  Filled 2020-03-04: qty 12

## 2020-03-04 MED ORDER — ISOSORBIDE MONONITRATE ER 30 MG PO TB24: 30.0000 mg | ORAL_TABLET | Freq: Every day | ORAL | 11 refills | Status: DC

## 2020-03-04 MED ORDER — FENTANYL CITRATE (PF) 100 MCG/2ML IJ SOLN
INTRAMUSCULAR | Status: DC | PRN
  Administered 2020-03-04: 25 ug via INTRAVENOUS

## 2020-03-04 MED ORDER — VERAPAMIL HCL 2.5 MG/ML IV SOLN
INTRAVENOUS | Status: AC
  Filled 2020-03-04: qty 2

## 2020-03-04 MED ORDER — ADENOSINE (DIAGNOSTIC) 140MCG/KG/MIN
INTRAVENOUS | Status: DC | PRN
  Administered 2020-03-04: 140 ug/kg/min via INTRAVENOUS

## 2020-03-04 MED ORDER — HEPARIN SODIUM (PORCINE) 1000 UNIT/ML IJ SOLN
INTRAMUSCULAR | Status: DC | PRN
  Administered 2020-03-04: 4000 [IU] via INTRAVENOUS
  Administered 2020-03-04: 2000 [IU] via INTRAVENOUS
  Administered 2020-03-04: 3500 [IU] via INTRAVENOUS

## 2020-03-04 MED ORDER — SODIUM CHLORIDE 0.9% FLUSH: 3.0000 mL | INTRAVENOUS | Status: DC | PRN

## 2020-03-04 MED ORDER — IOHEXOL 350 MG/ML SOLN
INTRAVENOUS | Status: DC | PRN
  Administered 2020-03-04: 80 mL

## 2020-03-04 MED ORDER — SODIUM CHLORIDE 0.9 % IV SOLN: 250.0000 mL | INTRAVENOUS | Status: DC | PRN

## 2020-03-04 MED ORDER — SODIUM CHLORIDE 0.9 % WEIGHT BASED INFUSION
3.0000 mL/kg/h | INTRAVENOUS | Status: AC
  Administered 2020-03-04: 3 mL/kg/h via INTRAVENOUS

## 2020-03-04 MED ORDER — HEPARIN (PORCINE) IN NACL 1000-0.9 UT/500ML-% IV SOLN
INTRAVENOUS | Status: DC | PRN
  Administered 2020-03-04 (×2): 500 mL

## 2020-03-04 MED ORDER — TICAGRELOR 90 MG PO TABS: 90.0000 mg | ORAL_TABLET | ORAL | Status: AC

## 2020-03-04 MED ORDER — SODIUM CHLORIDE 0.9% FLUSH: 3.0000 mL | Freq: Two times a day (BID) | INTRAVENOUS | Status: DC

## 2020-03-04 MED ORDER — ASPIRIN 81 MG PO CHEW
81.0000 mg | CHEWABLE_TABLET | ORAL | Status: AC
  Administered 2020-03-04: 81 mg via ORAL
  Filled 2020-03-04: qty 1

## 2020-03-04 MED ORDER — ADENOSINE 12 MG/4ML IV SOLN
INTRAVENOUS | Status: AC
  Filled 2020-03-04: qty 4

## 2020-03-04 MED ORDER — VERAPAMIL HCL 2.5 MG/ML IV SOLN
INTRAVENOUS | Status: DC | PRN
  Administered 2020-03-04: 10 mL via INTRA_ARTERIAL

## 2020-03-04 SURGICAL SUPPLY — 13 items
CATH INFINITI 5FR ANG PIGTAIL (CATHETERS) ×2 IMPLANT
CATH OPTITORQUE TIG 4.0 5F (CATHETERS) ×2 IMPLANT
CATH VISTA GUIDE 6FR XBLAD3.5 (CATHETERS) ×2 IMPLANT
DEVICE RAD COMP TR BAND LRG (VASCULAR PRODUCTS) ×2 IMPLANT
GLIDESHEATH SLEND SS 6F .021 (SHEATH) ×2 IMPLANT
GUIDEWIRE INQWIRE 1.5J.035X260 (WIRE) ×1 IMPLANT
GUIDEWIRE PRESSURE COMET II (WIRE) ×2 IMPLANT
INQWIRE 1.5J .035X260CM (WIRE) ×2
KIT ESSENTIALS PG (KITS) ×2 IMPLANT
KIT HEART LEFT (KITS) ×2 IMPLANT
PACK CARDIAC CATHETERIZATION (CUSTOM PROCEDURE TRAY) ×2 IMPLANT
TRANSDUCER W/STOPCOCK (MISCELLANEOUS) ×2 IMPLANT
TUBING CIL FLEX 10 FLL-RA (TUBING) ×2 IMPLANT

## 2020-03-04 NOTE — Discharge Instructions (Signed)
Radial Site Care  This sheet gives you information about how to care for yourself after your procedure. Your health care provider may also give you more specific instructions. If you have problems or questions, contact your health care provider. What can I expect after the procedure? After the procedure, it is common to have:  Bruising and tenderness at the catheter insertion area. Follow these instructions at home: Medicines  Take over-the-counter and prescription medicines only as told by your health care provider. Insertion site care 1. Follow instructions from your health care provider about how to take care of your insertion site. Make sure you: ? Wash your hands with soap and water before you remove your bandage (dressing). If soap and water are not available, use hand sanitizer. ? May remove dressing in 24 hours. 2. Check your insertion site every day for signs of infection. Check for: ? Redness, swelling, or pain. ? Fluid or blood. ? Pus or a bad smell. ? Warmth. 3. Do no take baths, swim, or use a hot tub for 5 days. 4. You may shower 24-48 hours after the procedure. ? Remove the dressing and gently wash the site with plain soap and water. ? Pat the area dry with a clean towel. ? Do not rub the site. That could cause bleeding. 5. Do not apply powder or lotion to the site. Activity  1. For 24 hours after the procedure, or as directed by your health care provider: ? Do not flex or bend the affected arm. ? Do not push or pull heavy objects with the affected arm. ? Do not drive yourself home from the hospital or clinic. You may drive 24 hours after the procedure. ? Do not operate machinery or power tools. ? KEEP ARM ELEVATED THE REMAINDER OF THE DAY. 2. Do not push, pull or lift anything that is heavier than 10 lb for 5 days. 3. Ask your health care provider when it is okay to: ? Return to work or school. ? Resume usual physical activities or sports. ? Resume sexual  activity. General instructions  If the catheter site starts to bleed, raise your arm and put firm pressure on the site. If the bleeding does not stop, get help right away. This is a medical emergency.  DRINK PLENTY OF FLUIDS FOR THE NEXT 2-3 DAYS.  No alcohol consumption for 24 hours after receiving sedation.  If you went home on the same day as your procedure, a responsible adult should be with you for the first 24 hours after you arrive home.  Keep all follow-up visits as told by your health care provider. This is important. Contact a health care provider if:  You have a fever.  You have redness, swelling, or yellow drainage around your insertion site. Get help right away if:  You have unusual pain at the radial site.  The catheter insertion area swells very fast.  The insertion area is bleeding, and the bleeding does not stop when you hold steady pressure on the area.  Your arm or hand becomes pale, cool, tingly, or numb. These symptoms may represent a serious problem that is an emergency. Do not wait to see if the symptoms will go away. Get medical help right away. Call your local emergency services (911 in the [place] Do not drive yourself to the hospital. Summary  After the procedure, it is common to have bruising and tenderness at the site.  Follow instructions from your health care provider about how to take care   of your radial site wound. Check the wound every day for signs of infection.  This information is not intended to replace advice given to you by your health care provider. Make sure you discuss any questions you have with your health care provider. Document Revised: 02/20/2017 Document Reviewed: 02/20/2017 Elsevier Patient Education  2020 Elsevier Inc. 

## 2020-03-04 NOTE — Interval H&P Note (Signed)
History and Physical Interval Note:  03/04/2020 7:32 AM  Tommy Malone.  has presented today for surgery, with the diagnosis of chest pain- angina  The various methods of treatment have been discussed with the patient and family. After consideration of risks, benefits and other options for treatment, the patient has consented to  Procedure(s): LEFT HEART CATH AND CORONARY ANGIOGRAPHY (N/A)  PERCUTANEOUS CORONARY INTERVENTION  as a surgical intervention.  The patient's history has been reviewed, patient examined, no change in status, stable for surgery.  I have reviewed the patient's chart and labs.  Questions were answered to the patient's satisfaction.    Cath Lab Visit (complete for each Cath Lab visit)  Clinical Evaluation Leading to the Procedure:   ACS: No.  Non-ACS:    Anginal Classification: CCS III  Anti-ischemic medical therapy: Minimal Therapy (1 class of medications)  Non-Invasive Test Results: No non-invasive testing performed  Prior CABG: No previous CABG   Bryan Lemma

## 2020-03-07 ENCOUNTER — Telehealth: Payer: Self-pay | Admitting: Internal Medicine

## 2020-03-07 LAB — POCT ACTIVATED CLOTTING TIME
Activated Clotting Time: 255 seconds
Activated Clotting Time: 273 seconds

## 2020-03-07 NOTE — Telephone Encounter (Signed)
Patient states he is returning a call, received today, regarding his cath on 03/04/20. Please call.

## 2020-03-07 NOTE — Telephone Encounter (Addendum)
I did not call patient today. I  called patient Thursday 03/03/20 to review procedure instructions  for 03/04/20, was not able to reach him on 03/03/20 and left message for him to call me. Call placed to patient today to let him know I called 03/03/20 and not today,  unable to reach him, voicemail message.

## 2020-03-07 NOTE — Telephone Encounter (Signed)
LHC done 03/04/20

## 2020-03-08 ENCOUNTER — Other Ambulatory Visit: Payer: Self-pay | Admitting: Nurse Practitioner

## 2020-03-09 NOTE — Progress Notes (Signed)
REviewed cath findings with Dr Herbie Baltimore Pt iwht known CAD but none nothing appears flow limiting  He would recomm pushing medical Rx   COuld try very low dose amlodipine 1.25 mg  (he is taking sildenafil no and then which is a contraindication to Imdur or NTG) May consider other meds instead WOuld add Zetia to get his LDL down lower   F?U lipids in 2 months F?U in clinic in 3 to 4 wks to see how doing

## 2020-03-11 ENCOUNTER — Telehealth: Payer: Self-pay | Admitting: Internal Medicine

## 2020-03-11 ENCOUNTER — Other Ambulatory Visit: Payer: Self-pay | Admitting: *Deleted

## 2020-03-11 DIAGNOSIS — Z9861 Coronary angioplasty status: Secondary | ICD-10-CM

## 2020-03-11 DIAGNOSIS — I2510 Atherosclerotic heart disease of native coronary artery without angina pectoris: Secondary | ICD-10-CM

## 2020-03-11 NOTE — Telephone Encounter (Signed)
Patient is returning Michalene's call. Please advise. 

## 2020-03-11 NOTE — Telephone Encounter (Signed)
REviewed cath findings with Dr Herbie Baltimore Pt iwht known CAD but none nothing appears flow limiting  He would recomm pushing medical Rx 1.   COuld try very low dose amlodipine 1.25 mg  (he is taking sildenafil no and then which is a contraindication to Imdur or NTG) 2. May consider other meds instead 3. WOuld add Zetia to get his LDL down lower   F?U lipids in 2 months F?U in clinic in 3 to 4 wks to see how doing  ____________________________________________________________  Left message for patient to call back.

## 2020-03-11 NOTE — Telephone Encounter (Signed)
Spoke with patient but he was at work, loud, unable to talk.  He is going to call back just before 5pm today.  He was prescribed imdur and one other medication at the hospital but he doesn't know the name of it.

## 2020-03-11 NOTE — Telephone Encounter (Signed)
See additional telephone encounter dated 03/11/20.

## 2020-03-15 NOTE — Telephone Encounter (Signed)
Patient calling back.   °

## 2020-03-17 NOTE — Telephone Encounter (Signed)
Follow Up:      Pt is returning Michalen's call. This is concerning his medicine and his procedure.

## 2020-03-17 NOTE — Telephone Encounter (Signed)
Left message for patient to call back  

## 2020-03-17 NOTE — Telephone Encounter (Signed)
Left VM that I will call him back later today.

## 2020-03-21 ENCOUNTER — Other Ambulatory Visit: Payer: Self-pay | Admitting: Nurse Practitioner

## 2020-03-31 ENCOUNTER — Encounter: Payer: Self-pay | Admitting: Internal Medicine

## 2020-03-31 ENCOUNTER — Ambulatory Visit (INDEPENDENT_AMBULATORY_CARE_PROVIDER_SITE_OTHER): Payer: BC Managed Care – PPO | Admitting: Internal Medicine

## 2020-03-31 ENCOUNTER — Other Ambulatory Visit: Payer: Self-pay

## 2020-03-31 VITALS — BP 108/68 | HR 71 | Ht 69.0 in | Wt 175.2 lb

## 2020-03-31 DIAGNOSIS — I2510 Atherosclerotic heart disease of native coronary artery without angina pectoris: Secondary | ICD-10-CM | POA: Diagnosis not present

## 2020-03-31 MED ORDER — POTASSIUM CHLORIDE ER 10 MEQ PO TBCR: 10.0000 meq | EXTENDED_RELEASE_TABLET | Freq: Every day | ORAL | 1 refills | Status: DC

## 2020-03-31 MED ORDER — FUROSEMIDE 20 MG PO TABS: 40.0000 mg | ORAL_TABLET | ORAL | 1 refills | Status: DC

## 2020-03-31 NOTE — Patient Instructions (Signed)
Start lasix 40 mg with 10 mEq KCL   Take 1x per week  Will contact you about approval for Ranexa  Continue other meds   F/U in clinic in 6 weeks with Dr Tenny Craw or one of our associated APPs.

## 2020-03-31 NOTE — Progress Notes (Unsigned)
Cardiology Office Note   Date:  03/31/2020   ID:  Tommy Malone., DOB 29-Aug-1965, MRN [id number]  PCP:  Renford Dills, MD  Cardiologist:   Dietrich Pates, MD   F/U of CAD      History of Present Illness: Tommy Malone. is a 55 y.o. male with a  history of tobacco abuse, hyperlipidemia,  PAD (s/p aortobifemoral bypass graft  12/2012)  bilateral carotid artery disease . And CAD   Myoview stress test in 2014 was normal without ischemia, LVEF was normal 57%.   Cardiac catheterization was performed on 12/18 which showed 99% proximal LAD lesion treated with DES (Xience 3.5 x 33 mm DES), 60% D2 stenosis, 70% proximal to mid LAD lesion covered with same stent, 50% mid RCA lesion, 30% distal RCA lesion, EF 45-50%. Postprocedure, he was enrolled in [place] trial and placed on aspirin and Brilinta. Echocardiogram was obtained on 12/9 which showed EF 55-60%, biplane LVEF 67% by speckle tracking, not all myocardial segments were well visualized, normal diastolic function, unable to assess PASP.   I saw the pt in late Jan 2022   At that time he complained of chest tightness with activity and SOB   Similar to what he experienced prior to last cath Based on this the pt went on to have L heart cath.   This showed:   There is mild to moderate left ventricular systolic dysfunction. The left ventricular ejection fraction is 45-50% by visual estimate -mid to apical inferior and anterior hypokinesis  LV end diastolic pressure is moderately elevated.  ----------------------------------------------  Mid LAD stent is 65% stenosed. 2nd Diag lesion is 40% stenosed. 2nd Sept lesion is 80% stenosed. -> DFR of LAD 0.93, FFR 0.82-nonsignificant.  2nd Diag lesion is 40% stenosed -> marking the exit point of an intramyocardial segment.  Prox RCA to Dist RCA lesion is 100% stenosed -> fills via left to right collaterals from  Mid Cx to Dist Cx lesion is 55% stenosed.  Mid LAD to Dist LAD lesion is 20%  stenosed.   SUMMARY  Severe native CAD involving RCA and LAD: New 100% CTO of ostial RCA with extensive L-R collaterals from  2stSep, 60% ISR of mid LAD (DFR/FFR negative) with jailed 2ndDiag ostial 40% stenosis & major 2stSep 80%; moderate proximal LCx 55% (DFR 0.95) ? DFR prox LCx - 0.95 (not significant) ? DFR mid LAD ISR 0.93 --FFR 0.82 (borderline, but nonsignificant)  The LAD beyond the stented segment does appear to go some myocardial with myocardial bridging noted at the distal exit point  Mild to moderately reduced LVEF of roughly 45% with inferior and apical hypokinesis -> would recommend 2D echocardiogram to better evaluate..  Suspect likely culprit angina is the occluded RCA being filled via collaterals from a heavily diseased ostial major 2st Sep branch.   The pt continues to have episodes of pressure      Outpatient Medications Prior to Visit  Medication Sig Dispense Refill  . acetaminophen (TYLENOL) 500 MG tablet Take 1,000 mg by mouth every 8 (eight) hours as needed for moderate pain.    [name] aspirin EC 81 MG tablet Take 81 mg by mouth daily.    [name] atorvastatin (LIPITOR) 80 MG tablet TAKE 1 TABLET BY MOUTH EVERY DAY 90 tablet 3  . clopidogrel (PLAVIX) 75 MG tablet Take 75 mg by mouth daily.    . isosorbide mononitrate (IMDUR) 30 MG 24 hr tablet Take 1 tablet (30 mg total) by mouth at  bedtime. 30 tablet 11  . metoprolol succinate (TOPROL-XL) 25 MG 24 hr tablet TAKE 0.5 TABLETS (12.5 MG TOTAL) BY MOUTH 2 (TWO) TIMES DAILY. 90 tablet 2  . nitroGLYCERIN (NITROSTAT) 0.4 MG SL tablet Place 1 tablet (0.4 mg total) under the tongue every 5 (five) minutes as needed for chest pain (x 3 doses). 25 tablet 2  . omeprazole (PRILOSEC) 20 MG capsule TAKE 1 CAPSULE BY MOUTH EVERY DAY 90 capsule 3  . sertraline (ZOLOFT) 100 MG tablet Take 1 tablet (100 mg total) by mouth daily. Patient will have to get future refills from PCP. Provider will no longer be in office. 45 tablet 0  . sildenafil  (REVATIO) 20 MG tablet Take 60 mg by mouth daily as needed (ED).    . ticagrelor (BRILINTA) 90 MG TABS tablet Take 1 tablet (90 mg total) by mouth 2 (two) times daily. 180 tablet 3  . atorvastatin (LIPITOR) 80 MG tablet Take 80 mg by mouth daily. (Patient not taking: Reported on 03/31/2020)     No facility-administered medications prior to visit.     Allergies:   Simvastatin   Past Medical History:  Diagnosis Date  . Acid reflux   . Anxiety   . CAD S/P percutaneous coronary angioplasty 01/06/2015   a. NSTEMI - Xience DES 3.5 x 33 p-mLAD; 60% D1, 50% mRCA   b. 95% ost D2 treated with suboptimal PTCA with 55% residual stenosis, 55% mid LAD, 55% mid LAD ISR treated with PTCA, 99% ost 1st septal perforator, 50% mid RCA  . Depression   . Hemorrhoid   . History of kidney stones   . Hyperlipidemia   . NSTEMI (non-ST elevated myocardial infarction) (HCC) 12/2014  . Peripheral vascular disease [facility])     Past Surgical History:  Procedure Laterality Date  . ABDOMINAL AORTAGRAM N/A 12/18/2012   Procedure: ABDOMINAL AORTAGRAM;  Surgeon: Fransisco Hertz, MD;  Location: [facility] CATH LAB;  Service: Cardiovascular;  Laterality: N/A;  . AORTA - BILATERAL FEMORAL ARTERY BYPASS GRAFT N/A 01/07/2013   Procedure: AORTA BIFEMORAL BYPASS GRAFT;  Surgeon: Fransisco Hertz, MD;  Location: [facility] OR;  Service: Vascular;  Laterality: N/A;  . CARDIAC CATHETERIZATION N/A 01/06/2015   Procedure: Left Heart Cath and Coronary Angiography;  Surgeon: Kathleene Hazel, MD;  Location: [facility] INVASIVE CV LAB;  Service: Cardiovascular;  Laterality: N/A;  . CARDIAC CATHETERIZATION N/A 01/06/2015   Procedure: Coronary Stent Intervention;  Surgeon: Kathleene Hazel, MD;  Location: [facility] INVASIVE CV LAB;  Service: Cardiovascular: Xience DES 3.5 mm x 33 mm p-mLAD  . CARDIAC CATHETERIZATION N/A 07/02/2015   Procedure: Left Heart Cath and Coronary Angiography;  Surgeon: Marykay Lex, MD;  Location: [facility] INVASIVE CV LAB;  Service:  Cardiovascular;  Laterality: N/A;  . CARDIAC CATHETERIZATION N/A 07/02/2015   Procedure: Coronary Stent Intervention;  Surgeon: Marykay Lex, MD;  Location: [facility] INVASIVE CV LAB;  Service: Cardiovascular;  Laterality: N/A;  . ELBOW SURGERY Bilateral   . INTRAVASCULAR PRESSURE WIRE/FFR STUDY N/A 03/04/2020   Procedure: INTRAVASCULAR PRESSURE WIRE/FFR STUDY;  Surgeon: Marykay Lex, MD;  Location: [facility] INVASIVE CV LAB;  Service: Cardiovascular;  Laterality: N/A;  . KIDNEY STONE SURGERY     x2  . LEFT HEART CATH AND CORONARY ANGIOGRAPHY N/A 03/04/2020   Procedure: LEFT HEART CATH AND CORONARY ANGIOGRAPHY;  Surgeon: Marykay Lex, MD;  Location: [facility] INVASIVE CV LAB;  Service: Cardiovascular;  Laterality: N/A;     Social History:  The patient  reports  that he has been smoking cigarettes and e-cigarettes. He has a 30.00 pack-year smoking history. He has never used smokeless tobacco. He reports that he does not drink alcohol and does not use drugs.   Family History:  The patient's family history includes Cancer in his paternal grandfather; Heart attack in his father and mother; Heart disease in his father, maternal grandmother, and mother; Hyperlipidemia in his father and mother.    ROS:  Please see the history of present illness. All other systems are reviewed and  Negative to the above problem except as noted.    PHYSICAL EXAM: VS:  BP 108/68   Pulse 71   Ht 5\' 9"  (1.753 m)   Wt 175 lb 3.2 oz (79.5 kg)   SpO2 97%   BMI 25.87 kg/m   GEN: Well nourished, well developed, in no acute distress  HEENT: normal  Neck: no JVD, carotid bruits, Cardiac: RRR; no murmurs, No LE  edema  Respiratory:  clear to auscultation bilaterally, GI: soft, nontender, nondistended, + BS  No hepatomegaly  MS: no deformity Moving all extremities   Skin: warm and dry, no rash Neuro:  CN II to XII intact Psych: euthymic mood, full affect   EKG:  EKG is not done   Lipid Panel    Component Value Date/Time    CHOL 143 02/23/2020 1211   TRIG 176 (H) 02/23/2020 1211   HDL 37 (L) 02/23/2020 1211   CHOLHDL 3.9 02/23/2020 1211   CHOLHDL 3.4 07/03/2015 0530   VLDL 14 07/03/2015 0530   LDLCALC 76 02/23/2020 1211      Wt Readings from Last 3 Encounters:  03/31/20 175 lb 3.2 oz (79.5 kg)  03/04/20 165 lb (74.8 kg)  02/23/20 174 lb 12.8 oz (79.3 kg)      ASSESSMENT AND PLAN:  1  CAD Pt with severe CAD  Just had cath noted above LVEDP at cath was 25    Note CTO of RCA with collaterals I would recomm lasix 40 mg with 10 KCL 1x per week   Follow symptoms   If improve would increase to 2x per wk Stop Brilinta and take Plavix Will look into Ranexa 500 bid     Check coverage  2.  PVOD Last ABIs in APril 2021 were mildly abnormal on L   Normal R   Carotid USN in 04/2019:  mod plaquing bilaterally    Will  follow with periodic USNs  3.  HL  Last lipid panel LDL was 76  HDL 37   Follow for now on current regimen Watch diet    4.  Tobacco  Still smoking  Again, counselled on cessation   F/U in 6 wks         Disposition:   FU with me in 4 to 5 months    Signed, 05/2019, MD  03/31/2020 4:47 PM    [facility] [address] [place], [place], [place]  [place] Phone: [phone]; Fax: [phone]

## 2020-04-01 NOTE — Telephone Encounter (Signed)
Pt seen by Dr. Tenny Craw 03/31/20.  Refer to office visit notes for details.

## 2020-04-26 ENCOUNTER — Other Ambulatory Visit: Payer: Self-pay | Admitting: Internal Medicine

## 2020-04-27 MED ORDER — POTASSIUM CHLORIDE ER 10 MEQ PO TBCR: 10.0000 meq | EXTENDED_RELEASE_TABLET | Freq: Every day | ORAL | 3 refills | Status: DC

## 2020-04-28 ENCOUNTER — Other Ambulatory Visit: Payer: Self-pay

## 2020-05-06 DIAGNOSIS — I2510 Atherosclerotic heart disease of native coronary artery without angina pectoris: Secondary | ICD-10-CM | POA: Diagnosis not present

## 2020-05-06 DIAGNOSIS — N529 Male erectile dysfunction, unspecified: Secondary | ICD-10-CM | POA: Diagnosis not present

## 2020-05-06 DIAGNOSIS — E7800 Pure hypercholesterolemia, unspecified: Secondary | ICD-10-CM | POA: Diagnosis not present

## 2020-05-06 DIAGNOSIS — F3341 Major depressive disorder, recurrent, in partial remission: Secondary | ICD-10-CM | POA: Diagnosis not present

## 2020-05-23 ENCOUNTER — Other Ambulatory Visit: Payer: Self-pay

## 2020-05-23 ENCOUNTER — Ambulatory Visit (INDEPENDENT_AMBULATORY_CARE_PROVIDER_SITE_OTHER): Payer: BC Managed Care – PPO | Admitting: Physician Assistant

## 2020-05-23 ENCOUNTER — Encounter: Payer: Self-pay | Admitting: Physician Assistant

## 2020-05-23 VITALS — BP 98/60 | HR 64 | Ht 69.0 in | Wt 178.2 lb

## 2020-05-23 DIAGNOSIS — I25119 Atherosclerotic heart disease of native coronary artery with unspecified angina pectoris: Secondary | ICD-10-CM

## 2020-05-23 DIAGNOSIS — E7800 Pure hypercholesterolemia, unspecified: Secondary | ICD-10-CM

## 2020-05-23 DIAGNOSIS — I6523 Occlusion and stenosis of bilateral carotid arteries: Secondary | ICD-10-CM

## 2020-05-23 DIAGNOSIS — I739 Peripheral vascular disease, unspecified: Secondary | ICD-10-CM

## 2020-05-23 DIAGNOSIS — I5032 Chronic diastolic (congestive) heart failure: Secondary | ICD-10-CM

## 2020-05-23 DIAGNOSIS — Z720 Tobacco use: Secondary | ICD-10-CM

## 2020-05-23 MED ORDER — TICAGRELOR 60 MG PO TABS: 60.0000 mg | ORAL_TABLET | Freq: Two times a day (BID) | ORAL | 2 refills | Status: DC

## 2020-05-23 MED ORDER — FUROSEMIDE 40 MG PO TABS
ORAL_TABLET | ORAL | 9 refills | Status: DC
Start: 2020-05-23 — End: 2022-09-13

## 2020-05-23 NOTE — Progress Notes (Signed)
Cardiology Office Note:    Date:  05/23/2020   ID:  Tommy Capers., DOB 1965-02-10, MRN [id number]  PCP:  Renford Dills, MD   [facility]  Cardiologist:  Dietrich Pates, MD   Electrophysiologist:  None       Referring MD: Renford Dills, MD   Chief Complaint:  Follow-up (CAD, CHF)    Patient Profile:    Tommy Mosqueda. is a 55 y.o. male with:   Coronary artery disease   NSTEMI 12/16 S/p Xience DES to pLAD   STEMI 6/17 s/p unsuccessful POBA of D2 (jailed)  Cath 2/22: oRCA 100 CTO; mLAD 65 ISR (DFR 0.93); jailed D2, 2nd sept 80; pLCx 55 (DFR 0.95) >> Med Rx  Peripheral arterial disease  S/p aorto-bifem bypass in 12/14  Carotid artery disease    [place] 4/21: Bilat ICA 40-59 (followed by [facility])  Tobacco abuse  Hyperlipidemia   Prior CV studies: LEFT HEART CATH  03/04/2020 Narrative  EF 45-50%   ----------------------------------------------  Mid LAD stent is 65% stenosed. Mid LAD to Dist LAD lesion is 20% stenosed.  2nd Diag lesion is 40% stenosed. 2nd Sept lesion is 80% stenosed. -> DFR of LAD 0.93, FFR 0.82-nonsignificant.  2nd Diag lesion is 40% stenosed -> marking the exit point of an intramyocardial segment.  Prox RCA to Dist RCA lesion is 100% stenosed -> fills via left to right collaterals from  Mid Cx to Dist Cx lesion is 55% stenosed. SUMMARY  Severe native CAD involving RCA and LAD: New 100% CTO of ostial RCA with extensive L-R collaterals from  2stSep, 60% ISR of mid LAD (DFR/FFR negative) with jailed 2ndDiag ostial 40% stenosis & major 2stSep 80%; moderate proximal LCx 55% (DFR 0.95)  DFR prox LCx - 0.95 (not significant)  DFR mid LAD ISR 0.93 --FFR 0.82 (borderline, but nonsignificant)  The LAD beyond the stented segment does appear to go some myocardial with myocardial bridging noted at the distal exit point  Mild to moderately reduced LVEF of roughly 45% with inferior and apical hypokinesis -> would recommend 2D  echocardiogram to better evaluate..  Suspect likely culprit angina is the occluded RCA being filled via collaterals from a heavily diseased ostial major 2st Sep branch.     VAS US CAROTID DUPLEX BILATERAL 05/15/2019 Summary: Right Carotid: Velocities in the right ICA are consistent with a 40-59% stenosis. Left Carotid: Velocities in the left ICA are consistent with a 40-59% stenosis. Increased velocity in the left subclavian artery.  ABIs 05/15/19 R 1.11; L 0.99  Echocardiogram 07/04/2015 EF 60-65, no RWMA, normal RVSF, trivial TR  NM Myocar Multi W/Spect W/Wall Motion / EF 02/09/2015 IMPRESSION: 1. No reversible ischemia. Somewhat short septal wall could be from a prior infarct along the septal base, or may be incidental; this is not changed 01/06/2013. Suspected diaphragmatic attenuation causing reduced activity in the inferior wall on rest images. 2. Mild septal hypokinesis. 3. Left ventricular ejection fraction 59% 4. Low-risk stress test findings*.  History of Present Illness: Tommy Malone was last seen by Dr. Tenny Craw 03/31/20.  He had undergone cardiac catheterization for anginal symptoms.  He was noted to chronic occlusion of the RCA.  He had mod ISR of the LAD stent and mod dz in the LCx.  Neither lesion in the LAD or LCx was significant.  He was started on Isosorbide.  When he saw Dr. Tenny Craw, she placed him on Furosemide 1 x per week given elevated LVEDP at the time  of his cath.  Of note, his EF was 45-50 at his cath as well.  Approval for ranolazine is pending.    He returns for f/u.  He is here alone.  Overall, he has been doing well without recurrent chest symptoms.  His breathing has improved.  He still gets short of breath with some activities.  His work is fairly labor-intensive.  He makes Government social research officer.  He has not had orthopnea, leg edema or syncope.        Past Medical History:  Diagnosis Date  . Acid reflux   . Anxiety   . CAD S/P percutaneous coronary angioplasty 01/06/2015    a. NSTEMI - Xience DES 3.5 x 33 p-mLAD; 60% D1, 50% mRCA   b. 95% ost D2 treated with suboptimal PTCA with 55% residual stenosis, 55% mid LAD, 55% mid LAD ISR treated with PTCA, 99% ost 1st septal perforator, 50% mid RCA  . Depression   . Hemorrhoid   . History of kidney stones   . Hyperlipidemia   . NSTEMI (non-ST elevated myocardial infarction) (HCC) 12/2014  . Peripheral vascular disease (HCC)     Current Medications: Current Meds  Medication Sig  . acetaminophen (TYLENOL) 500 MG tablet Take 1,000 mg by mouth every 8 (eight) hours as needed for moderate pain.  [name] aspirin EC 81 MG tablet Take 81 mg by mouth daily.  [name] atorvastatin (LIPITOR) 80 MG tablet TAKE 1 TABLET BY MOUTH EVERY DAY  . isosorbide mononitrate (IMDUR) 30 MG 24 hr tablet Take 1 tablet (30 mg total) by mouth at bedtime.  . metoprolol succinate (TOPROL-XL) 25 MG 24 hr tablet TAKE 0.5 TABLETS (12.5 MG TOTAL) BY MOUTH 2 (TWO) TIMES DAILY.  . nitroGLYCERIN (NITROSTAT) 0.4 MG SL tablet Place 1 tablet (0.4 mg total) under the tongue every 5 (five) minutes as needed for chest pain (x 3 doses).  . pantoprazole (PROTONIX) 40 MG tablet Take 1 tablet by mouth every morning.  . potassium chloride (KLOR-CON) 10 MEQ tablet Take 1 tablet (10 mEq total) by mouth daily.  . sertraline (ZOLOFT) 100 MG tablet Take 1 tablet (100 mg total) by mouth daily. Patient will have to get future refills from PCP. Provider will no longer be in office.  . sildenafil (REVATIO) 20 MG tablet Take 60 mg by mouth daily as needed (ED).  . varenicline (CHANTIX) 1 MG tablet Take 1 mg by mouth 2 (two) times daily.  . [DISCONTINUED] clopidogrel (PLAVIX) 75 MG tablet Take 75 mg by mouth daily.  . [DISCONTINUED] furosemide (LASIX) 20 MG tablet Take 2 tablets (40 mg total) by mouth once a week.  . [DISCONTINUED] ticagrelor (BRILINTA) 90 MG TABS tablet Take 1 tablet (90 mg total) by mouth 2 (two) times daily.     Allergies:   Simvastatin   Social History   Tobacco  Use  . Smoking status: Current Every Day Smoker    Packs/day: 1.00    Years: 30.00    Pack years: 30.00    Types: Cigarettes, E-cigarettes  . Smokeless tobacco: Never Used  Vaping Use  . Vaping Use: Never used  Substance Use Topics  . Alcohol use: No    Comment: occasional  . Drug use: No     Family Hx: The patient's family history includes Cancer in his paternal grandfather; Heart attack in his father and mother; Heart disease in his father, maternal grandmother, and mother; Hyperlipidemia in his father and mother.  ROS - See HPI  EKGs/Labs/Other Test Reviewed:  EKG:  EKG is not ordered today.  The ekg ordered today demonstrates n/a  Recent Labs: 02/23/2020: BUN 14; Creatinine, Ser 1.03; Hemoglobin 11.6; Platelets 208; Potassium 4.6; Sodium 138; TSH 2.190   Recent Lipid Panel Lab Results  Component Value Date/Time   CHOL 143 02/23/2020 12:11 PM   TRIG 176 (H) 02/23/2020 12:11 PM   HDL 37 (L) 02/23/2020 12:11 PM   CHOLHDL 3.9 02/23/2020 12:11 PM   CHOLHDL 3.4 07/03/2015 05:30 AM   LDLCALC 76 02/23/2020 12:11 PM    Risk Assessment/Calculations:      Physical Exam:    VS:  BP 98/60   Pulse 64   Ht 5\' 9"  (1.753 m)   Wt 178 lb 3.4 oz (80.8 kg)   SpO2 97%   BMI 26.32 kg/m     Wt Readings from Last 3 Encounters:  05/23/20 178 lb 3.4 oz (80.8 kg)  03/31/20 175 lb 3.2 oz (79.5 kg)  03/04/20 165 lb (74.8 kg)     Constitutional:      Appearance: Healthy appearance. Not in distress.  Neck:     Vascular: JVD normal.  Pulmonary:     Effort: Pulmonary effort is normal.     Breath sounds: No wheezing. No rales.  Cardiovascular:     Normal rate. Regular rhythm. Normal S1. Normal S2.     Murmurs: There is no murmur.  Edema:    Peripheral edema absent.  Abdominal:     Palpations: Abdomen is soft. There is no hepatomegaly.  Skin:    General: Skin is warm and dry.  Neurological:     General: No focal deficit present.     Mental Status: Alert and oriented to  person, place and time.     Cranial Nerves: Cranial nerves are intact.         ASSESSMENT & PLAN:    1. Coronary artery disease involving native coronary artery of native heart with angina pectoris (HCC) History of non-STEMI in 2016 treated with DES to the LAD.  He had a STEMI in 2017 and an angioplasty of the jailed second diagonal was unsuccessful.  Most recent cardiac catheterization demonstrated a chronically occluded RCA with moderate disease in the LCx and moderate in-stent restenosis in the LAD.  Both lesions in the LAD and LCx were not hemodynamically significant by DFR.  Medical therapy was recommended.  He is currently doing well without anginal symptoms on his current regimen.  He has been on ticagrelor 90 mg twice daily for years.  We discussed switching to Plavix versus decreasing the dose of ticagrelor to 60 mg twice daily.  He prefers to remain on ticagrelor.  -Continue aspirin, atorvastatin, isosorbide, metoprolol succinate.    -Decrease Ticagrelor from 90 to 60 mg twice daily   2. Chronic heart failure with preserved ejection fraction (HCC) EF by echocardiogram in 2017 was 60-65.  EF was 45-50 during recent cardiac catheterization.  His LVEDP was also elevated.  Dr. Tenny Crawoss placed him on furosemide at last visit.  Since taking this once weekly, he does note some improvement in his shortness of breath.  He still does have dyspnea with some activities.  I have asked him to try taking furosemide 40 mg twice weekly.  If he has no significant improvement, he can resume once weekly dosing.  Obtain follow-up BMET in 2 weeks.  3. Pure hypercholesterolemia Most recent LDL was 76 on his current dose of atorvastatin.  Continue current therapy.  Arrange follow-up lipids and CMET prior to  next visit.  If LDL remains above 70, consider adding ezetimibe to his medical regimen.  4. Bilateral carotid artery stenosis Bilateral 40-59% stenosis by ultrasound in 4/21.  He is followed by vascular surgery  and has an appointment next month.  Continue aspirin, statin.  5. Peripheral Vascular Disease  Status post aortobifemoral bypass in 2014.  Continue follow-up with vascular surgery as planned.  6. Tobacco abuse He is currently on Chantix and is down to 2 to 3 cigarettes/day.  He plans to quit soon    Dispo:  Return in about 3 months (around 08/22/2020) for Routine Follow Up, w/ Dr. Tenny Craw, or Tereso Newcomer, PA-C, in person.   Medication Adjustments/Labs and Tests Ordered: Current medicines are reviewed at length with the patient today.  Concerns regarding medicines are outlined above.  Tests Ordered: Orders Placed This Encounter  Procedures  . Basic Metabolic Panel (BMET)  . ECHOCARDIOGRAM COMPLETE   Medication Changes: Meds ordered this encounter  Medications  . ticagrelor (BRILINTA) 60 MG TABS tablet    Sig: Take 1 tablet (60 mg total) by mouth 2 (two) times daily.    Dispense:  180 tablet    Refill:  2  . furosemide (LASIX) 40 MG tablet    Sig: Take one tablet by mouth ( 40 mg) twice weekly.    Dispense:  24 tablet    Refill:  [address] Tereso Newcomer, [place]  05/23/2020 4:55 PM    [facility] [address] [place], [place], [place]  [address] Phone: [phone]; Fax: [phone]

## 2020-05-23 NOTE — Patient Instructions (Signed)
Medication Instructions:  Your physician has recommended you make the following change in your medication:   1. Change Brilinta to one tablet by mouth ( 60 mg) twice daily, start after you finish your ( 90 mg) twice daily tablets.   2.  Change Lasix to one tablet by mouth ( 40 mg) twice weekly. Both medications sent in to requested pharmacy # [id number].   *If you need a refill on your cardiac medications before your next appointment, please call your pharmacy*   Lab Work: Your physician recommends that you return for lab work in on Monday, May 9 between 7:30 - 4:30.   If you have labs (blood work) drawn today and your tests are completely normal, you will receive your results only by: [name] MyChart Message (if you have MyChart) OR . A paper copy in the mail If you have any lab test that is abnormal or we need to change your treatment, we will call you to review the results.   Testing/Procedures: Your physician has requested that you have an echocardiogram.Thursday, May 26 @ 4:05 pm.  Echocardiography is a painless test that uses sound waves to create images of your heart. It provides your doctor with information about the size and shape of your heart and how well your heart's chambers and valves are working. This procedure takes approximately one hour. There are no restrictions for this procedure.   Echocardiogram An echocardiogram is a test that uses sound waves (ultrasound) to produce images of the heart. Images from an echocardiogram can provide important information about:  Heart size and shape.  The size and thickness and movement of your heart's walls.  Heart muscle function and strength.  Heart valve function or if you have stenosis. Stenosis is when the heart valves are too narrow.  If blood is flowing backward through the heart valves (regurgitation).  A tumor or infectious growth around the heart valves.  Areas of heart muscle that are not working well because of poor blood flow  or injury from a heart attack.  Aneurysm detection. An aneurysm is a weak or damaged part of an artery wall. The wall bulges out from the normal force of blood pumping through the body. Tell a health care provider about:  Any allergies you have.  All medicines you are taking, including vitamins, herbs, eye drops, creams, and over-the-counter medicines.  Any blood disorders you have.  Any surgeries you have had.  Any medical conditions you have.  Whether you are pregnant or may be pregnant. What are the risks? Generally, this is a safe test. However, problems may occur, including an allergic reaction to dye (contrast) that may be used during the test. What happens before the test? No specific preparation is needed. You may eat and drink normally. What happens during the test?  You will take off your clothes from the waist up and put on a hospital gown.  Electrodes or electrocardiogram (ECG)patches may be placed on your chest. The electrodes or patches are then connected to a device that monitors your heart rate and rhythm.  You will lie down on a table for an ultrasound exam. A gel will be applied to your chest to help sound waves pass through your skin.  A handheld device, called a transducer, will be pressed against your chest and moved over your heart. The transducer produces sound waves that travel to your heart and bounce back (or "echo" back) to the transducer. These sound waves will be captured in real-time and  changed into images of your heart that can be viewed on a video monitor. The images will be recorded on a computer and reviewed by your health care provider.  You may be asked to change positions or hold your breath for a short time. This makes it easier to get different views or better views of your heart.  In some cases, you may receive contrast through an IV in one of your veins. This can improve the quality of the pictures from your heart. The procedure may vary  among health care providers and hospitals.   What can I expect after the test? You may return to your normal, everyday life, including diet, activities, and medicines, unless your health care provider tells you not to do that. Follow these instructions at home:  It is up to you to get the results of your test. Ask your health care provider, or the department that is doing the test, when your results will be ready.  Keep all follow-up visits. This is important. Summary  An echocardiogram is a test that uses sound waves (ultrasound) to produce images of the heart.  Images from an echocardiogram can provide important information about the size and shape of your heart, heart muscle function, heart valve function, and other possible heart problems.  You do not need to do anything to prepare before this test. You may eat and drink normally.  After the echocardiogram is completed, you may return to your normal, everyday life, unless your health care provider tells you not to do that. This information is not intended to replace advice given to you by your health care provider. Make sure you discuss any questions you have with your health care provider. Document Revised: 09/08/2019 Document Reviewed: 09/08/2019 Elsevier Patient Education  2021 Elsevier Inc.    Follow-Up: At [facility], you and your health needs are our priority.  As part of our continuing mission to provide you with exceptional heart care, we have created designated Provider Care Teams.  These Care Teams include your primary Cardiologist (physician) and Advanced Practice Providers (APPs -  Physician Assistants and Nurse Practitioners) who all work together to provide you with the care you need, when you need it.  We recommend signing up for the patient portal called "MyChart".  Sign up information is provided on this After Visit Summary.  MyChart is used to connect with patients for Virtual Visits (Telemedicine).  Patients are  able to view lab/test results, encounter notes, upcoming appointments, etc.  Non-urgent messages can be sent to your provider as well.   To learn more about what you can do with MyChart, go to [email or web address].    Your next appointment:   4 month(s)Tuesday, August 16 @ 8:00 with fasting from 12 the night before.   The format for your next appointment:   In Person  Provider:   Dietrich Pates, MD   Other Instructions -None

## 2020-06-01 ENCOUNTER — Other Ambulatory Visit: Payer: Self-pay

## 2020-06-01 ENCOUNTER — Ambulatory Visit (INDEPENDENT_AMBULATORY_CARE_PROVIDER_SITE_OTHER)
Admission: RE | Admit: 2020-06-01 | Discharge: 2020-06-01 | Disposition: A | Discharge status: Home / Self Care | Payer: BC Managed Care – PPO | Source: Ambulatory Visit | Attending: Vascular Surgery | Admitting: Vascular Surgery

## 2020-06-01 ENCOUNTER — Ambulatory Visit (INDEPENDENT_AMBULATORY_CARE_PROVIDER_SITE_OTHER): Payer: BC Managed Care – PPO | Admitting: Physician Assistant

## 2020-06-01 ENCOUNTER — Ambulatory Visit (HOSPITAL_COMMUNITY)
Admission: RE | Admit: 2020-06-01 | Discharge: 2020-06-01 | Disposition: A | Discharge status: Home / Self Care | Payer: BC Managed Care – PPO | Source: Ambulatory Visit | Attending: Vascular Surgery | Admitting: Vascular Surgery

## 2020-06-01 VITALS — BP 122/75 | HR 52 | Temp 98.0°F | Resp 20 | Ht 69.0 in | Wt 175.8 lb

## 2020-06-01 DIAGNOSIS — I739 Peripheral vascular disease, unspecified: Secondary | ICD-10-CM

## 2020-06-01 DIAGNOSIS — I779 Disorder of arteries and arterioles, unspecified: Secondary | ICD-10-CM

## 2020-06-01 DIAGNOSIS — F17200 Nicotine dependence, unspecified, uncomplicated: Secondary | ICD-10-CM | POA: Diagnosis not present

## 2020-06-01 NOTE — Progress Notes (Signed)
HISTORY AND PHYSICAL     CC:  follow up. Requesting Provider:  Renford Dills, MD  HPI: This is a 55 y.o. male who is here today for follow up and is pt of Dr. Randie Heinz and has hx of ABF bypass in 2014 by Dr. Imogene Burn.  He is also followed for bilateral carotid artery stenosis.  He has hx of NSTEMI and STEMI and has hx of DES.  Pt was last seen on April 2021.  At that time, he did not have any claudication or tissue loss.  He was not having any stroke sx.    The pt returns today for follow up.  He denies any stroke sx.  He does have some pain in his legs but no non healing wounds or rest pain.    The pt is on a statin for cholesterol management.    The pt is on an aspirin.    Other AC:  Brilinta The pt is on BB for hypertension.  The pt does not have diabetes. Tobacco hx:  Current, but on chantix trying to quit.    Past Medical History:  Diagnosis Date  . Acid reflux   . Anxiety   . CAD S/P percutaneous coronary angioplasty 01/06/2015   a. NSTEMI - Xience DES 3.5 x 33 p-mLAD; 60% D1, 50% mRCA   b. 95% ost D2 treated with suboptimal PTCA with 55% residual stenosis, 55% mid LAD, 55% mid LAD ISR treated with PTCA, 99% ost 1st septal perforator, 50% mid RCA  . Depression   . Hemorrhoid   . History of kidney stones   . Hyperlipidemia   . NSTEMI (non-ST elevated myocardial infarction) (HCC) 12/2014  . Peripheral vascular disease [facility])     Past Surgical History:  Procedure Laterality Date  . ABDOMINAL AORTAGRAM N/A 12/18/2012   Procedure: ABDOMINAL AORTAGRAM;  Surgeon: Fransisco Hertz, MD;  Location[facility] CATH LAB;  Service: Cardiovascular;  Laterality: N/A;  . AORTA - BILATERAL FEMORAL ARTERY BYPASS GRAFT N/A 01/07/2013   Procedure: AORTA BIFEMORAL BYPASS GRAFT;  Surgeon: Fransisco Hertz, MD;  Location: [facility] OR;  Service: Vascular;  Laterality: N/A;  . CARDIAC CATHETERIZATION N/A 01/06/2015   Procedure: Left Heart Cath and Coronary Angiography;  Surgeon: Kathleene Hazel, MD;  Location: [facility]  INVASIVE CV LAB;  Service: Cardiovascular;  Laterality: N/A;  . CARDIAC CATHETERIZATION N/A 01/06/2015   Procedure: Coronary Stent Intervention;  Surgeon: Kathleene Hazel, MD;  Location: [facility] INVASIVE CV LAB;  Service: Cardiovascular: Xience DES 3.5 mm x 33 mm p-mLAD  . CARDIAC CATHETERIZATION N/A 07/02/2015   Procedure: Left Heart Cath and Coronary Angiography;  Surgeon: Marykay Lex, MD;  Location: [facility] INVASIVE CV LAB;  Service: Cardiovascular;  Laterality: N/A;  . CARDIAC CATHETERIZATION N/A 07/02/2015   Procedure: Coronary Stent Intervention;  Surgeon: Marykay Lex, MD;  Location: [facility] INVASIVE CV LAB;  Service: Cardiovascular;  Laterality: N/A;  . ELBOW SURGERY Bilateral   . INTRAVASCULAR PRESSURE WIRE/FFR STUDY N/A 03/04/2020   Procedure: INTRAVASCULAR PRESSURE WIRE/FFR STUDY;  Surgeon: Marykay Lex, MD;  Location: [facility] INVASIVE CV LAB;  Service: Cardiovascular;  Laterality: N/A;  . KIDNEY STONE SURGERY     x2  . LEFT HEART CATH AND CORONARY ANGIOGRAPHY N/A 03/04/2020   Procedure: LEFT HEART CATH AND CORONARY ANGIOGRAPHY;  Surgeon: Marykay Lex, MD;  Location: [facility] INVASIVE CV LAB;  Service: Cardiovascular;  Laterality: N/A;    Allergies  Allergen Reactions  . Simvastatin Nausea Only    Stomach  aches    Current Outpatient Medications  Medication Sig Dispense Refill  . acetaminophen (TYLENOL) 500 MG tablet Take 1,000 mg by mouth every 8 (eight) hours as needed for moderate pain.    [name] aspirin EC 81 MG tablet Take 81 mg by mouth daily.    [name] atorvastatin (LIPITOR) 80 MG tablet TAKE 1 TABLET BY MOUTH EVERY DAY 90 tablet 3  . furosemide (LASIX) 40 MG tablet Take one tablet by mouth ( 40 mg) twice weekly. 24 tablet 9  . isosorbide mononitrate (IMDUR) 30 MG 24 hr tablet Take 1 tablet (30 mg total) by mouth at bedtime. 30 tablet 11  . metoprolol succinate (TOPROL-XL) 25 MG 24 hr tablet TAKE 0.5 TABLETS (12.5 MG TOTAL) BY MOUTH 2 (TWO) TIMES DAILY. 90 tablet 2  . nitroGLYCERIN  (NITROSTAT) 0.4 MG SL tablet Place 1 tablet (0.4 mg total) under the tongue every 5 (five) minutes as needed for chest pain (x 3 doses). 25 tablet 2  . pantoprazole (PROTONIX) 40 MG tablet Take 1 tablet by mouth every morning.    . potassium chloride (KLOR-CON) 10 MEQ tablet Take 1 tablet (10 mEq total) by mouth daily. 90 tablet 3  . sertraline (ZOLOFT) 100 MG tablet Take 1 tablet (100 mg total) by mouth daily. Patient will have to get future refills from PCP. Provider will no longer be in office. 45 tablet 0  . sildenafil (REVATIO) 20 MG tablet Take 60 mg by mouth daily as needed (ED).    . ticagrelor (BRILINTA) 60 MG TABS tablet Take 1 tablet (60 mg total) by mouth 2 (two) times daily. 180 tablet 2  . varenicline (CHANTIX) 1 MG tablet Take 1 mg by mouth 2 (two) times daily.     No current facility-administered medications for this visit.    Family History  Problem Relation Age of Onset  . Heart disease Mother        NOT  before age 65  . Heart attack Mother   . Hyperlipidemia Mother   . Heart disease Maternal Grandmother   . Cancer Paternal Grandfather   . Heart disease Father        NOT  before age 83  . Heart attack Father   . Hyperlipidemia Father     Social History   Socioeconomic History  . Marital status: Legally Separated    Spouse name: Not on file  . Number of children: Not on file  . Years of education: Not on file  . Highest education level: Not on file  Occupational History  . Not on file  Tobacco Use  . Smoking status: Current Every Day Smoker    Packs/day: 1.00    Years: 30.00    Pack years: 30.00    Types: Cigarettes, E-cigarettes  . Smokeless tobacco: Never Used  Vaping Use  . Vaping Use: Never used  Substance and Sexual Activity  . Alcohol use: No    Comment: occasional  . Drug use: No  . Sexual activity: Not on file  Other Topics Concern  . Not on file  Social History Narrative  . Not on file   Social Determinants of Health   Financial  Resource Strain: Not on file  Food Insecurity: Not on file  Transportation Needs: Not on file  Physical Activity: Not on file  Stress: Not on file  Social Connections: Not on file  Intimate Partner Violence: Not on file     REVIEW OF SYSTEMS:   [X]  denotes positive finding, [ ]   denotes negative finding Cardiac  Comments:  Chest pain or chest pressure:    Shortness of breath upon exertion:    Short of breath when lying flat:    Irregular heart rhythm:        Vascular    Pain in calf, thigh, or hip brought on by ambulation: x   Pain in feet at night that wakes you up from your sleep:     Blood clot in your veins:    Leg swelling:         Pulmonary    Oxygen at home:    Wheezing:         Neurologic    Sudden weakness in arms or legs:     Sudden numbness in arms or legs:     Sudden onset of difficulty speaking or understanding others    Temporary loss of vision in one eye:     Problems with dizziness:         Gastrointestinal    Blood in stool:     Vomited blood:         Genitourinary    Burning when urinating:     Blood in urine:        Psychiatric    Major depression:         Hematologic    Bleeding problems:    Problems with blood clotting too easily:        Skin    Rashes or ulcers:        Constitutional    Fever or chills:      PHYSICAL EXAMINATION:  Today's Vitals   06/01/20 1537 06/01/20 1538  BP: 115/75 122/75  Pulse: (!) 52   Resp: 20   Temp: 98 F (36.7 C)   TempSrc: Temporal   SpO2: 98%   Weight: 175 lb 12.8 oz (79.7 kg)   Height: 5\' 9"  (1.753 m)    Body mass index is 25.96 kg/m.   General:  WDWN in NAD; vital signs documented above Gait: Not observed HENT: WNL, normocephalic Pulmonary: normal non-labored breathing  Cardiac: regular HR;  without carotid bruits Abdomen: soft, NT, aortic pulse is not palpable Skin: without rashes Vascular Exam/Pulses:  Right Left  Radial 2+ (normal) 2+ (normal)  Femoral 2+ (normal) 2+ (normal)   Popliteal Unable to palpate Unable to palpate  DP biphasic monophasic  PT monophasic biphasic   Extremities: without ischemic changes, without Gangrene , without cellulitis; without open wounds;  Musculoskeletal: no muscle wasting or atrophy  Neurologic: A&O X 3;  speech is fluent/normal; moving all extremities equally  Psychiatric:  The pt has Normal affect.   Non-Invasive Vascular Imaging:   ABI's/TBI's on 06/01/2020: Right:  1.08/0.77 - great toe pressure:  82 Left:  0.90/0.64 - great toe pressure:  68   Non-Invasive Vascular Imaging:   Carotid Duplex on 06/01/2020: Right:  60-79% ICA stenosis Left:  40-59% ICA stenosis  Previous ABI's/TBI's on 05/15/2019: Right:  1.11/0.76 - great toe pressure:  111 Left:  0.99/0.69 - great toe pressure:  102  Previous Carotid duplex on 05/15/2019: Right: 40-59% ICA stenosis Left:   40-59% ICA stenosis    ASSESSMENT/PLAN:: 55 y.o. male here for follow up for bilateral carotid artery stenosis and PAD with hx of ABF bypass grafting in 2014   PAD -pt's ABI's are essentially unchanged.  He does have some leg pain but no rest pain.  He does not have any non healing wounds. -pt will f/u in one year  with ABI.  He knows to call sooner if he has develops rest pain or non healing wounds.   Carotid stenosis -duplex today reveals a slight increase in velocities on the right to the low end range of 60-79% range. He remains 40-59% on the left.  He remains asymptomatic. -discussed s/s of stroke/TIA and if pt develops any sx, they know to call 911 or go to the emergency room -discussed with pt that we would discuss intervention if it gets to be greater than 80% or if he becomes symptomatic.   -pt will f/u in 6 months with carotid duplex  Current smoker:   -discussed importance of smoking cessation.  He is currently using Chantix and has cut back considerably.    -continue statin/asa/brilinta -he will call sooner if he has any issues  Doreatha Massed,  [facility] [phone]  Clinic MD:   Edilia Bo

## 2020-06-02 ENCOUNTER — Other Ambulatory Visit: Payer: Self-pay

## 2020-06-02 DIAGNOSIS — I779 Disorder of arteries and arterioles, unspecified: Secondary | ICD-10-CM

## 2020-06-06 ENCOUNTER — Other Ambulatory Visit: Payer: BC Managed Care – PPO

## 2020-06-23 ENCOUNTER — Other Ambulatory Visit (HOSPITAL_COMMUNITY): Payer: BC Managed Care – PPO

## 2020-07-19 ENCOUNTER — Other Ambulatory Visit: Payer: Self-pay

## 2020-07-19 ENCOUNTER — Ambulatory Visit (HOSPITAL_COMMUNITY): Payer: BC Managed Care – PPO | Attending: Cardiovascular Disease

## 2020-07-19 DIAGNOSIS — E7800 Pure hypercholesterolemia, unspecified: Secondary | ICD-10-CM | POA: Insufficient documentation

## 2020-07-19 DIAGNOSIS — I5032 Chronic diastolic (congestive) heart failure: Secondary | ICD-10-CM

## 2020-07-19 DIAGNOSIS — I25119 Atherosclerotic heart disease of native coronary artery with unspecified angina pectoris: Secondary | ICD-10-CM | POA: Insufficient documentation

## 2020-07-19 DIAGNOSIS — I739 Peripheral vascular disease, unspecified: Secondary | ICD-10-CM | POA: Insufficient documentation

## 2020-07-19 DIAGNOSIS — I6523 Occlusion and stenosis of bilateral carotid arteries: Secondary | ICD-10-CM | POA: Diagnosis not present

## 2020-07-19 LAB — ECHOCARDIOGRAM COMPLETE
Area-P 1/2: 2.24 cm2
S' Lateral: 2.9 cm

## 2020-07-20 ENCOUNTER — Encounter: Payer: Self-pay | Admitting: Physician Assistant

## 2020-08-24 ENCOUNTER — Ambulatory Visit: Payer: BC Managed Care – PPO | Admitting: Internal Medicine

## 2020-08-24 NOTE — Progress Notes (Deleted)
Cardiology Office Note   Date:  08/24/2020   ID:  Tommy Malone., DOB 1966/01/28, MRN [id number]  PCP:  Renford Dills, MD  Cardiologist:   Dietrich Pates, MD   F/U of CAD      History of Present Illness: Tommy Malone. is a 55 y.o. male with a  history of tobacco abuse, hyperlipidemia,  PAD (s/p aortobifemoral bypass graft  12/2012)  bilateral carotid artery disease . And CAD   Myoview stress test in 2014 was normal without ischemia, LVEF was normal 57%.   Cardiac catheterization was performed on 12/18 which showed 99% proximal LAD lesion treated with DES (Xience 3.5 x 33 mm DES), 60% D2 stenosis, 70% proximal to mid LAD lesion covered with same stent, 50% mid RCA lesion, 30% distal RCA lesion, EF 45-50%. Postprocedure, he was enrolled in [place] trial and placed on aspirin and Brilinta. Echocardiogram was obtained on 12/9 which showed EF 55-60%, biplane LVEF 67% by speckle tracking, not all myocardial segments were well visualized, normal diastolic function, unable to assess PASP.     I saw the pt in late Jan 2022   At that time he complained of chest tightness with activity and SOB   Similar to what he experienced prior to last cath Based on this the pt went on to have L heart cath.   This showed:  There is mild to moderate left ventricular systolic dysfunction. The left ventricular ejection fraction is 45-50% by visual estimate -mid to apical inferior and anterior hypokinesis LV end diastolic pressure is moderately elevated. ---------------------------------------------- Mid LAD stent is 65% stenosed. 2nd Diag lesion is 40% stenosed. 2nd Sept lesion is 80% stenosed. -> DFR of LAD 0.93, FFR 0.82-nonsignificant. 2nd Diag lesion is 40% stenosed -> marking the exit point of an intramyocardial segment. Prox RCA to Dist RCA lesion is 100% stenosed -> fills via left to right collaterals from Mid Cx to Dist Cx lesion is 55% stenosed. Mid LAD to Dist LAD lesion is 20% stenosed.    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 Suspect likely culprit angina is the occluded RCA being filled via collaterals from a heavily diseased ostial major 2st Sep branch.    I saw the pt in March 2022  He was having chest pressure at that time   He was seen by Wende Mott after    Outpatient Medications Prior to Visit  Medication Sig Dispense Refill   acetaminophen (TYLENOL) 500 MG tablet Take 1,000 mg by mouth every 8 (eight) hours as needed for moderate pain.     aspirin EC 81 MG tablet Take 81 mg by mouth daily.     atorvastatin (LIPITOR) 80 MG tablet TAKE 1 TABLET BY MOUTH EVERY DAY 90 tablet 3   furosemide (LASIX) 40 MG tablet Take one tablet by mouth ( 40 mg) twice weekly. 24 tablet 9   isosorbide mononitrate (IMDUR) 30 MG 24 hr tablet  Take 1 tablet (30 mg total) by mouth at bedtime. 30 tablet 11   metoprolol succinate (TOPROL-XL) 25 MG 24 hr tablet TAKE 0.5 TABLETS (12.5 MG TOTAL) BY MOUTH 2 (TWO) TIMES DAILY. 90 tablet 2   nitroGLYCERIN (NITROSTAT) 0.4 MG SL tablet Place 1 tablet (0.4 mg total) under the tongue every 5 (five) minutes as needed for chest pain (x 3 doses). 25 tablet 2   pantoprazole (PROTONIX) 40 MG tablet Take 1 tablet by mouth every morning.     potassium chloride (KLOR-CON) 10 MEQ tablet Take 1 tablet (10 mEq total) by mouth daily. 90 tablet 3   sertraline (ZOLOFT) 100 MG tablet Take 1 tablet  (100 mg total) by mouth daily. Patient will have to get future refills from PCP. Provider will no longer be in office. 45 tablet 0   sildenafil (REVATIO) 20 MG tablet Take 60 mg by mouth daily as needed (ED).     ticagrelor (BRILINTA) 60 MG TABS tablet Take 1 tablet (60 mg total) by mouth 2 (two) times daily. 180 tablet 2   varenicline (CHANTIX) 1 MG tablet Take 1 mg by mouth 2 (two) times daily.     No facility-administered medications prior to visit.     Allergies:   Simvastatin   Past Medical History:  Diagnosis Date   Acid reflux    Anxiety    CAD S/P percutaneous coronary angioplasty 01/06/2015   a. NSTEMI - Xience DES 3.5 x 33 p-mLAD; 60% D1, 50% mRCA   b. 95% ost D2 treated with suboptimal PTCA with 55% residual stenosis, 55% mid LAD, 55% mid LAD ISR treated with PTCA, 99% ost 1st septal perforator, 50% mid RCA // Echocardiogram 6/22: EF 67, no RWMA, normal RVSF, mild RAE, trivial MR   Depression    Hemorrhoid    History of kidney stones    Hyperlipidemia    NSTEMI (non-ST elevated myocardial infarction) (HCC) 12/2014   Peripheral vascular disease (HCC)     Past Surgical History:  Procedure Laterality Date   ABDOMINAL AORTAGRAM N/A 12/18/2012   Procedure: ABDOMINAL Ronny Flurry;  Surgeon: Fransisco Hertz, MD;  Location: [facility] CATH LAB;  Service: Cardiovascular;  Laterality: N/A;   AORTA - BILATERAL FEMORAL ARTERY BYPASS GRAFT N/A 01/07/2013   Procedure: AORTA BIFEMORAL BYPASS GRAFT;  Surgeon: Fransisco Hertz, MD;  Location: [facility] OR;  Service: Vascular;  Laterality: N/A;   CARDIAC CATHETERIZATION N/A 01/06/2015   Procedure: Left Heart Cath and Coronary Angiography;  Surgeon: Kathleene Hazel, MD;  Location: [facility] INVASIVE CV LAB;  Service: Cardiovascular;  Laterality: N/A;   CARDIAC CATHETERIZATION N/A 01/06/2015   Procedure: Coronary Stent Intervention;  Surgeon: Kathleene Hazel, MD;  Location: [facility] INVASIVE CV LAB;  Service: Cardiovascular: Xience DES 3.5 mm x 33 mm p-mLAD    CARDIAC CATHETERIZATION N/A 07/02/2015   Procedure: Left Heart Cath and Coronary Angiography;  Surgeon: Marykay Lex, MD;  Location: [facility] INVASIVE CV LAB;  Service: Cardiovascular;  Laterality: N/A;   CARDIAC CATHETERIZATION N/A 07/02/2015   Procedure: Coronary Stent Intervention;  Surgeon: Marykay Lex, MD;  Location: [facility] INVASIVE CV LAB;  Service: Cardiovascular;  Laterality: N/A;   ELBOW SURGERY Bilateral    INTRAVASCULAR PRESSURE WIRE/FFR STUDY N/A 03/04/2020   Procedure: INTRAVASCULAR PRESSURE WIRE/FFR STUDY;  Surgeon: Marykay Lex, MD;  Location: [facility] INVASIVE CV LAB;  Service: Cardiovascular;  Laterality: N/A;   KIDNEY STONE SURGERY     x2   LEFT HEART CATH AND CORONARY ANGIOGRAPHY N/A 03/04/2020  Procedure: LEFT HEART CATH AND CORONARY ANGIOGRAPHY;  Surgeon: Marykay Lex, MD;  Location: [facility] INVASIVE CV LAB;  Service: Cardiovascular;  Laterality: N/A;     Social History:  The patient  reports that he has been smoking cigarettes and e-cigarettes. He has a 30.00 pack-year smoking history. He has never used smokeless tobacco. He reports that he does not drink alcohol and does not use drugs.   Family History:  The patient's family history includes Cancer in his paternal grandfather; Heart attack in his father and mother; Heart disease in his father, maternal grandmother, and mother; Hyperlipidemia in his father and mother.    ROS:  Please see the history of present illness. All other systems are reviewed and  Negative to the above problem except as noted.    PHYSICAL EXAM: VS:  There were no vitals taken for this visit.  GEN: Well nourished, well developed, in no acute distress  HEENT: normal  Neck: no JVD, carotid bruits, Cardiac: RRR; no murmurs, No LE  edema  Respiratory:  clear to auscultation bilaterally, GI: soft, nontender, nondistended, + BS  No hepatomegaly  MS: no deformity Moving all extremities   Skin: warm and dry, no rash Neuro:  CN II to XII intact Psych: euthymic  mood, full affect   EKG:  EKG is not done   Lipid Panel    Component Value Date/Time   CHOL 143 02/23/2020 1211   TRIG 176 (H) 02/23/2020 1211   HDL 37 (L) 02/23/2020 1211   CHOLHDL 3.9 02/23/2020 1211   CHOLHDL 3.4 07/03/2015 0530   VLDL 14 07/03/2015 0530   LDLCALC 76 02/23/2020 1211      Wt Readings from Last 3 Encounters:  06/01/20 175 lb 12.8 oz (79.7 kg)  05/23/20 178 lb 3.4 oz (80.8 kg)  03/31/20 175 lb 3.2 oz (79.5 kg)      ASSESSMENT AND PLAN:  1  CAD Pt with severe CAD  Just had cath noted above LVEDP at cath was 25    Note CTO of RCA with collaterals I would recomm lasix 40 mg with 10 KCL 1x per week   Follow symptoms   If improve would increase to 2x per wk Stop Brilinta and take Plavix Will look into Ranexa 500 bid     Check coverage  2.  PVOD Last ABIs in APril 2021 were mildly abnormal on L   Normal R   Carotid USN in 04/2019:  mod plaquing bilaterally    Will  follow with periodic USNs  3.  HL  Last lipid panel LDL was 76  HDL 37   Follow for now on current regimen Watch diet    4.  Tobacco  Still smoking  Again, counselled on cessation   F/U in 6 wks         Disposition:   FU with me in 4 to 5 months    Signed, Dietrich Pates, MD  08/24/2020 12:20 AM    [facility] [address] [place], [place], [place]  [address] Phone: [phone]; Fax: [phone]

## 2020-09-02 DIAGNOSIS — N529 Male erectile dysfunction, unspecified: Secondary | ICD-10-CM | POA: Diagnosis not present

## 2020-09-02 DIAGNOSIS — F3341 Major depressive disorder, recurrent, in partial remission: Secondary | ICD-10-CM | POA: Diagnosis not present

## 2020-09-02 DIAGNOSIS — I739 Peripheral vascular disease, unspecified: Secondary | ICD-10-CM | POA: Diagnosis not present

## 2020-09-02 DIAGNOSIS — D649 Anemia, unspecified: Secondary | ICD-10-CM | POA: Diagnosis not present

## 2020-09-02 DIAGNOSIS — Z0000 Encounter for general adult medical examination without abnormal findings: Secondary | ICD-10-CM | POA: Diagnosis not present

## 2020-09-02 DIAGNOSIS — E7800 Pure hypercholesterolemia, unspecified: Secondary | ICD-10-CM | POA: Diagnosis not present

## 2020-09-02 DIAGNOSIS — I2510 Atherosclerotic heart disease of native coronary artery without angina pectoris: Secondary | ICD-10-CM | POA: Diagnosis not present

## 2020-09-13 ENCOUNTER — Ambulatory Visit: Payer: BC Managed Care – PPO | Admitting: Internal Medicine

## 2020-12-01 ENCOUNTER — Other Ambulatory Visit: Payer: Self-pay | Admitting: Internal Medicine

## 2021-02-01 ENCOUNTER — Ambulatory Visit: Payer: BC Managed Care – PPO | Admitting: Internal Medicine

## 2021-03-02 ENCOUNTER — Other Ambulatory Visit: Payer: Self-pay | Admitting: Physician Assistant

## 2021-03-02 ENCOUNTER — Other Ambulatory Visit: Payer: Self-pay | Admitting: Cardiology

## 2021-03-02 ENCOUNTER — Other Ambulatory Visit: Payer: Self-pay | Admitting: Internal Medicine

## 2021-04-07 DIAGNOSIS — D649 Anemia, unspecified: Secondary | ICD-10-CM | POA: Diagnosis not present

## 2021-04-07 DIAGNOSIS — I10 Essential (primary) hypertension: Secondary | ICD-10-CM | POA: Diagnosis not present

## 2021-04-07 DIAGNOSIS — E7800 Pure hypercholesterolemia, unspecified: Secondary | ICD-10-CM | POA: Diagnosis not present

## 2021-05-14 ENCOUNTER — Other Ambulatory Visit: Payer: Self-pay | Admitting: Internal Medicine

## 2021-05-18 ENCOUNTER — Ambulatory Visit (INDEPENDENT_AMBULATORY_CARE_PROVIDER_SITE_OTHER): Payer: BC Managed Care – PPO | Admitting: Internal Medicine

## 2021-05-18 ENCOUNTER — Encounter: Payer: Self-pay | Admitting: Internal Medicine

## 2021-05-18 VITALS — BP 90/62 | HR 55 | Ht 69.0 in | Wt 185.4 lb

## 2021-05-18 DIAGNOSIS — I25119 Atherosclerotic heart disease of native coronary artery with unspecified angina pectoris: Secondary | ICD-10-CM

## 2021-05-18 MED ORDER — METOPROLOL SUCCINATE ER 25 MG PO TB24: 12.5000 mg | ORAL_TABLET | Freq: Every day | ORAL | 2 refills | Status: DC

## 2021-05-18 NOTE — Patient Instructions (Signed)
Medication Instructions:  ?DECREASE YOUR METOPROLOL TO 1/2 DAILY ONLY ?*If you need a refill on your cardiac medications before your next appointment, please call your pharmacy* ? ? ?Lab Work: ? ?If you have labs (blood work) drawn today and your tests are completely normal, you will receive your results only by: ?MyChart Message (if you have MyChart) OR ?A paper copy in the mail ?If you have any lab test that is abnormal or we need to change your treatment, we will call you to review the results. ? ? ?Testing/Procedures: ? ? ? ?Follow-Up: ?At [facility], you and your health needs are our priority.  As part of our continuing mission to provide you with exceptional heart care, we have created designated Provider Care Teams.  These Care Teams include your primary Cardiologist (physician) and Advanced Practice Providers (APPs -  Physician Assistants and Nurse Practitioners) who all work together to provide you with the care you need, when you need it. ? ?We recommend signing up for the patient portal called "MyChart".  Sign up information is provided on this After Visit Summary.  MyChart is used to connect with patients for Virtual Visits (Telemedicine).  Patients are able to view lab/test results, encounter notes, upcoming appointments, etc.  Non-urgent messages can be sent to your provider as well.   ?To learn more about what you can do with MyChart, go to [email or web address].   ? ?Your next appointment:   ?1 year(s) ? ?The format for your next appointment:   ?In Person ? ?Provider:   ?Dietrich Pates, MD   ? ? ?Other Instructions ? ? ?Important Information About Sugar ? ? ? ? ? ?

## 2021-05-18 NOTE — Progress Notes (Addendum)
? ?Cardiology Office Note ? ? ?Date:  05/18/2021  ? ?ID:  Collene Malone., DOB 15-Mar-1965, MRN [id number] ? ?PCP:  Seward Carol, MD  ?Cardiologist:   Dorris Carnes, MD  ? ?F/U of CAD   ? ?  ?History of Present Illness: ?Tommy Malone. is a 56 y.o. male with a  history of tobacco abuse, hyperlipidemia,  PAD (s/p aortobifemoral bypass graft  12/2012)  bilateral carotid artery disease  and CAD   Cardiac catheterization was performed on 12/18 which showed 99% proximal LAD lesion treated with DES (Xience 3.5 x 33 mm DES), 60% D2 stenosis, 70% proximal to mid LAD lesion covered with same stent, 50% mid RCA lesion, 30% distal RCA lesion, EF 45-50%. Postprocedure, he was enrolled in [place] trial and placed on aspirin and Brilinta. Echocardiogram was obtained on 12/9 which showed EF 55-60%, biplane LVEF 67% by speckle tracking, not all myocardial segments were well visualized, normal diastolic function, unable to assess PASP.  ?   ?I saw the pt in late Jan 2022   At that time he complained of chest tightness with activity and SOB   Similar to what he experienced prior to last cath ?Based on this the pt went on to have L heart cath.   This showed: ? ?There is mild to moderate left ventricular systolic dysfunction. The left ventricular ejection fraction is 45-50% by visual estimate -mid to apical inferior and anterior hypokinesis ?LV end diastolic pressure is moderately elevated. ?---------------------------------------------- ?Mid LAD stent is 65% stenosed. 2nd Diag lesion is 40% stenosed. 2nd Sept lesion is 80% stenosed. -> DFR of LAD 0.93, FFR 0.82-nonsignificant. ?2nd Diag lesion is 40% stenosed -> marking the exit point of an intramyocardial segment. ?Prox RCA to Dist RCA lesion is 100% stenosed -> fills via left to right collaterals from ?Mid Cx to Dist Cx lesion is 55% stenosed. ?Mid LAD to Dist LAD lesion is 20% stenosed. ?  ?SUMMARY ?Severe native CAD involving RCA and LAD: New 100% CTO of ostial RCA with extensive  L-R collaterals from  2stSep, 60% ISR of mid LAD (DFR/FFR negative) with jailed 2ndDiag ostial 40% stenosis & major 2stSep 80%; moderate proximal LCx 55% (DFR 0.95) ?DFR prox LCx - 0.95 (not significant) ?DFR mid LAD ISR 0.93 --FFR 0.82 (borderline, but nonsignificant) ?The LAD beyond the stented segment does appear to go some myocardial with myocardial bridging noted at the distal exit point ?Mild to moderately reduced LVEF of roughly 45% with inferior and apical hypokinesis -> would recommend 2D echocardiogram to better evaluate.Tommy Malone ?Suspect likely culprit angina is the occluded RCA being filled via collaterals from a heavily diseased ostial major 2st Sep branch. ?  ? ?I saw the pt in 2022   He was seen by Kathleen Argue after. ? ?Since seen he says he is doing good    He denies CP   Breathing is OK    ?He is active    ?Outpatient Medications Prior to Visit  ?Medication Sig Dispense Refill  ? acetaminophen (TYLENOL) 500 MG tablet Take 1,000 mg by mouth every 8 (eight) hours as needed for moderate pain.    ? aspirin EC 81 MG tablet Take 81 mg by mouth daily.    ? atorvastatin (LIPITOR) 80 MG tablet TAKE 1 TABLET BY MOUTH EVERY DAY 90 tablet 3  ? BRILINTA 60 MG TABS tablet TAKE 1 TABLET BY MOUTH 2 TIMES DAILY. 180 tablet 2  ? furosemide (LASIX) 40 MG tablet Take one tablet by  mouth ( 40 mg) twice weekly. 24 tablet 9  ? isosorbide mononitrate (IMDUR) 30 MG 24 hr tablet Take 1 tablet (30 mg total) by mouth at bedtime. 30 tablet 11  ? metoprolol succinate (TOPROL-XL) 25 MG 24 hr tablet TAKE 0.5 TABLETS BY MOUTH 2 TIMES DAILY. 90 tablet 2  ? nitroGLYCERIN (NITROSTAT) 0.4 MG SL tablet Place 1 tablet (0.4 mg total) under the tongue every 5 (five) minutes as needed for chest pain (x 3 doses). 25 tablet 2  ? pantoprazole (PROTONIX) 40 MG tablet Take 1 tablet by mouth every morning.    ? potassium chloride (KLOR-CON) 10 MEQ tablet TAKE 1 TABLET BY MOUTH EVERY DAY 30 tablet 0  ? sertraline (ZOLOFT) 100 MG tablet Take 1 tablet (100 mg  total) by mouth daily. Patient will have to get future refills from PCP. Provider will no longer be in office. 45 tablet 0  ? sildenafil (REVATIO) 20 MG tablet Take 60 mg by mouth daily as needed (ED).    ? varenicline (CHANTIX) 1 MG tablet Take 1 mg by mouth 2 (two) times daily.    ? ?No facility-administered medications prior to visit.  ? ? ? ?Allergies:   Simvastatin  ? ?Past Medical History:  ?Diagnosis Date  ? Acid reflux   ? Anxiety   ? CAD S/P percutaneous coronary angioplasty 01/06/2015  ? a. NSTEMI - Xience DES 3.5 x 33 p-mLAD; 60% D1, 50% mRCA   b. 95% ost D2 treated with suboptimal PTCA with 55% residual stenosis, 55% mid LAD, 55% mid LAD ISR treated with PTCA, 99% ost 1st septal perforator, 50% mid RCA // Echocardiogram 6/22: EF 67, no RWMA, normal RVSF, mild RAE, trivial MR  ? Depression   ? Hemorrhoid   ? History of kidney stones   ? Hyperlipidemia   ? NSTEMI (non-ST elevated myocardial infarction) ([place]) 12/2014  ? Peripheral vascular disease ([place])   ? ? ?Past Surgical History:  ?Procedure Laterality Date  ? ABDOMINAL AORTAGRAM N/A 12/18/2012  ? Procedure: ABDOMINAL AORTAGRAM;  Surgeon: Conrad St. Libory, MD;  Location: [facility] CATH LAB;  Service: Cardiovascular;  Laterality: N/A;  ? AORTA - BILATERAL FEMORAL ARTERY BYPASS GRAFT N/A 01/07/2013  ? Procedure: AORTA BIFEMORAL BYPASS GRAFT;  Surgeon: Conrad Edwards, MD;  Location: [place];  Service: Vascular;  Laterality: N/A;  ? CARDIAC CATHETERIZATION N/A 01/06/2015  ? Procedure: Left Heart Cath and Coronary Angiography;  Surgeon: Burnell Blanks, MD;  Location: [place] CV LAB;  Service: Cardiovascular;  Laterality: N/A;  ? CARDIAC CATHETERIZATION N/A 01/06/2015  ? Procedure: Coronary Stent Intervention;  Surgeon: Burnell Blanks, MD;  Location: [place] CV LAB;  Service: Cardiovascular: Xience DES 3.5 mm x 33 mm p-mLAD  ? CARDIAC CATHETERIZATION N/A 07/02/2015  ? Procedure: Left Heart Cath and Coronary Angiography;  Surgeon: Leonie Man, MD;   Location: [facility] CV LAB;  Service: Cardiovascular;  Laterality: N/A;  ? CARDIAC CATHETERIZATION N/A 07/02/2015  ? Procedure: Coronary Stent Intervention;  Surgeon: Leonie Man, MD;  Location: [place] CV LAB;  Service: Cardiovascular;  Laterality: N/A;  ? ELBOW SURGERY Bilateral   ? INTRAVASCULAR PRESSURE WIRE/FFR STUDY N/A 03/04/2020  ? Procedure: INTRAVASCULAR PRESSURE WIRE/FFR STUDY;  Surgeon: Leonie Man, MD;  Location: [place] CV LAB;  Service: Cardiovascular;  Laterality: N/A;  ? KIDNEY STONE SURGERY    ? x2  ? LEFT HEART CATH AND CORONARY ANGIOGRAPHY N/A 03/04/2020  ? Procedure: LEFT HEART CATH AND CORONARY ANGIOGRAPHY;  Surgeon: Leonie Man, MD;  Location: [place] CV LAB;  Service: Cardiovascular;  Laterality: N/A;  ? ? ? ?Social History:  The patient  reports that he has been smoking cigarettes and e-cigarettes. He has a 30.00 pack-year smoking history. He has never used smokeless tobacco. He reports that he does not drink alcohol and does not use drugs.  ? ?Family History:  The patient's family history includes Cancer in his paternal grandfather; Heart attack in his father and mother; Heart disease in his father, maternal grandmother, and mother; Hyperlipidemia in his father and mother.  ? ? ?ROS:  Please see the history of present illness. All other systems are reviewed and  Negative to the above problem except as noted.  ? ? ?PHYSICAL EXAM: ?VS:  BP 90/62   Pulse (!) 55   Ht 5\' 9"  (1.753 m)   Wt 185 lb 6.4 oz (84.1 kg)   SpO2 95%   BMI 27.38 kg/m?   ?GEN: Well nourished, well developed, in no acute distress  ?HEENT: normal  ?Neck: no JVD, carotid bruits, ?Cardiac: RRR; no murmurs, No LE  edema  ?Respiratory:  clear to auscultation bilaterally, ?GI: soft, nontender, nondistended, + BS  No hepatomegaly  ?MS: no deformity Moving all extremities   ?Skin: warm and dry, no rash ?Neuro:  CN II to XII intact ?Psych: euthymic mood, full affect ? ? ?EKG:  EKG shows SB 55 bpm   ? ?Lipid  Panel ?   ?Component Value Date/Time  ? CHOL 143 02/23/2020 1211  ? TRIG 176 (H) 02/23/2020 1211  ? HDL 37 (L) 02/23/2020 1211  ? CHOLHDL 3.9 02/23/2020 1211  ? CHOLHDL 3.4 07/03/2015 0530  ? VLDL 14 06/04

## 2021-05-28 ENCOUNTER — Other Ambulatory Visit: Payer: Self-pay | Admitting: Internal Medicine

## 2021-07-07 ENCOUNTER — Other Ambulatory Visit: Payer: Self-pay | Admitting: Internal Medicine

## 2021-10-12 DIAGNOSIS — I10 Essential (primary) hypertension: Secondary | ICD-10-CM | POA: Diagnosis not present

## 2021-10-12 DIAGNOSIS — N529 Male erectile dysfunction, unspecified: Secondary | ICD-10-CM | POA: Diagnosis not present

## 2021-10-12 DIAGNOSIS — E7800 Pure hypercholesterolemia, unspecified: Secondary | ICD-10-CM | POA: Diagnosis not present

## 2021-10-12 DIAGNOSIS — Z0000 Encounter for general adult medical examination without abnormal findings: Secondary | ICD-10-CM | POA: Diagnosis not present

## 2021-10-12 DIAGNOSIS — Z125 Encounter for screening for malignant neoplasm of prostate: Secondary | ICD-10-CM | POA: Diagnosis not present

## 2022-01-08 ENCOUNTER — Other Ambulatory Visit: Payer: Self-pay | Admitting: Internal Medicine

## 2022-02-28 ENCOUNTER — Other Ambulatory Visit: Payer: Self-pay | Admitting: Internal Medicine

## 2022-04-13 ENCOUNTER — Other Ambulatory Visit: Payer: Self-pay | Admitting: Internal Medicine

## 2022-06-26 ENCOUNTER — Other Ambulatory Visit: Payer: Self-pay | Admitting: Internal Medicine

## 2022-07-07 ENCOUNTER — Other Ambulatory Visit: Payer: Self-pay | Admitting: Internal Medicine

## 2022-07-30 ENCOUNTER — Other Ambulatory Visit: Payer: Self-pay | Admitting: Internal Medicine

## 2022-08-04 ENCOUNTER — Other Ambulatory Visit: Payer: Self-pay | Admitting: Internal Medicine

## 2022-08-06 MED ORDER — TICAGRELOR 60 MG PO TABS: 60.0000 mg | ORAL_TABLET | Freq: Two times a day (BID) | ORAL | 0 refills | Status: DC

## 2022-08-28 ENCOUNTER — Other Ambulatory Visit: Payer: Self-pay | Admitting: Internal Medicine

## 2022-09-05 ENCOUNTER — Other Ambulatory Visit: Payer: Self-pay | Admitting: *Deleted

## 2022-09-05 ENCOUNTER — Telehealth: Payer: Self-pay | Admitting: Internal Medicine

## 2022-09-05 MED ORDER — TICAGRELOR 60 MG PO TABS: 60.0000 mg | ORAL_TABLET | Freq: Two times a day (BID) | ORAL | 0 refills | Status: DC

## 2022-09-05 NOTE — Telephone Encounter (Signed)
*  STAT* If patient is at the pharmacy, call can be transferred to refill team.   1. Which medications need to be refilled? (please list name of each medication and dose if known) icagrelor (BRILINTA) 60 MG TABS tablet  2. Which pharmacy/location (including street and city if local pharmacy) is medication to be sent to?[id number] - [place], [place] - [address]   3. Do they need a 30 day or 90 day supply? 30 Day Supply  Pt has an appt scheduled for 08/15.

## 2022-09-11 NOTE — Progress Notes (Unsigned)
Office Visit    Patient Name: Tommy Malone. Date of Encounter: 09/11/2022  Primary Care Provider:  Renford Dills, MD Primary Cardiologist:  Tommy Pates, MD Primary Electrophysiologist: None   Past Medical History    Past Medical History:  Diagnosis Date   Acid reflux    Anxiety    CAD S/P percutaneous coronary angioplasty 01/06/2015   a. NSTEMI - Xience DES 3.5 x 33 p-mLAD; 60% D1, 50% mRCA   b. 95% ost D2 treated with suboptimal PTCA with 55% residual stenosis, 55% mid LAD, 55% mid LAD ISR treated with PTCA, 99% ost 1st septal perforator, 50% mid RCA // Echocardiogram 6/22: EF 67, no RWMA, normal RVSF, mild RAE, trivial MR   Depression    Hemorrhoid    History of kidney stones    Hyperlipidemia    NSTEMI (non-ST elevated myocardial infarction) (HCC) 12/2014   Peripheral vascular disease (HCC)    Past Surgical History:  Procedure Laterality Date   ABDOMINAL AORTAGRAM N/A 12/18/2012   Procedure: ABDOMINAL Ronny Flurry;  Surgeon: Tommy Hertz, MD;  Location: [facility] CATH LAB;  Service: Cardiovascular;  Laterality: N/A;   AORTA - BILATERAL FEMORAL ARTERY BYPASS GRAFT N/A 01/07/2013   Procedure: AORTA BIFEMORAL BYPASS GRAFT;  Surgeon: Tommy Hertz, MD;  Location: [facility] OR;  Service: Vascular;  Laterality: N/A;   CARDIAC CATHETERIZATION N/A 01/06/2015   Procedure: Left Heart Cath and Coronary Angiography;  Surgeon: Tommy Hazel, MD;  Location: [facility] INVASIVE CV LAB;  Service: Cardiovascular;  Laterality: N/A;   CARDIAC CATHETERIZATION N/A 01/06/2015   Procedure: Coronary Stent Intervention;  Surgeon: Tommy Hazel, MD;  Location: [facility] INVASIVE CV LAB;  Service: Cardiovascular: Xience DES 3.5 mm x 33 mm p-mLAD   CARDIAC CATHETERIZATION N/A 07/02/2015   Procedure: Left Heart Cath and Coronary Angiography;  Surgeon: Tommy Lex, MD;  Location: [facility] INVASIVE CV LAB;  Service: Cardiovascular;  Laterality: N/A;   CARDIAC CATHETERIZATION N/A 07/02/2015   Procedure: Coronary Stent  Intervention;  Surgeon: Tommy Lex, MD;  Location: [facility] INVASIVE CV LAB;  Service: Cardiovascular;  Laterality: N/A;   CORONARY PRESSURE/FFR STUDY N/A 03/04/2020   Procedure: INTRAVASCULAR PRESSURE WIRE/FFR STUDY;  Surgeon: Tommy Lex, MD;  Location: [facility] INVASIVE CV LAB;  Service: Cardiovascular;  Laterality: N/A;   ELBOW SURGERY Bilateral    KIDNEY STONE SURGERY     x2   LEFT HEART CATH AND CORONARY ANGIOGRAPHY N/A 03/04/2020   Procedure: LEFT HEART CATH AND CORONARY ANGIOGRAPHY;  Surgeon: Tommy Lex, MD;  Location: [facility] INVASIVE CV LAB;  Service: Cardiovascular;  Laterality: N/A;    Allergies  Allergies  Allergen Reactions   Simvastatin Nausea Only    Stomach aches     History of Present Illness    Tommy Malone.  is a 57 year old male with with a PMH of CAD s/p NSTEMI 12/16 with DES to pLAD, STEMI 2017 s/p unsuccessful POBA of D2 (jailed), LHC 03/2020 100% ostial RCA CTO with right to left collateral of 65% mid LAD ISR negative DFR/FFR treated medically, peripheral artery disease s/p aortobifem 12/14, bilateral carotid stenosis (40-59%), tobacco abuse, HLD who presents today for 1 year follow-up.  Mr. Tommy Malone was seen initially in 12/2012 by Dr. Tenny Malone for preoperative risk evaluation prior to undergoing aortobifem due to severe PAD.  Lexiscan Myoview was normal with no evidence of ischemia. He also had bilateral carotid artery disease with evidence of left subclavian stenosis. Carotid Doppler performed in  January 2015 showed 40% right ICA stenosis and 40-59% left ICA stenosis.   He presented to the ED 12/2014 with complaint of chest pain and ruled in for NSTEMI LHC was completed showed severe 99% stenosis of the proximal LAD and mid LAD, successfully treated with PCI + DES.  He was seen 06/2015 with sudden onset of chest pain EKG showed STEMI LHC performed 5% residual ISR treated with PTCA septal perforator was jailed post cath with trended troponins however patient did well clinically.   He was seen by Dr. Tenny Malone on 03/31/2020 episodes of chest pressure and underwent LHC that showed 100% CTO of ostial RCA with extensive left-to-right collaterals from second septal, 6% ISR of mid LAD DFR/FFR jailed second diagonal and ostial 40%.  LVEF was reduced 45% with inferior hypokinesis echo was completed verification that showed of 67% was last seen by Dr. Tenny Malone on 05/18/2021 complaints of chest pain.  He was continued on current medication regimen with no adjustment made at that time.  Since last being seen in the office patient reports has been doing well with no new cardiac complaints.  Does endorse occasional episodes of chest pain that occur with and without activity.  He reports that these episodes resolve spontaneously without any additional intervention.  His blood pressure today is controlled at 110/78 and heart rate is 57 bpm.  He reports compliance with current medications and denies any adverse reactions.  He currently is unable to exercise at a gym due to increased work hours but does do push-ups and sit ups.  He eats a balanced diet but does endorse poor choices due to affordability and availability.  He does stay active and patient denies chest pain, palpitations, dyspnea, PND, orthopnea, nausea, vomiting, dizziness, syncope, edema, weight gain, or early satiety.  Home Medications    Current Outpatient Medications  Medication Sig Dispense Refill   acetaminophen (TYLENOL) 500 MG tablet Take 1,000 mg by mouth every 8 (eight) hours as needed for moderate pain.     aspirin EC 81 MG tablet Take 81 mg by mouth daily.     atorvastatin (LIPITOR) 80 MG tablet TAKE 1 TABLET BY MOUTH EVERY DAY 90 tablet 0   furosemide (LASIX) 40 MG tablet Take one tablet by mouth ( 40 mg) twice weekly. 24 tablet 9   isosorbide mononitrate (IMDUR) 30 MG 24 hr tablet Take 1 tablet (30 mg total) by mouth at bedtime. 30 tablet 11   metoprolol succinate (TOPROL-XL) 25 MG 24 hr tablet Take 0.5 tablets (12.5 mg total) by  mouth daily. Please call [phone] to schedule an overdue appointment for future refills. Thank you. 1st attempt. 15 tablet 0   nitroGLYCERIN (NITROSTAT) 0.4 MG SL tablet Place 1 tablet (0.4 mg total) under the tongue every 5 (five) minutes as needed for chest pain (x 3 doses). 25 tablet 2   pantoprazole (PROTONIX) 40 MG tablet Take 1 tablet by mouth every morning.     potassium chloride (KLOR-CON) 10 MEQ tablet Take 1 tablet (10 mEq total) by mouth daily. Please call [phone] to schedule an overdue appointment for future refills. Thank you. 2nd attempt. 15 tablet 0   sertraline (ZOLOFT) 100 MG tablet Take 1 tablet (100 mg total) by mouth daily. Patient will have to get future refills from PCP. Provider will no longer be in office. 45 tablet 0   sildenafil (REVATIO) 20 MG tablet Take 60 mg by mouth daily as needed (ED).     ticagrelor (BRILINTA) 60 MG TABS tablet  Take 1 tablet (60 mg total) by mouth 2 (two) times daily. Please keep upcoming appointment for future refills. Thank you 60 tablet 0   varenicline (CHANTIX) 1 MG tablet Take 1 mg by mouth 2 (two) times daily.     No current facility-administered medications for this visit.     Review of Systems  Please see the history of present illness.    (+)chest pain  All other systems reviewed and are otherwise negative except as noted above.  Physical Exam    Wt Readings from Last 3 Encounters:  05/18/21 185 lb 6.4 oz (84.1 kg)  06/01/20 175 lb 12.8 oz (79.7 kg)  05/23/20 178 lb 3.4 oz (80.8 kg)   UE:AVWUJ were no vitals filed for this visit.,There is no height or weight on file to calculate BMI.  Constitutional:      Appearance: Healthy appearance. Not in distress.  Neck:     Vascular: JVD normal.  Pulmonary:     Effort: Pulmonary effort is normal.     Breath sounds: No wheezing. No rales. Diminished in the bases Cardiovascular:     Normal rate. Regular rhythm. Normal S1. Normal S2.      Murmurs: There is no murmur.   Edema:    Peripheral edema absent.  Abdominal:     Palpations: Abdomen is soft non tender. There is no hepatomegaly.  Skin:    General: Skin is warm and dry.  Neurological:     General: No focal deficit present.     Mental Status: Alert and oriented to person, place and time.     Cranial Nerves: Cranial nerves are intact.  EKG/LABS/ Recent Cardiac Studies    ECG personally reviewed by me today -sinus bradycardia with rate of 57 bpm and no acute changes consistent with previous EKG.    Lab Results  Component Value Date   WBC 7.6 02/23/2020   HGB 11.6 (L) 02/23/2020   HCT 39.3 02/23/2020   MCV 76 (L) 02/23/2020   PLT 208 02/23/2020   Lab Results  Component Value Date   CREATININE 1.03 02/23/2020   BUN 14 02/23/2020   NA 138 02/23/2020   K 4.6 02/23/2020   CL 103 02/23/2020   CO2 23 02/23/2020   Lab Results  Component Value Date   ALT 10 02/23/2019   AST 14 02/23/2019   ALKPHOS 106 02/23/2019   BILITOT 0.2 02/23/2019   Lab Results  Component Value Date   CHOL 143 02/23/2020   HDL 37 (L) 02/23/2020   LDLCALC 76 02/23/2020   TRIG 176 (H) 02/23/2020   CHOLHDL 3.9 02/23/2020    Lab Results  Component Value Date   HGBA1C 5.7 (H) 07/03/2015     Assessment & Plan    1.  Coronary artery disease: -CAD s/p NSTEMI 12/16 with DES to pLAD, STEMI 2017 s/p unsuccessful POBA of D2 (jailed), [facility] 03/2020 100% ostial RCA CTO with right to left collateral of 65% mid LAD ISR negative DFR/FFR treated medically -Today patient reports chest discomfort that occurs spontaneously and resolves intervention. -He was previously prescribed Imdur and ASA 81 mg but was currently not taking these medications. -We will restart Imdur 30 mg daily and ASA 81 mg -Continue GDMT with Brilinta 60 mg twice daily, Toprol-XL 12.5 mg, Lipitor 80 mg daily  2.  HFpEF: -Most recent 2D echo completed showing normal LV function -Today patient is euvolemic on examination and denies any shortness of breath  or fatigue. -Patient is no longer taking Lasix  or potassium  -Continue current medications as prescribed  3.  Peripheral artery disease: -Currently followed by [facility] with previous ABIs showing left lower disease in the left and significant disease on the right. -Today patient reports no episodes of claudication -Continue ASA 81 mg and Brilinta 60 mg twice daily  4.  Hyperlipidemia: -Continue Lipitor 80 mg daily -Patient will have lipids completed at annual follow-up with PCP  5.  Bilateral carotid stenosis: -Right carotid 60 to 79% and left carotid 40-59% -Continue GDMT with ASA 81 mg, Lipitor 80 mg, and Brilinta 60 mg twice daily  6.  Tobacco abuse: -Today patient reports no longer smokes and quit over a year and a half ago with the help of Chantix. -He was congratulated for this outstanding accomplishment.   Disposition: Follow-up with Tommy Pates, MD or APP in 12 months    Medication Adjustments/Labs and Tests Ordered: Current medicines are reviewed at length with the patient today.  Concerns regarding medicines are outlined above.   Signed, Napoleon Form, Leodis Rains, NP 09/11/2022, 1:51 PM [facility]

## 2022-09-13 ENCOUNTER — Ambulatory Visit: Payer: BC Managed Care – PPO | Admitting: Nurse Practitioner

## 2022-09-13 ENCOUNTER — Encounter: Payer: Self-pay | Admitting: Nurse Practitioner

## 2022-09-13 VITALS — BP 110/78 | HR 57 | Ht 69.0 in | Wt 186.8 lb

## 2022-09-13 DIAGNOSIS — Z720 Tobacco use: Secondary | ICD-10-CM

## 2022-09-13 DIAGNOSIS — E785 Hyperlipidemia, unspecified: Secondary | ICD-10-CM | POA: Diagnosis not present

## 2022-09-13 DIAGNOSIS — I2510 Atherosclerotic heart disease of native coronary artery without angina pectoris: Secondary | ICD-10-CM

## 2022-09-13 DIAGNOSIS — Z9861 Coronary angioplasty status: Secondary | ICD-10-CM

## 2022-09-13 DIAGNOSIS — I7409 Other arterial embolism and thrombosis of abdominal aorta: Secondary | ICD-10-CM

## 2022-09-13 DIAGNOSIS — I6523 Occlusion and stenosis of bilateral carotid arteries: Secondary | ICD-10-CM

## 2022-09-13 MED ORDER — ISOSORBIDE MONONITRATE ER 30 MG PO TB24: 30.0000 mg | ORAL_TABLET | Freq: Every day | ORAL | 3 refills | Status: DC

## 2022-09-13 MED ORDER — ASPIRIN 81 MG PO TBEC: 81.0000 mg | DELAYED_RELEASE_TABLET | Freq: Every day | ORAL | Status: AC

## 2022-09-13 NOTE — Patient Instructions (Signed)
Medication Instructions:  START Imdur 30mg  Take 1 tablet once a day  START Aspirin 81mg  Take 1 tablet once a day  *If you need a refill on your cardiac medications before your next appointment, please call your pharmacy*   Lab Work: COMPLETE AT DR Idelle Crouch OFFICE AND SEND OUR OFFICE A COPY If you have labs (blood work) drawn today and your tests are completely normal, you will receive your results only by: MyChart Message (if you have MyChart) OR A paper copy in the mail If you have any lab test that is abnormal or we need to change your treatment, we will call you to review the results.   Testing/Procedures: NONE ORDERED   Follow-Up: At [facility], you and your health needs are our priority.  As part of our continuing mission to provide you with exceptional heart care, we have created designated Provider Care Teams.  These Care Teams include your primary Cardiologist (physician) and Advanced Practice Providers (APPs -  Physician Assistants and Nurse Practitioners) who all work together to provide you with the care you need, when you need it.  We recommend signing up for the patient portal called "MyChart".  Sign up information is provided on this After Visit Summary.  MyChart is used to connect with patients for Virtual Visits (Telemedicine).  Patients are able to view lab/test results, encounter notes, upcoming appointments, etc.  Non-urgent messages can be sent to your provider as well.   To learn more about what you can do with MyChart, go to [email or web address].    Your next appointment:   12 month(s)  Provider:   Dietrich Pates, MD     Other Instructions CHECK OUT THE [employer]

## 2022-09-20 ENCOUNTER — Other Ambulatory Visit: Payer: Self-pay | Admitting: Internal Medicine

## 2022-09-23 ENCOUNTER — Other Ambulatory Visit: Payer: Self-pay | Admitting: Internal Medicine

## 2022-10-10 ENCOUNTER — Other Ambulatory Visit: Payer: Self-pay | Admitting: Internal Medicine

## 2022-10-16 DIAGNOSIS — I2510 Atherosclerotic heart disease of native coronary artery without angina pectoris: Secondary | ICD-10-CM | POA: Diagnosis not present

## 2022-10-16 DIAGNOSIS — Z125 Encounter for screening for malignant neoplasm of prostate: Secondary | ICD-10-CM | POA: Diagnosis not present

## 2022-10-16 DIAGNOSIS — I10 Essential (primary) hypertension: Secondary | ICD-10-CM | POA: Diagnosis not present

## 2022-10-16 DIAGNOSIS — E7800 Pure hypercholesterolemia, unspecified: Secondary | ICD-10-CM | POA: Diagnosis not present

## 2022-10-16 DIAGNOSIS — Z0000 Encounter for general adult medical examination without abnormal findings: Secondary | ICD-10-CM | POA: Diagnosis not present

## 2022-12-24 ENCOUNTER — Other Ambulatory Visit: Payer: Self-pay | Admitting: *Deleted

## 2022-12-24 DIAGNOSIS — I779 Disorder of arteries and arterioles, unspecified: Secondary | ICD-10-CM

## 2022-12-24 DIAGNOSIS — I70219 Atherosclerosis of native arteries of extremities with intermittent claudication, unspecified extremity: Secondary | ICD-10-CM

## 2023-01-09 ENCOUNTER — Ambulatory Visit (HOSPITAL_COMMUNITY): Payer: BC Managed Care – PPO

## 2023-01-09 ENCOUNTER — Ambulatory Visit (HOSPITAL_COMMUNITY): Payer: BC Managed Care – PPO | Attending: Vascular Surgery

## 2023-01-09 ENCOUNTER — Ambulatory Visit: Payer: BC Managed Care – PPO

## 2023-02-06 ENCOUNTER — Other Ambulatory Visit: Payer: Self-pay

## 2023-02-06 ENCOUNTER — Telehealth: Payer: Self-pay | Admitting: Internal Medicine

## 2023-02-06 DIAGNOSIS — R079 Chest pain, unspecified: Secondary | ICD-10-CM

## 2023-02-06 MED ORDER — RANOLAZINE ER 500 MG PO TB12: 500.0000 mg | ORAL_TABLET | Freq: Two times a day (BID) | ORAL | 3 refills | Status: DC

## 2023-02-06 NOTE — Progress Notes (Signed)
 Orders placed for Ranexa 500 mg BID and BNP, Pro-BNP, and Troponin. Lab orders have been released.

## 2023-02-06 NOTE — Telephone Encounter (Signed)
 Spoke with the pt about the CP and SOB he reported to be having. Pt stated he is not having active CP but he continues to have intermittent CP. Pt stated he has not been taking Imdur  30 mg prescribed to him in 08/2022 because it gave him headaches. Medication list has been updated. Pt stated over the last 1-2 weeks his SOB has been worsening. BP while pt was on the phone was 125/83 HR 63. DOD (Dr. Delford) advised we order Ranexa  500 mg BID for the pt and order an BMP, Pro-BNP, and Troponin. Pt is aware of these changes and verbalized understanding and needing to get these labs completed today. ED precautions explained to pt and he verbalized understanding. No further questions at this time.

## 2023-02-06 NOTE — Telephone Encounter (Signed)
   Pt c/o of Chest Pain: STAT if active CP, including tightness, pressure, jaw pain, radiating pain to shoulder/upper arm/back, CP unrelieved by Nitro. Symptoms reported of SOB, nausea, vomiting, sweating.  1. Are you having CP right now? No     2. Are you experiencing any other symptoms (ex. SOB, nausea, vomiting, sweating)? Nothing currently but has had some SOB and loss of appetite.    3. Is your CP continuous or coming and going? Coming and going    4. Have you taken Nitroglycerin ? He states he has some but hasn't taken it.    5. How long have you been experiencing CP? He states its been severe the last couple of weeks   6. If NO CP at time of call then end call with telling Pt to call back or call 911 if Chest pain returns prior to return call from triage team.

## 2023-02-07 LAB — BASIC METABOLIC PANEL
BUN/Creatinine Ratio: 19 (ref 9–20)
BUN: 18 mg/dL (ref 6–24)
CO2: 22 mmol/L (ref 20–29)
Calcium: 9.2 mg/dL (ref 8.7–10.2)
Chloride: 100 mmol/L (ref 96–106)
Creatinine, Ser: 0.93 mg/dL (ref 0.76–1.27)
Glucose: 92 mg/dL (ref 70–99)
Potassium: 4.6 mmol/L (ref 3.5–5.2)
Sodium: 137 mmol/L (ref 134–144)
eGFR: 96 mL/min/{1.73_m2} (ref >59)

## 2023-02-07 LAB — PRO B NATRIURETIC PEPTIDE: NT-Pro BNP: 65 pg/mL (ref 0–210)

## 2023-03-06 ENCOUNTER — Ambulatory Visit (INDEPENDENT_AMBULATORY_CARE_PROVIDER_SITE_OTHER)
Admission: RE | Admit: 2023-03-06 | Discharge: 2023-03-06 | Discharge status: Home / Self Care | Payer: BC Managed Care – PPO | Source: Ambulatory Visit | Attending: Vascular Surgery

## 2023-03-06 ENCOUNTER — Ambulatory Visit (HOSPITAL_COMMUNITY)
Admission: RE | Admit: 2023-03-06 | Discharge: 2023-03-06 | Disposition: A | Discharge status: Home / Self Care | Payer: BC Managed Care – PPO | Source: Ambulatory Visit | Attending: Vascular Surgery | Admitting: Vascular Surgery

## 2023-03-06 ENCOUNTER — Ambulatory Visit (INDEPENDENT_AMBULATORY_CARE_PROVIDER_SITE_OTHER): Payer: BC Managed Care – PPO | Admitting: Physician Assistant

## 2023-03-06 VITALS — BP 129/84 | HR 54 | Temp 97.8°F | Resp 18 | Ht 69.0 in | Wt 189.3 lb

## 2023-03-06 DIAGNOSIS — I779 Disorder of arteries and arterioles, unspecified: Secondary | ICD-10-CM | POA: Diagnosis not present

## 2023-03-06 DIAGNOSIS — I70219 Atherosclerosis of native arteries of extremities with intermittent claudication, unspecified extremity: Secondary | ICD-10-CM

## 2023-03-06 LAB — VAS US ABI WITH/WO TBI
Left ABI: 0.69
Right ABI: 0.98

## 2023-03-06 NOTE — Progress Notes (Signed)
 Office Note     CC:  follow up Requesting Provider:  Rexanne Ingle, MD  HPI: Tommy Malone. is a 58 y.o. (November 18, 1965) male who presents for surveillance of PAD and carotid artery stenosis.  Surgical history significant for aortobifemoral bypass graft by Dr. Laurence in 2014.  This was performed due to bilateral lower extremity rest pain.  He denies any claudication, rest pain, or tissue loss of bilateral lower extremities.  He is a former smoker.  He takes aspirin , Brilinta , statin daily.  He is followed regularly with cardiology for CAD.    He is also followed for carotid artery stenosis.  He denies any diagnosis of CVA or TIA since last office visit and has not had any neurological events including slurring speech, changes in vision, or one-sided weakness.   Past Medical History:  Diagnosis Date   Acid reflux    Anxiety    CAD S/P percutaneous coronary angioplasty 01/06/2015   a. NSTEMI - Xience DES 3.5 x 33 p-mLAD; 60% D1, 50% mRCA   b. 95% ost D2 treated with suboptimal PTCA with 55% residual stenosis, 55% mid LAD, 55% mid LAD ISR treated with PTCA, 99% ost 1st septal perforator, 50% mid RCA // Echocardiogram 6/22: EF 67, no RWMA, normal RVSF, mild RAE, trivial MR   Depression    Hemorrhoid    History of kidney stones    Hyperlipidemia    Hypertension    NSTEMI (non-ST elevated myocardial infarction) (HCC) 12/2014   Peripheral vascular disease (HCC)     Past Surgical History:  Procedure Laterality Date   ABDOMINAL AORTAGRAM N/A 12/18/2012   Procedure: ABDOMINAL EZELLA;  Surgeon: Redell LITTIE Laurence, MD;  Location: [facility] CATH LAB;  Service: Cardiovascular;  Laterality: N/A;   AORTA - BILATERAL FEMORAL ARTERY BYPASS GRAFT N/A 01/07/2013   Procedure: AORTA BIFEMORAL BYPASS GRAFT;  Surgeon: Redell LITTIE Laurence, MD;  Location: [facility] OR;  Service: Vascular;  Laterality: N/A;   CARDIAC CATHETERIZATION N/A 01/06/2015   Procedure: Left Heart Cath and Coronary Angiography;  Surgeon: Lonni JONETTA Cash,  MD;  Location: [facility] INVASIVE CV LAB;  Service: Cardiovascular;  Laterality: N/A;   CARDIAC CATHETERIZATION N/A 01/06/2015   Procedure: Coronary Stent Intervention;  Surgeon: Lonni JONETTA Cash, MD;  Location: [facility] INVASIVE CV LAB;  Service: Cardiovascular: Xience DES 3.5 mm x 33 mm p-mLAD   CARDIAC CATHETERIZATION N/A 07/02/2015   Procedure: Left Heart Cath and Coronary Angiography;  Surgeon: Alm LELON Clay, MD;  Location: [facility] INVASIVE CV LAB;  Service: Cardiovascular;  Laterality: N/A;   CARDIAC CATHETERIZATION N/A 07/02/2015   Procedure: Coronary Stent Intervention;  Surgeon: Alm LELON Clay, MD;  Location: [facility] INVASIVE CV LAB;  Service: Cardiovascular;  Laterality: N/A;   CORONARY PRESSURE/FFR STUDY N/A 03/04/2020   Procedure: INTRAVASCULAR PRESSURE WIRE/FFR STUDY;  Surgeon: Clay Alm LELON, MD;  Location: [facility] INVASIVE CV LAB;  Service: Cardiovascular;  Laterality: N/A;   ELBOW SURGERY Bilateral    KIDNEY STONE SURGERY     x2   LEFT HEART CATH AND CORONARY ANGIOGRAPHY N/A 03/04/2020   Procedure: LEFT HEART CATH AND CORONARY ANGIOGRAPHY;  Surgeon: Clay Alm LELON, MD;  Location: [facility] INVASIVE CV LAB;  Service: Cardiovascular;  Laterality: N/A;    Social History   Socioeconomic History   Marital status: Legally Separated    Spouse name: Not on file   Number of children: Not on file   Years of education: Not on file   Highest education level: Not on file  Occupational  History   Not on file  Tobacco Use   Smoking status: Every Day    Current packs/day: 0.00    Average packs/day: 1 pack/day for 30.0 years (30.0 ttl pk-yrs)    Types: Cigarettes, E-cigarettes    Last attempt to quit: 2024    Years since quitting: 1.0   Smokeless tobacco: Never  Vaping Use   Vaping status: Never Used  Substance and Sexual Activity   Alcohol use: No    Comment: occasional   Drug use: No   Sexual activity: Not on file  Other Topics Concern   Not on file  Social History Narrative   Not on file   Social  Drivers of Health   Financial Resource Strain: Not on file  Food Insecurity: Not on file  Transportation Needs: Not on file  Physical Activity: Not on file  Stress: Not on file  Social Connections: Not on file  Intimate Partner Violence: Not on file    Family History  Problem Relation Age of Onset   Heart disease Mother        NOT  before age 23   Heart attack Mother    Hyperlipidemia Mother    Heart disease Maternal Grandmother    Cancer Paternal Grandfather    Heart disease Father        NOT  before age 65   Heart attack Father    Hyperlipidemia Father     Current Outpatient Medications  Medication Sig Dispense Refill   acetaminophen  (TYLENOL ) 500 MG tablet Take 1,000 mg by mouth every 8 (eight) hours as needed for moderate pain.     aspirin  EC 81 MG tablet Take 1 tablet (81 mg total) by mouth daily. Swallow whole.     atorvastatin  (LIPITOR ) 80 MG tablet TAKE 1 TABLET BY MOUTH EVERY DAY 90 tablet 0   metoprolol  succinate (TOPROL -XL) 25 MG 24 hr tablet Take 0.5 tablets (12.5 mg total) by mouth daily. 45 tablet 3   nitroGLYCERIN  (NITROSTAT ) 0.4 MG SL tablet Place 1 tablet (0.4 mg total) under the tongue every 5 (five) minutes as needed for chest pain (x 3 doses). 25 tablet 2   pantoprazole  (PROTONIX ) 40 MG tablet Take 1 tablet by mouth every morning.     potassium chloride  (KLOR-CON ) 10 MEQ tablet Take 1 tablet (10 mEq total) by mouth daily. 90 tablet 3   ranolazine  (RANEXA ) 500 MG 12 hr tablet Take 1 tablet (500 mg total) by mouth 2 (two) times daily. 90 tablet 3   sertraline  (ZOLOFT ) 100 MG tablet Take 1 tablet (100 mg total) by mouth daily. Patient will have to get future refills from PCP. Provider will no longer be in office. 45 tablet 0   sildenafil (REVATIO) 20 MG tablet Take 60 mg by mouth daily as needed (ED).     ticagrelor  (BRILINTA ) 60 MG TABS tablet TAKE 1 TABLET (60 MG TOTAL) BY MOUTH 2 (TWO) TIMES DAILY. PLEASE KEEP UPCOMING APPOINTMENT FOR FUTURE REFILLS. THANK YOU  60 tablet 11   No current facility-administered medications for this visit.    Allergies  Allergen Reactions   Simvastatin Nausea Only    Stomach aches     REVIEW OF SYSTEMS:   [X]  denotes positive finding, [ ]  denotes negative finding Cardiac  Comments:  Chest pain or chest pressure:    Shortness of breath upon exertion:    Short of breath when lying flat:    Irregular heart rhythm:        Vascular  Pain in calf, thigh, or hip brought on by ambulation:    Pain in feet at night that wakes you up from your sleep:     Blood clot in your veins:    Leg swelling:         Pulmonary    Oxygen at home:    Productive cough:     Wheezing:         Neurologic    Sudden weakness in arms or legs:     Sudden numbness in arms or legs:     Sudden onset of difficulty speaking or slurred speech:    Temporary loss of vision in one eye:     Problems with dizziness:         Gastrointestinal    Blood in stool:     Vomited blood:         Genitourinary    Burning when urinating:     Blood in urine:        Psychiatric    Major depression:         Hematologic    Bleeding problems:    Problems with blood clotting too easily:        Skin    Rashes or ulcers:        Constitutional    Fever or chills:      PHYSICAL EXAMINATION:  Vitals:   03/06/23 0851  BP: 129/84  Pulse: (!) 54  Resp: 18  Temp: 97.8 F (36.6 C)  TempSrc: Temporal  SpO2: 96%  Weight: 189 lb 4.8 oz (85.9 kg)  Height: 5' 9 (1.753 m)    General:  WDWN in NAD; vital signs documented above Gait: Not observed HENT: WNL, normocephalic Pulmonary: normal non-labored breathing , without Rales, rhonchi,  wheezing Cardiac: regular HR Abdomen: soft, NT, no masses Skin: without rashes Vascular Exam/Pulses: palpable R DP and PT; absent L pedal pulses Extremities: without ischemic changes, without Gangrene , without cellulitis; without open wounds;  Musculoskeletal: no muscle wasting or atrophy  Neurologic:  A&O X 3; CN grossly intact Psychiatric:  The pt has Normal affect.   Non-Invasive Vascular Imaging:   Bilateral ICA 40 to 59% stenosis  ABI/TBIToday's ABIToday's TBIPrevious ABIPrevious TBI  +-------+-----------+-----------+------------+------------+  Right 0.98       0.70       1.08        0.77          +-------+-----------+-----------+------------+------------+  Left  0.69       0.49       0.90        0.64          +-------+-----------+-----------+------------+------------+     ASSESSMENT/PLAN:: 58 y.o. male here for follow up for surveillance of PAD and carotid artery stenosis  Subjectively he is without rest pain or claudication.  He did experience a drop in left ABI and TBI since last office visit however this is asymptomatic.  No indication for further workup at this time.  Right leg is well-perfused with palpable pedal pulses.  We will repeat ABIs in 1 year.  I encouraged him to continue to walk is much as possible for exercise.  He is also followed for carotid artery stenosis.  We were unable to replicate right ICA stenosis which was found to be between 60 and 79% in 2022.  He is now in the 40 to 59% stenosis range for bilateral internal carotid arteries.  No indication for revascularization or further workup for asymptomatic stenosis.  We  will also repeat carotid duplex in 1 year.  He will continue his aspirin , Brilinta , statin on a daily basis.   Donnice Sender, PA-C [facility] [phone]  Clinic MD:   Sheree

## 2023-03-11 ENCOUNTER — Other Ambulatory Visit: Payer: Self-pay

## 2023-03-11 ENCOUNTER — Emergency Department (HOSPITAL_COMMUNITY): Payer: BC Managed Care – PPO

## 2023-03-11 ENCOUNTER — Emergency Department (HOSPITAL_COMMUNITY)
Admission: EM | Admit: 2023-03-11 | Discharge: 2023-03-11 | Discharge status: Against Medical Advice | Payer: BC Managed Care – PPO | Attending: Emergency Medicine | Admitting: Emergency Medicine

## 2023-03-11 DIAGNOSIS — Z79899 Other long term (current) drug therapy: Secondary | ICD-10-CM | POA: Diagnosis not present

## 2023-03-11 DIAGNOSIS — Z7982 Long term (current) use of aspirin: Secondary | ICD-10-CM | POA: Diagnosis not present

## 2023-03-11 DIAGNOSIS — Z20822 Contact with and (suspected) exposure to covid-19: Secondary | ICD-10-CM | POA: Insufficient documentation

## 2023-03-11 DIAGNOSIS — J101 Influenza due to other identified influenza virus with other respiratory manifestations: Secondary | ICD-10-CM | POA: Diagnosis not present

## 2023-03-11 DIAGNOSIS — I10 Essential (primary) hypertension: Secondary | ICD-10-CM | POA: Insufficient documentation

## 2023-03-11 DIAGNOSIS — R079 Chest pain, unspecified: Secondary | ICD-10-CM

## 2023-03-11 LAB — COMPREHENSIVE METABOLIC PANEL
ALT: 30 U/L (ref 0–44)
AST: 34 U/L (ref 15–41)
Albumin: 3.8 g/dL (ref 3.5–5.0)
Alkaline Phosphatase: 71 U/L (ref 38–126)
Anion gap: 17 — ABNORMAL HIGH (ref 5–15)
BUN: 16 mg/dL (ref 6–20)
CO2: 22 mmol/L (ref 22–32)
Calcium: 9.4 mg/dL (ref 8.9–10.3)
Chloride: 97 mmol/L — ABNORMAL LOW (ref 98–111)
Creatinine, Ser: 1.21 mg/dL (ref 0.61–1.24)
GFR, Estimated: >60 mL/min (ref >60)
Glucose, Bld: 129 mg/dL — ABNORMAL HIGH (ref 70–99)
Potassium: 4.5 mmol/L (ref 3.5–5.1)
Sodium: 136 mmol/L (ref 135–145)
Total Bilirubin: 1.1 mg/dL (ref 0.0–1.2)
Total Protein: 7.2 g/dL (ref 6.5–8.1)

## 2023-03-11 LAB — CBC
HCT: 51.5 % (ref 39.0–52.0)
Hemoglobin: 16.6 g/dL (ref 13.0–17.0)
MCH: 30.5 pg (ref 26.0–34.0)
MCHC: 32.2 g/dL (ref 30.0–36.0)
MCV: 94.7 fL (ref 80.0–100.0)
Platelets: 165 10*3/uL (ref 150–400)
RBC: 5.44 MIL/uL (ref 4.22–5.81)
RDW: 13.5 % (ref 11.5–15.5)
WBC: 8.9 10*3/uL (ref 4.0–10.5)
nRBC: 0 % (ref 0.0–0.2)

## 2023-03-11 LAB — RESP PANEL BY RT-PCR (RSV, FLU A&B, COVID)  RVPGX2
Influenza A by PCR: POSITIVE — AB
Influenza B by PCR: NEGATIVE
Resp Syncytial Virus by PCR: NEGATIVE
SARS Coronavirus 2 by RT PCR: NEGATIVE

## 2023-03-11 LAB — TROPONIN I (HIGH SENSITIVITY): Troponin I (High Sensitivity): 8 ng/L (ref <18)

## 2023-03-11 MED ORDER — ALBUTEROL SULFATE HFA 108 (90 BASE) MCG/ACT IN AERS
2.0000 | INHALATION_SPRAY | RESPIRATORY_TRACT | Status: DC | PRN
  Administered 2023-03-11: 2 via RESPIRATORY_TRACT
  Filled 2023-03-11: qty 6.7

## 2023-03-11 NOTE — ED Provider Notes (Signed)
[place] EMERGENCY DEPARTMENT AT [facility] Provider Note   CSN: [id number] Arrival date & time: 03/11/23  1637     History {Add pertinent medical, surgical, social history, OB history to HPI:1} Chief Complaint  Patient presents with   Cough    Tommy Malone. is a 58 y.o. male.  58 year old male with a history of CAD status post PCI, hypertension, and hyperlipidemia presents emergency department with URI symptoms and chest discomfort.  Patient reports that since Thursday he has been having URI symptoms including cough, shortness of breath, body aches, chills.  Today at 2:30 PM started noticing that he is having chest discomfort.  Feels like reflux.  No radiation.  Currently is gone.  Does have a history of MI and says that it feels similar to prior events.  No vomiting.  No diaphoresis.       Home Medications Prior to Admission medications   Medication Sig Start Date End Date Taking? Authorizing Provider  acetaminophen (TYLENOL) 500 MG tablet Take 1,000 mg by mouth every 8 (eight) hours as needed for moderate pain.    [provider]  aspirin EC 81 MG tablet Take 1 tablet (81 mg total) by mouth daily. Swallow whole. 09/13/22   Gaston Islam., NP  atorvastatin (LIPITOR) 80 MG tablet TAKE 1 TABLET BY MOUTH EVERY DAY 02/28/22   Pricilla Riffle, MD  metoprolol succinate (TOPROL-XL) 25 MG 24 hr tablet Take 0.5 tablets (12.5 mg total) by mouth daily. 09/25/22   Pricilla Riffle, MD  nitroGLYCERIN (NITROSTAT) 0.4 MG SL tablet Place 1 tablet (0.4 mg total) under the tongue every 5 (five) minutes as needed for chest pain (x 3 doses). 02/23/19   Rosalio Macadamia, NP  pantoprazole (PROTONIX) 40 MG tablet Take 1 tablet by mouth every morning. 05/06/20   [provider]  potassium chloride (KLOR-CON) 10 MEQ tablet Take 1 tablet (10 mEq total) by mouth daily. 10/11/22   Gaston Islam., NP  ranolazine (RANEXA) 500 MG 12 hr tablet Take 1 tablet (500 mg total) by mouth 2  (two) times daily. 02/06/23   Wendall Stade, MD  sertraline (ZOLOFT) 100 MG tablet Take 1 tablet (100 mg total) by mouth daily. Patient will have to get future refills from PCP. Provider will no longer be in office. 03/09/20   Rosalio Macadamia, NP  sildenafil (REVATIO) 20 MG tablet Take 60 mg by mouth daily as needed (ED).    [provider]  ticagrelor (BRILINTA) 60 MG TABS tablet TAKE 1 TABLET (60 MG TOTAL) BY MOUTH 2 (TWO) TIMES DAILY. PLEASE KEEP UPCOMING APPOINTMENT FOR FUTURE REFILLS. THANK YOU 09/21/22   Pricilla Riffle, MD      Allergies    Simvastatin    Review of Systems   Review of Systems  Physical Exam Updated Vital Signs BP (!) 132/92 (BP Location: Right Arm)   Pulse 94   Temp 98.1 F (36.7 C) (Axillary)   Resp (!) 25   SpO2 99%  Physical Exam Vitals and nursing note reviewed.  Constitutional:      General: He is not in acute distress.    Appearance: He is well-developed.  HENT:     Head: Normocephalic and atraumatic.     Right Ear: External ear normal.     Left Ear: External ear normal.     Nose: Nose normal.  Eyes:     Extraocular Movements: Extraocular movements intact.     Conjunctiva/sclera:  Conjunctivae normal.     Pupils: Pupils are equal, round, and reactive to light.  Cardiovascular:     Rate and Rhythm: Normal rate and regular rhythm.     Heart sounds: Normal heart sounds.     Comments: Radial pulses 2+ bilaterally Pulmonary:     Effort: Pulmonary effort is normal. No respiratory distress.     Breath sounds: Normal breath sounds.  Musculoskeletal:     Cervical back: Normal range of motion and neck supple.  Skin:    General: Skin is warm and dry.  Neurological:     Mental Status: He is alert. Mental status is at baseline.  Psychiatric:        Mood and Affect: Mood normal.        Behavior: Behavior normal.     ED Results / Procedures / Treatments   Labs (all labs ordered are listed, but only abnormal results are displayed) Labs  Reviewed  RESP PANEL BY RT-PCR (RSV, FLU A&B, COVID)  RVPGX2 - Abnormal; Notable for the following components:      Result Value   Influenza A by PCR POSITIVE (*)    All other components within normal limits  COMPREHENSIVE METABOLIC PANEL - Abnormal; Notable for the following components:   Chloride 97 (*)    Glucose, Bld 129 (*)    Anion gap 17 (*)    All other components within normal limits  CBC  TROPONIN I (HIGH SENSITIVITY)    EKG EKG Interpretation Date/Time:  Monday March 11 2023 16:41:43 EST Ventricular Rate:  101 PR Interval:  148 QRS Duration:  82 QT Interval:  330 QTC Calculation: 427 R Axis:   -27  Text Interpretation: Sinus tachycardia Septal infarct , age undetermined Abnormal ECG Confirmed by Vonita Moss [phone]) on 03/11/2023 7:53:26 PM  Radiology DG Chest 2 View Result Date: 03/11/2023 CLINICAL DATA:  Cough, shortness of breath and body aches. EXAM: CHEST - 2 VIEW COMPARISON:  July 02, 2015 FINDINGS: The heart size and mediastinal contours are within normal limits. Both lungs are clear. The visualized skeletal structures are unremarkable. IMPRESSION: No active cardiopulmonary disease. Electronically Signed   By: Aram Candela M.D.   On: 03/11/2023 18:36    Procedures Procedures  {Document cardiac monitor, telemetry assessment procedure when appropriate:1}  Medications Ordered in ED Medications  albuterol (VENTOLIN HFA) 108 (90 Base) MCG/ACT inhaler 2 puff (2 puffs Inhalation Given 03/11/23 1843)    ED Course/ Medical Decision Making/ A&P   {   Click here for ABCD2, HEART and other calculatorsREFRESH Note before signing :1}                              Medical Decision Making Amount and/or Complexity of Data Reviewed Labs: ordered. Radiology: ordered.  Risk Prescription drug management.   Tommy Malone. is a 58 y.o. male with comorbidities that complicate the patient evaluation including CAD status post PCI, hypertension, and  hyperlipidemia who presents to the emergency department with several days of URI symptoms and several hours of chest discomfort  Initial Ddx:  URI, pneumonia, MI, PE  MDM/Course:  Patient presents to the emergency department with several days of URI symptoms.  He on exam.  Upon re-evaluation ***  This patient presents to the ED for concern of complaints listed in HPI, this involves an extensive number of treatment options, and is a complaint that carries with it a high risk of complications and  morbidity. Disposition including potential need for admission considered.   Dispo: {Disposition:28069}  Additional history obtained from {Additional History:28067} Records reviewed {Records Reviewed:28068} The following labs were independently interpreted: {labs interpreted:28064} and show {lab findings:28250} I independently reviewed the following imaging with scope of interpretation limited to determining acute life threatening conditions related to emergency care: {imaging interpreted:28065} and agree with the radiologist interpretation with the following exceptions: none I personally reviewed and interpreted cardiac monitoring: {cardiac monitoring:28251} I personally reviewed and interpreted the pt's EKG: see above for interpretation  I have reviewed the patients home medications and made adjustments as needed Consults: {Consultants:28063} Social Determinants of health:  ***  Portions of this note were generated with Scientist, clinical (histocompatibility and immunogenetics). Dictation errors may occur despite best attempts at proofreading.    {Document your decision making why or why not admission, treatments were needed:1} Final Clinical Impression(s) / ED Diagnoses Final diagnoses:  None    Rx / DC Orders ED Discharge Orders     None

## 2023-03-11 NOTE — ED Notes (Signed)
 Patient leaving AMA per Dr Efraim Grange, patient provided discharge paperwork and instructions, patient in no distress at this time.

## 2023-03-11 NOTE — Discharge Instructions (Signed)
 You were seen for your upper respiratory tract infection in the emergency department.   At home, please use Tylenol  for your muscle aches and fevers.  Please use over-the-counter cough medication or tea with honey for your cough.  Follow-up with your primary doctor in 2-3 days regarding your visit.  This may be over the phone. Please schedule an appointment with your cardiologist if you have more chest pain.   Return immediately to the emergency department if you experience any of the following: chest pain, difficulty breathing, or any other concerning symptoms.    Thank you for visiting our Emergency Department. It was a pleasure taking care of you today.

## 2023-03-11 NOTE — ED Triage Notes (Signed)
 PT from home via [facility] with reports of cough, SHOB, body aches, and chest pain. PT reports coworkers have flu.

## 2023-08-01 ENCOUNTER — Other Ambulatory Visit: Payer: Self-pay | Admitting: Cardiovascular Disease

## 2023-09-24 ENCOUNTER — Other Ambulatory Visit: Payer: Self-pay | Admitting: Nurse Practitioner

## 2023-09-24 ENCOUNTER — Other Ambulatory Visit: Payer: Self-pay | Admitting: Internal Medicine

## 2023-10-22 ENCOUNTER — Other Ambulatory Visit: Payer: Self-pay | Admitting: Nurse Practitioner

## 2023-10-22 ENCOUNTER — Other Ambulatory Visit: Payer: Self-pay | Admitting: Internal Medicine

## 2023-10-26 ENCOUNTER — Other Ambulatory Visit: Payer: Self-pay | Admitting: Internal Medicine

## 2023-11-04 ENCOUNTER — Other Ambulatory Visit: Payer: Self-pay | Admitting: Internal Medicine

## 2023-11-06 ENCOUNTER — Other Ambulatory Visit: Payer: Self-pay | Admitting: Internal Medicine

## 2023-11-15 ENCOUNTER — Other Ambulatory Visit: Payer: Self-pay | Admitting: Nurse Practitioner

## 2023-11-17 ENCOUNTER — Other Ambulatory Visit: Payer: Self-pay | Admitting: Internal Medicine

## 2023-11-24 ENCOUNTER — Other Ambulatory Visit: Payer: Self-pay | Admitting: Internal Medicine

## 2023-11-27 ENCOUNTER — Other Ambulatory Visit: Payer: Self-pay | Admitting: Internal Medicine

## 2023-11-30 ENCOUNTER — Other Ambulatory Visit: Payer: Self-pay | Admitting: Internal Medicine

## 2023-12-05 ENCOUNTER — Telehealth: Payer: Self-pay | Admitting: Internal Medicine

## 2023-12-05 MED ORDER — RANOLAZINE ER 500 MG PO TB12: 500.0000 mg | ORAL_TABLET | Freq: Two times a day (BID) | ORAL | 0 refills | Status: DC

## 2023-12-05 MED ORDER — TICAGRELOR 60 MG PO TABS: 60.0000 mg | ORAL_TABLET | Freq: Two times a day (BID) | ORAL | 0 refills | Status: DC

## 2023-12-05 NOTE — Telephone Encounter (Signed)
 Pt scheduled to see Dr. Okey 12/10/23.  15 day refill for Brilinta  and Ranexa  has [facility]

## 2023-12-05 NOTE — Telephone Encounter (Signed)
*  STAT* If patient is at the pharmacy, call can be transferred to refill team.   1. Which medications need to be refilled? (please list name of each medication and dose if known)   ticagrelor  (BRILINTA ) 60 MG TABS tablet  ranolazine  (RANEXA ) 500 MG 12 hr tablet    2. Would you like to learn more about the convenience, safety, & potential cost savings by using the [facility]? no    3. Are you open to using the [facility] (Type [facility].  ).no   4. Which pharmacy/location (including street and city if local pharmacy) is medication to be sent to? [id number] [place], [place] - [address]     5. Do they need a 30 day or 90 day supply? 90 day if possible   Pt is out of medication and has upcoming appt 12/10/23

## 2023-12-08 ENCOUNTER — Other Ambulatory Visit: Payer: Self-pay | Admitting: Internal Medicine

## 2023-12-10 ENCOUNTER — Ambulatory Visit: Attending: Internal Medicine | Admitting: Internal Medicine

## 2023-12-10 ENCOUNTER — Encounter: Payer: Self-pay | Admitting: Internal Medicine

## 2023-12-10 VITALS — BP 140/70 | HR 57 | Ht 69.0 in | Wt 191.2 lb

## 2023-12-10 DIAGNOSIS — E785 Hyperlipidemia, unspecified: Secondary | ICD-10-CM | POA: Diagnosis not present

## 2023-12-10 DIAGNOSIS — Z9861 Coronary angioplasty status: Secondary | ICD-10-CM | POA: Diagnosis not present

## 2023-12-10 DIAGNOSIS — I2510 Atherosclerotic heart disease of native coronary artery without angina pectoris: Secondary | ICD-10-CM | POA: Diagnosis not present

## 2023-12-10 LAB — CBC

## 2023-12-10 MED ORDER — TICAGRELOR 60 MG PO TABS: 60.0000 mg | ORAL_TABLET | Freq: Two times a day (BID) | ORAL | 3 refills | Status: AC

## 2023-12-10 MED ORDER — RANOLAZINE ER 500 MG PO TB12: 500.0000 mg | ORAL_TABLET | Freq: Two times a day (BID) | ORAL | 3 refills | Status: AC

## 2023-12-10 NOTE — Progress Notes (Unsigned)
 Cardiology Office Note   Date:  12/10/2023  ID:  Tommy Malone., DOB [birth year], MRN [id number]  PCP:  Rexanne Ingle, MD  Cardiologist:   Vina Gull, MD   F/U of CAD      History of Present Illness: Tommy Malone. is a 58 y.o. male with a  history severe CAD, PAD (s/p aortobifem bypass 12/2012),   2018  LHC showed 99% proximal LAD lesion treated with DES (Xience 3.5 x 33 mm DES), 60% D2 stenosis, 70% proximal to mid LAD lesion covered with same stent, 50% mid RCA lesion, 30% distal RCA lesion, EF 45-50%.  Echocardiogram was obtained on 12/2016 which showed EF 55-60%    Jan 2022   Pt  complained of chest tightness/SOB  Went on to have LHC:   SUMMARY Severe native CAD involving RCA and LAD: New 100% CTO of ostial RCA with extensive L-R collaterals from  2stSep, 60% ISR of mid LAD (DFR/FFR negative) with jailed 2ndDiag ostial 40% stenosis & major 2stSep 80%; moderate proximal LCx 55% (DFR 0.95) DFR prox LCx - 0.95 (not significant) DFR mid LAD ISR 0.93 --FFR 0.82 (borderline, but nonsignificant) The LAD beyond the stented segment does appear to go some myocardial with myocardial bridging noted at the distal exit point Mild to moderately reduced LVEF of roughly 45% with inferior and apical hypokinesis -> would recommend 2D echocardiogram to better evaluate.. Suspect likely culprit angina is the occluded RCA being filled via collaterals from a heavily diseased ostial major 2st Sep branch.    I saw the patient in 2023  He was seen by FORBES Alberts inAug 2024    Since seen he says he is doing pretty good  Does have some SOB with activity but he says it is unchanged from 1 year ago   Denies CP    Walks a lot at work    Denies dizziness  No palpitations    BP at home usually 110s to 120s   Still not smoking (quit about 3 years)    Admits to eating lots of processed foods and sweetened drinks     Outpatient Medications Prior to Visit  Medication Sig Dispense Refill   acetaminophen   (TYLENOL ) 500 MG tablet Take 1,000 mg by mouth every 8 (eight) hours as needed for moderate pain.     aspirin  EC 81 MG tablet Take 1 tablet (81 mg total) by mouth daily. Swallow whole.     atorvastatin  (LIPITOR ) 80 MG tablet TAKE 1 TABLET BY MOUTH EVERY DAY 90 tablet 0   metoprolol  succinate (TOPROL -XL) 25 MG 24 hr tablet TAKE 1/2 TABLET BY MOUTH DAILY 15 tablet 0   nitroGLYCERIN  (NITROSTAT ) 0.4 MG SL tablet Place 1 tablet (0.4 mg total) under the tongue every 5 (five) minutes as needed for chest pain (x 3 doses). 25 tablet 2   pantoprazole  (PROTONIX ) 40 MG tablet Take 1 tablet by mouth every morning.     potassium chloride  (KLOR-CON ) 10 MEQ tablet TAKE 1 TABLET BY MOUTH EVERY DAY 90 tablet 1   ranolazine  (RANEXA ) 500 MG 12 hr tablet Take 1 tablet (500 mg total) by mouth 2 (two) times daily. 30 tablet 0   sertraline  (ZOLOFT ) 100 MG tablet Take 1 tablet (100 mg total) by mouth daily. Patient will have to get future refills from PCP. Provider will no longer be in office. 45 tablet 0   sildenafil (REVATIO) 20 MG tablet Take 60 mg by mouth daily as needed (ED).  ticagrelor  (BRILINTA ) 60 MG TABS tablet Take 1 tablet (60 mg total) by mouth 2 (two) times daily. 30 tablet 0   No facility-administered medications prior to visit.     Allergies:   Simvastatin   Past Medical History:  Diagnosis Date   Acid reflux    Anxiety    CAD S/P percutaneous coronary angioplasty 01/06/2015   a. NSTEMI - Xience DES 3.5 x 33 p-mLAD; 60% D1, 50% mRCA   b. 95% ost D2 treated with suboptimal PTCA with 55% residual stenosis, 55% mid LAD, 55% mid LAD ISR treated with PTCA, 99% ost 1st septal perforator, 50% mid RCA // Echocardiogram 6/22: EF 67, no RWMA, normal RVSF, mild RAE, trivial MR   Depression    Hemorrhoid    History of kidney stones    Hyperlipidemia    Hypertension    NSTEMI (non-ST elevated myocardial infarction) (HCC) 12/2014   Peripheral vascular disease     Past Surgical History:  Procedure  Laterality Date   ABDOMINAL AORTAGRAM N/A 12/18/2012   Procedure: ABDOMINAL EZELLA;  Surgeon: Redell LITTIE Door, MD;  Location[facility] CATH LAB;  Service: Cardiovascular;  Laterality: N/A;   AORTA - BILATERAL FEMORAL ARTERY BYPASS GRAFT N/A 01/07/2013   Procedure: AORTA BIFEMORAL BYPASS GRAFT;  Surgeon: Redell LITTIE Door, MD;  Location: [facility] OR;  Service: Vascular;  Laterality: N/A;   CARDIAC CATHETERIZATION N/A 01/06/2015   Procedure: Left Heart Cath and Coronary Angiography;  Surgeon: Lonni JONETTA Cash, MD;  Location: [facility] INVASIVE CV LAB;  Service: Cardiovascular;  Laterality: N/A;   CARDIAC CATHETERIZATION N/A 01/06/2015   Procedure: Coronary Stent Intervention;  Surgeon: Lonni JONETTA Cash, MD;  Location: [facility] INVASIVE CV LAB;  Service: Cardiovascular: Xience DES 3.5 mm x 33 mm p-mLAD   CARDIAC CATHETERIZATION N/A 07/02/2015   Procedure: Left Heart Cath and Coronary Angiography;  Surgeon: Alm LELON Clay, MD;  Location: [facility] INVASIVE CV LAB;  Service: Cardiovascular;  Laterality: N/A;   CARDIAC CATHETERIZATION N/A 07/02/2015   Procedure: Coronary Stent Intervention;  Surgeon: Alm LELON Clay, MD;  Location: [facility] INVASIVE CV LAB;  Service: Cardiovascular;  Laterality: N/A;   CORONARY PRESSURE/FFR STUDY N/A 03/04/2020   Procedure: INTRAVASCULAR PRESSURE WIRE/FFR STUDY;  Surgeon: Clay Alm LELON, MD;  Location: [facility] INVASIVE CV LAB;  Service: Cardiovascular;  Laterality: N/A;   ELBOW SURGERY Bilateral    KIDNEY STONE SURGERY     x2   LEFT HEART CATH AND CORONARY ANGIOGRAPHY N/A 03/04/2020   Procedure: LEFT HEART CATH AND CORONARY ANGIOGRAPHY;  Surgeon: Clay Alm LELON, MD;  Location: [facility] INVASIVE CV LAB;  Service: Cardiovascular;  Laterality: N/A;     Social History:  The patient  reports that he has been smoking cigarettes and e-cigarettes. He has a 30 pack-year smoking history. He has never used smokeless tobacco. He reports that he does not drink alcohol and does not use drugs.   Family History:  The  patient's family history includes Cancer in his paternal grandfather; Heart attack in his father and mother; Heart disease in his father, maternal grandmother, and mother; Hyperlipidemia in his father and mother.    ROS:  Please see the history of present illness. All other systems are reviewed and  Negative to the above problem except as noted.    PHYSICAL EXAM: VS:  BP (!) 140/70 (BP Location: Left Arm, Patient Position: Sitting, Cuff Size: Normal)   Pulse (!) 57   Ht 5' 9 (1.753 m)   Wt 191 lb 3.2 oz (86.7 kg)  SpO2 98%   BMI 28.24 kg/m   GEN: Well nourished, well developed, in no acute distress  HEENT: normal  Neck: no JVD, carotid bruits Cardiac: RRR; no murmurs, Respiratory:  clear to auscultation GI: soft, nontender, No hepatomegaly  Ext  No LE edema   EKG:  EKG not done today   Lipid Panel    Component Value Date/Time   CHOL 143 02/23/2020 1211   TRIG 176 (H) 02/23/2020 1211   HDL 37 (L) 02/23/2020 1211   CHOLHDL 3.9 02/23/2020 1211   CHOLHDL 3.4 07/03/2015 0530   VLDL 14 07/03/2015 0530   LDLCALC 76 02/23/2020 1211      Wt Readings from Last 3 Encounters:  12/10/23 191 lb 3.2 oz (86.7 kg)  03/06/23 189 lb 4.8 oz (85.9 kg)  09/13/22 186 lb 12.8 oz (84.7 kg)      ASSESSMENT AND PLAN:  1  CAD Pt with extensive severe CAD (see above)   He is doing well with medical therapy     I would keep on current regimen       2.  PAD  Last ABIs in APril 2021 were mildly abnormal on L   Normal R   Carotid USN in 04/2019:  mod plaquing bilaterally    Will  follow with periodic USNs  3.  HL  Last lipid panel in 2024 was not good   It had been better previously   LDL 160 Will repeat Get NMR, APo B and Lpa       4 Metabolics   Reviewed diet   Cut out SSB and highly processed foods   Check A1C  5 Tobacco  Congratulated on continued abstinence      F/U 1 year     Signed, Vina Gull, MD

## 2023-12-10 NOTE — Patient Instructions (Signed)
 Medication Instructions:  Your physician recommends that you continue on your current medications as directed. Please refer to the Current Medication list given to you today.  *If you need a refill on your cardiac medications before your next appointment, please call your pharmacy*  Lab Work: TODAY: NMR, Apo B, LP(a), BMET, CBC, Hgb A1c If you have labs (blood work) drawn today and your tests are completely normal, you will receive your results only by: MyChart Message (if you have MyChart) OR A paper copy in the mail If you have any lab test that is abnormal or we need to change your treatment, we will call you to review the results.  Follow-Up: At [facility], you and your health needs are our priority.  As part of our continuing mission to provide you with exceptional heart care, our providers are all part of one team.  This team includes your primary Cardiologist (physician) and Advanced Practice Providers or APPs (Physician Assistants and Nurse Practitioners) who all work together to provide you with the care you need, when you need it.  Your next appointment:   1 year(s)  Provider:   Vina Gull, MD  We recommend signing up for the patient portal called MyChart.  Sign up information is provided on this After Visit Summary.  MyChart is used to connect with patients for Virtual Visits (Telemedicine).  Patients are able to view lab/test results, encounter notes, upcoming appointments, etc.  Non-urgent messages can be sent to your provider as well.    To learn more about what you can do with MyChart, go to [email or web address].   Other Instructions

## 2023-12-12 LAB — BASIC METABOLIC PANEL WITH GFR
BUN/Creatinine Ratio: 11 (ref 9–20)
BUN: 13 mg/dL (ref 6–24)
CO2: 24 mmol/L (ref 20–29)
Calcium: 9.3 mg/dL (ref 8.7–10.2)
Chloride: 102 mmol/L (ref 96–106)
Creatinine, Ser: 1.16 mg/dL (ref 0.76–1.27)
Glucose: 91 mg/dL (ref 70–99)
Potassium: 5.3 mmol/L — ABNORMAL HIGH (ref 3.5–5.2)
Sodium: 139 mmol/L (ref 134–144)
eGFR: 73 mL/min/1.73 (ref >59)

## 2023-12-12 LAB — CBC
Hematocrit: 45.8 % (ref 37.5–51.0)
Hemoglobin: 15.3 g/dL (ref 13.0–17.7)
MCH: 31.2 pg (ref 26.6–33.0)
MCHC: 33.4 g/dL (ref 31.5–35.7)
MCV: 93 fL (ref 79–97)
Platelets: 193 x10E3/uL (ref 150–450)
RBC: 4.91 x10E6/uL (ref 4.14–5.80)
RDW: 12.9 % (ref 11.6–15.4)
WBC: 6 x10E3/uL (ref 3.4–10.8)

## 2023-12-12 LAB — NMR, LIPOPROFILE
Cholesterol, Total: 265 mg/dL — AB (ref 100–199)
HDL Particle Number: 28.5 umol/L — AB (ref >=30.5)
HDL-C: 36 mg/dL — AB (ref >39)
LDL Particle Number: 2659 nmol/L — AB (ref <1000)
LDL Size: 20.1 nm — AB (ref >20.5)
LDL-C (NIH Calc): 196 mg/dL — AB (ref 0–99)
LP-IR Score: 67 — AB (ref <=45)
Small LDL Particle Number: 1815 nmol/L — AB (ref <=527)
Triglycerides: 172 mg/dL — AB (ref 0–149)

## 2023-12-12 LAB — APOLIPOPROTEIN B: Apolipoprotein B: 149 mg/dL — AB (ref <90)

## 2023-12-12 LAB — HEMOGLOBIN A1C
Est. average glucose Bld gHb Est-mCnc: 103 mg/dL
Hgb A1c MFr Bld: 5.2 % (ref 4.8–5.6)

## 2023-12-12 LAB — LIPOPROTEIN A (LPA): Lipoprotein (a): 105.9 nmol/L — AB (ref <75.0)

## 2023-12-14 ENCOUNTER — Ambulatory Visit: Payer: Self-pay | Admitting: Internal Medicine

## 2023-12-14 DIAGNOSIS — E785 Hyperlipidemia, unspecified: Secondary | ICD-10-CM

## 2023-12-14 DIAGNOSIS — Z79899 Other long term (current) drug therapy: Secondary | ICD-10-CM

## 2023-12-20 ENCOUNTER — Telehealth: Payer: Self-pay | Admitting: Pharmacy Technician

## 2023-12-20 ENCOUNTER — Other Ambulatory Visit (HOSPITAL_COMMUNITY): Payer: Self-pay

## 2023-12-20 ENCOUNTER — Other Ambulatory Visit: Payer: Self-pay

## 2023-12-20 DIAGNOSIS — E785 Hyperlipidemia, unspecified: Secondary | ICD-10-CM

## 2023-12-20 DIAGNOSIS — Z79899 Other long term (current) drug therapy: Secondary | ICD-10-CM

## 2023-12-20 NOTE — Telephone Encounter (Signed)
 Pharmacy Patient Advocate Encounter  Received notification from [employer] that Prior Authorization for repatha has been APPROVED from 12/20/23 to 01/28/2038. Ran test claim, Copay is $0.00. This test claim was processed through [facility]- copay amounts may vary at other pharmacies due to pharmacy/plan contracts, or as the patient moves through the different stages of their insurance plan.   PA #/Case ID/Reference #: [id number]

## 2023-12-20 NOTE — Telephone Encounter (Signed)
   Pharmacy Patient Advocate Encounter   Received notification from Pt Calls Messages that prior authorization for REPATHA is required/requested.   Insurance verification completed.   The patient is insured through [facility].   Per test claim: PA required; PA submitted to above mentioned insurance via Latent Key/confirmation #/EOC [id number] Status is pending
